# Patient Record
Sex: Male | Born: 1945
Health system: Southern US, Community
[De-identification: ages and names within clinical notes are randomized; demographics above are authoritative.]

## PROBLEM LIST (undated history)

## (undated) DIAGNOSIS — R251 Tremor, unspecified: Secondary | ICD-10-CM

## (undated) DIAGNOSIS — M1990 Unspecified osteoarthritis, unspecified site: Secondary | ICD-10-CM

## (undated) DIAGNOSIS — I10 Essential (primary) hypertension: Secondary | ICD-10-CM

## (undated) DIAGNOSIS — N400 Enlarged prostate without lower urinary tract symptoms: Secondary | ICD-10-CM

## (undated) DIAGNOSIS — G4733 Obstructive sleep apnea (adult) (pediatric): Secondary | ICD-10-CM

## (undated) DIAGNOSIS — K649 Unspecified hemorrhoids: Secondary | ICD-10-CM

## (undated) DIAGNOSIS — E782 Mixed hyperlipidemia: Secondary | ICD-10-CM

## (undated) DIAGNOSIS — I480 Paroxysmal atrial fibrillation: Secondary | ICD-10-CM

## (undated) DIAGNOSIS — N529 Male erectile dysfunction, unspecified: Secondary | ICD-10-CM

## (undated) DIAGNOSIS — C439 Malignant melanoma of skin, unspecified: Secondary | ICD-10-CM

## (undated) DIAGNOSIS — M1710 Unilateral primary osteoarthritis, unspecified knee: Secondary | ICD-10-CM

## (undated) DIAGNOSIS — I2510 Atherosclerotic heart disease of native coronary artery without angina pectoris: Secondary | ICD-10-CM

## (undated) DIAGNOSIS — E7800 Pure hypercholesterolemia, unspecified: Secondary | ICD-10-CM

## (undated) HISTORY — DX: Tremor, unspecified: R25.1

## (undated) HISTORY — PX: CARDIAC CATHETERIZATION: SHX172

## (undated) HISTORY — DX: Paroxysmal atrial fibrillation: I48.0

## (undated) HISTORY — DX: Male erectile dysfunction, unspecified: N52.9

## (undated) HISTORY — DX: Benign prostatic hyperplasia without lower urinary tract symptoms: N40.0

## (undated) HISTORY — DX: Essential (primary) hypertension: I10

## (undated) HISTORY — DX: Unspecified osteoarthritis, unspecified site: M19.90

## (undated) HISTORY — DX: Pure hypercholesterolemia, unspecified: E78.00

## (undated) HISTORY — PX: HYDROCELE EXCISION / REPAIR: SUR1145

## (undated) HISTORY — PX: OTHER SURGICAL HISTORY: SHX169

## (undated) HISTORY — DX: Malignant melanoma of skin, unspecified: C43.9

## (undated) HISTORY — DX: Unilateral primary osteoarthritis, unspecified knee: M17.10

## (undated) HISTORY — DX: Obstructive sleep apnea (adult) (pediatric): G47.33

## (undated) HISTORY — PX: HEMORROIDECTOMY: SUR656

## (undated) HISTORY — PX: CATARACT EXTRACTION, BILATERAL: SHX1313

## (undated) HISTORY — PX: LASIK: SHX215

## (undated) HISTORY — DX: Atherosclerotic heart disease of native coronary artery without angina pectoris: I25.10

## (undated) HISTORY — DX: Mixed hyperlipidemia: E78.2

---

## 1999-09-17 ENCOUNTER — Encounter: Payer: Self-pay | Admitting: Endocrinology

## 1999-09-17 ENCOUNTER — Ambulatory Visit (HOSPITAL_COMMUNITY): Admission: RE | Admit: 1999-09-17 | Discharge: 1999-09-17 | Payer: Self-pay | Admitting: Endocrinology

## 2003-06-21 ENCOUNTER — Ambulatory Visit (HOSPITAL_COMMUNITY): Admission: RE | Admit: 2003-06-21 | Discharge: 2003-06-21 | Payer: Self-pay | Admitting: Family Medicine

## 2005-05-31 ENCOUNTER — Ambulatory Visit (HOSPITAL_COMMUNITY): Admission: RE | Admit: 2005-05-31 | Discharge: 2005-05-31 | Payer: Self-pay | Admitting: Orthopedic Surgery

## 2005-08-02 ENCOUNTER — Ambulatory Visit: Payer: Self-pay | Admitting: Internal Medicine

## 2005-09-04 ENCOUNTER — Ambulatory Visit: Payer: Self-pay | Admitting: Internal Medicine

## 2005-10-05 ENCOUNTER — Emergency Department (HOSPITAL_COMMUNITY): Admission: EM | Admit: 2005-10-05 | Discharge: 2005-10-05 | Payer: Self-pay | Admitting: Emergency Medicine

## 2006-09-01 ENCOUNTER — Ambulatory Visit: Payer: Self-pay | Admitting: Pulmonary Disease

## 2007-05-22 ENCOUNTER — Ambulatory Visit (HOSPITAL_BASED_OUTPATIENT_CLINIC_OR_DEPARTMENT_OTHER): Admission: RE | Admit: 2007-05-22 | Discharge: 2007-05-22 | Payer: Self-pay | Admitting: Urology

## 2008-10-13 ENCOUNTER — Telehealth: Payer: Self-pay | Admitting: Gastroenterology

## 2009-03-20 ENCOUNTER — Encounter: Admission: RE | Admit: 2009-03-20 | Discharge: 2009-03-20 | Payer: Self-pay | Admitting: Family Medicine

## 2010-12-25 NOTE — Op Note (Signed)
NAME:  Cody Hodge, Cody Hodge NO.:  [id number]   MEDICAL RECORD NO.:  [id number]          PATIENT TYPE:  AMB   LOCATION:  [facility]                         FACILITY:  [facility]   PHYSICIAN:  Excell Seltzer. Annabell Howells, M.D.    DATE OF BIRTH:  04/27/1946   DATE OF PROCEDURE:  05/22/2007  DATE OF DISCHARGE:                               OPERATIVE REPORT   PROCEDURE:  Right hydrocelectomy.   PREOPERATIVE DIAGNOSIS:  Right hydrocele.   POSTOPERATIVE DIAGNOSIS:  Right hydrocele.   SURGEON:  Excell Seltzer. Annabell Howells, M.D.   ANESTHESIA:  MAC and local.   COMPLICATIONS:  None.   INDICATIONS:  Mr. Franta is a 65 year old white male with history of  symptomatic right hydrocele who has elected to undergo hydrocelectomy.   FINDINGS AND PROCEDURE:  The patient was taken to the operating room  where he was placed on the table in the supine position.  His scrotum  was shaved.  He was given IV sedation.  He was prepped with Betadine  solution and draped in the usual sterile fashion.  A cord block was  performed with 10 mL of 0.25% percent Marcaine and 1% lidocaine in a 3:2  mix.  He then underwent infiltration of the right anterior scrotal skin  in the area of incision with an additional 7 mL of lidocaine.   An oblique right scrotal incision was made with a knife.  This was  carried down through the dartos with the Bovie.  The testicle within the  hydrocele sac was then delivered from the wound.  An additional 3-5 mL  of local anesthetic was used to complete the cord block.  He then  underwent drainage of the hydrocele fluid and reduction of the sac with  imbrication behind the testicle in a water bottle fashion.  Once the  hydrocele had been managed, there was a small cystic lesion on the head  of the epididymis which I aspirated.  This is likely just a very small  epididymal cyst or spermatocele.  The testicle was then returned to the  scrotum after hemostasis was assured.  A quarter-inch Penrose drain  was  placed through a separate stab wound in the dependent portion of the  right hemiscrotum after injecting the drain site with local anesthetic.  A 3-0 chromic had been used for the hydrocele imbrication.  A 3-0  chromic was then used to close the dartos muscle in a running fashion.  The skin was closed with interrupted vertical mattress 3-0 chromic  sutures.  The drain had been secured with a towel clip after initial  placement.  Great care was taken to avoid entrapment of the drain with  suture closure of the wound.  The towel clip was removed.  The drain  was trimmed to an appropriate length.  A dressing of 4 x 4s followed by  fluff, Kerlix, and a scrotal support was applied.   The patient was taken to the recovery room in stable condition.  There  were no complications.      Excell Seltzer. Annabell Howells, M.D.  Electronically Signed     JJW/MEDQ  D:  05/22/2007  T:  05/22/2007  Job:  [id number]

## 2010-12-28 NOTE — Assessment & Plan Note (Signed)
HEALTHCARE                             PULMONARY OFFICE NOTE   NAME:Cody Hodge, Cody Hodge                     MRN:          [id number]  DATE:09/01/2006                            DOB:          March 27, 1946    HISTORY OF PRESENT ILLNESS:  The patient is a 65 year old gentleman who  I have been asked to see regarding possible sleep apnea. The patient has  recently undergone nocturnal polysomnogram where he was found to have 21  total obstructive events with an apnea/hypopnea index of 5 per hour. He  did have increased severity during REM, but again, it was a very small  number of total events. The patient states that he typically goes to bed  between 11:30 and 12 and gets up at 7 a.m. to start his day. He feels  very rested. His wife states that he snores only on his back with rare  pauses. He denies any choking arousals. The patient works as an Pensions consultant  and does not feel that his alertness level during the day is a problem.  He has no difficulties except on rare occasions with dozing while  watching TV or movies. He has no significant sleepiness issues with  driving short distances but occasionally will have fuzziness with long  distances. He does complain of post-nasal drip with a globus sensation  in his throat and constant throat clearing. Of note, his weight is up  about 6 pounds over the last few years.   PAST MEDICAL HISTORY:  1. Paroxysmal atrial fibrillation.  2. History of allergic rhinitis.  3. History of arthritis.   CURRENT MEDICATIONS:  1. Aspirin 81 mg daily.  2. Warfarin in varying doses.  3. Flecainide 150 mg b.i.d.  4. Lipitor 10 mg daily.  5. Proscar 5 mg daily.  6. Cardizem CD 120 daily.  7. Also, various over-the-counter vitamins and nutritional      supplements.   The patient has no known drug allergies.   SOCIAL HISTORY:  He is married and has children. He does not smoke.   FAMILY HISTORY:  Is noncontributory.   REVIEW  OF SYSTEMS:  As per history of present illness. Also see patient  intake form documented in the chart.   PHYSICAL EXAMINATION:  In general, he is a well-developed male in no  acute distress. Blood pressure is 124/70, pulse is 60, temperature is  98. Weight is 208 pounds, 6 foot tall. O2 saturation is 98% on room air.  HEENT:  Pupils are equal, round, and reactive to light and  accommodation. Extraocular muscles are intact. Nares showed mild  narrowing but patent. Oropharynx does show mild elongation of the soft  palate and uvula.  NECK:  Is supple without JVD or lymphadenopathy. There is no palpable  thyromegaly.  CHEST:  Totally clear to auscultation.  CARDIAC EXAM:  Reveals regular rate and rhythm with a controlled  ventricular response.  ABDOMEN:  Is soft and nontender with good bowel sounds.  GENITAL EXAM, RECTAL EXAM, BREAST EXAM:  Were not done and not  indicated.  LOWER EXTREMITIES:  Are without edema. Pulses are  intact distally.  NEUROLOGICAL:  Alert and oriented with no obvious motor deficits.   IMPRESSION:  Very mild obstructive sleep apnea documented by nocturnal  polysomnography. It is a little worse during REM. At this point in time,  the patient has minimal symptoms, and I believe that given the extremely  mild nature of his sleep apnea this is of little health consequence. I  do think the patient would benefit from modest weight loss and possibly  positional therapy. Certainly, if he felt that this was interfering with  his quality of life or if his wife being disturbed to a point by his  snoring, we could always consider CPAP, and I would be happy to arrange  that. At this point in time, the patient would like to try conservative  measures.   PLAN:  1. Modest weight loss.  2. Stay off back as much as possible during sleep.  3. The patient will call if he wishes to try CPAP for his symptoms and      will monitor the severity of his symptoms. Also, if his atrial       fibrillation becomes more difficult to control, we could consider a      trial of CPAP since recent studies do indicate improved rate      control in those with sleep-disordered breathing after treatment.     Barbaraann Share, MD,FCCP  Electronically Signed    KMC/MedQ  DD: 11/06/2006  DT: 11/06/2006  Job #: [id number]   cc:   Armanda Magic, M.D.  Elsworth Soho, M.D.

## 2011-05-23 LAB — BASIC METABOLIC PANEL
BUN: 26 — ABNORMAL HIGH
CO2: 27
Calcium: 9
Glucose, Bld: 95
Potassium: 4.2
Sodium: 139

## 2011-05-23 LAB — POCT HEMOGLOBIN-HEMACUE: Operator id: 268271

## 2011-05-23 LAB — APTT: aPTT: 26

## 2011-09-24 DIAGNOSIS — L570 Actinic keratosis: Secondary | ICD-10-CM | POA: Diagnosis not present

## 2011-09-24 DIAGNOSIS — D1801 Hemangioma of skin and subcutaneous tissue: Secondary | ICD-10-CM | POA: Diagnosis not present

## 2011-09-24 DIAGNOSIS — E7800 Pure hypercholesterolemia, unspecified: Secondary | ICD-10-CM | POA: Diagnosis not present

## 2011-09-24 DIAGNOSIS — Z85820 Personal history of malignant melanoma of skin: Secondary | ICD-10-CM | POA: Diagnosis not present

## 2012-02-06 DIAGNOSIS — N401 Enlarged prostate with lower urinary tract symptoms: Secondary | ICD-10-CM | POA: Diagnosis not present

## 2012-02-19 DIAGNOSIS — N529 Male erectile dysfunction, unspecified: Secondary | ICD-10-CM | POA: Diagnosis not present

## 2012-02-19 DIAGNOSIS — N401 Enlarged prostate with lower urinary tract symptoms: Secondary | ICD-10-CM | POA: Diagnosis not present

## 2012-02-19 DIAGNOSIS — R972 Elevated prostate specific antigen [PSA]: Secondary | ICD-10-CM | POA: Diagnosis not present

## 2012-04-08 DIAGNOSIS — Z85820 Personal history of malignant melanoma of skin: Secondary | ICD-10-CM | POA: Diagnosis not present

## 2012-04-08 DIAGNOSIS — D485 Neoplasm of uncertain behavior of skin: Secondary | ICD-10-CM | POA: Diagnosis not present

## 2012-04-08 DIAGNOSIS — L570 Actinic keratosis: Secondary | ICD-10-CM | POA: Diagnosis not present

## 2012-04-20 DIAGNOSIS — Z23 Encounter for immunization: Secondary | ICD-10-CM | POA: Diagnosis not present

## 2012-04-20 DIAGNOSIS — J309 Allergic rhinitis, unspecified: Secondary | ICD-10-CM | POA: Diagnosis not present

## 2012-04-20 DIAGNOSIS — I4891 Unspecified atrial fibrillation: Secondary | ICD-10-CM | POA: Diagnosis not present

## 2012-04-20 DIAGNOSIS — E782 Mixed hyperlipidemia: Secondary | ICD-10-CM | POA: Diagnosis not present

## 2012-04-20 DIAGNOSIS — Z0000 Encounter for general adult medical examination without abnormal findings: Secondary | ICD-10-CM | POA: Diagnosis not present

## 2012-07-06 DIAGNOSIS — I4891 Unspecified atrial fibrillation: Secondary | ICD-10-CM | POA: Diagnosis not present

## 2012-07-06 DIAGNOSIS — I10 Essential (primary) hypertension: Secondary | ICD-10-CM | POA: Diagnosis not present

## 2012-07-20 DIAGNOSIS — I4891 Unspecified atrial fibrillation: Secondary | ICD-10-CM | POA: Diagnosis not present

## 2012-07-20 DIAGNOSIS — I10 Essential (primary) hypertension: Secondary | ICD-10-CM | POA: Diagnosis not present

## 2012-09-10 DIAGNOSIS — H5200 Hypermetropia, unspecified eye: Secondary | ICD-10-CM | POA: Diagnosis not present

## 2012-09-10 DIAGNOSIS — H52229 Regular astigmatism, unspecified eye: Secondary | ICD-10-CM | POA: Diagnosis not present

## 2012-09-10 DIAGNOSIS — Z4009 Encounter for prophylactic removal of other organ: Secondary | ICD-10-CM | POA: Diagnosis not present

## 2012-09-10 DIAGNOSIS — H2589 Other age-related cataract: Secondary | ICD-10-CM | POA: Diagnosis not present

## 2012-09-15 DIAGNOSIS — R05 Cough: Secondary | ICD-10-CM | POA: Diagnosis not present

## 2012-10-07 DIAGNOSIS — D235 Other benign neoplasm of skin of trunk: Secondary | ICD-10-CM | POA: Diagnosis not present

## 2012-10-07 DIAGNOSIS — L723 Sebaceous cyst: Secondary | ICD-10-CM | POA: Diagnosis not present

## 2012-10-28 DIAGNOSIS — R05 Cough: Secondary | ICD-10-CM | POA: Diagnosis not present

## 2012-10-28 DIAGNOSIS — R634 Abnormal weight loss: Secondary | ICD-10-CM | POA: Diagnosis not present

## 2012-12-02 DIAGNOSIS — K529 Noninfective gastroenteritis and colitis, unspecified: Secondary | ICD-10-CM | POA: Diagnosis not present

## 2012-12-02 DIAGNOSIS — R634 Abnormal weight loss: Secondary | ICD-10-CM | POA: Diagnosis not present

## 2012-12-09 DIAGNOSIS — D126 Benign neoplasm of colon, unspecified: Secondary | ICD-10-CM | POA: Diagnosis not present

## 2012-12-09 DIAGNOSIS — K5730 Diverticulosis of large intestine without perforation or abscess without bleeding: Secondary | ICD-10-CM | POA: Diagnosis not present

## 2012-12-09 DIAGNOSIS — R197 Diarrhea, unspecified: Secondary | ICD-10-CM | POA: Diagnosis not present

## 2012-12-09 DIAGNOSIS — R634 Abnormal weight loss: Secondary | ICD-10-CM | POA: Diagnosis not present

## 2012-12-09 DIAGNOSIS — D1330 Benign neoplasm of unspecified part of small intestine: Secondary | ICD-10-CM | POA: Diagnosis not present

## 2012-12-09 DIAGNOSIS — K921 Melena: Secondary | ICD-10-CM | POA: Diagnosis not present

## 2013-02-17 DIAGNOSIS — N401 Enlarged prostate with lower urinary tract symptoms: Secondary | ICD-10-CM | POA: Diagnosis not present

## 2013-02-24 DIAGNOSIS — R972 Elevated prostate specific antigen [PSA]: Secondary | ICD-10-CM | POA: Diagnosis not present

## 2013-02-24 DIAGNOSIS — N401 Enlarged prostate with lower urinary tract symptoms: Secondary | ICD-10-CM | POA: Diagnosis not present

## 2013-02-24 DIAGNOSIS — N529 Male erectile dysfunction, unspecified: Secondary | ICD-10-CM | POA: Diagnosis not present

## 2013-04-07 DIAGNOSIS — L819 Disorder of pigmentation, unspecified: Secondary | ICD-10-CM | POA: Diagnosis not present

## 2013-04-07 DIAGNOSIS — D485 Neoplasm of uncertain behavior of skin: Secondary | ICD-10-CM | POA: Diagnosis not present

## 2013-04-07 DIAGNOSIS — Z85820 Personal history of malignant melanoma of skin: Secondary | ICD-10-CM | POA: Diagnosis not present

## 2013-04-07 DIAGNOSIS — D235 Other benign neoplasm of skin of trunk: Secondary | ICD-10-CM | POA: Diagnosis not present

## 2013-05-03 DIAGNOSIS — I10 Essential (primary) hypertension: Secondary | ICD-10-CM | POA: Diagnosis not present

## 2013-05-03 DIAGNOSIS — Z0000 Encounter for general adult medical examination without abnormal findings: Secondary | ICD-10-CM | POA: Diagnosis not present

## 2013-05-03 DIAGNOSIS — L851 Acquired keratosis [keratoderma] palmaris et plantaris: Secondary | ICD-10-CM | POA: Diagnosis not present

## 2013-05-03 DIAGNOSIS — E7800 Pure hypercholesterolemia, unspecified: Secondary | ICD-10-CM | POA: Diagnosis not present

## 2013-05-03 DIAGNOSIS — Z23 Encounter for immunization: Secondary | ICD-10-CM | POA: Diagnosis not present

## 2013-05-03 DIAGNOSIS — I4891 Unspecified atrial fibrillation: Secondary | ICD-10-CM | POA: Diagnosis not present

## 2013-06-09 DIAGNOSIS — Z23 Encounter for immunization: Secondary | ICD-10-CM | POA: Diagnosis not present

## 2013-06-29 ENCOUNTER — Encounter: Payer: Self-pay | Admitting: *Deleted

## 2013-06-29 ENCOUNTER — Encounter: Payer: Self-pay | Admitting: Cardiology

## 2013-06-30 ENCOUNTER — Encounter: Payer: Self-pay | Admitting: Cardiology

## 2013-06-30 ENCOUNTER — Ambulatory Visit: Payer: Self-pay | Admitting: Cardiology

## 2013-06-30 DIAGNOSIS — E782 Mixed hyperlipidemia: Secondary | ICD-10-CM | POA: Insufficient documentation

## 2013-06-30 DIAGNOSIS — I480 Paroxysmal atrial fibrillation: Secondary | ICD-10-CM | POA: Insufficient documentation

## 2013-06-30 DIAGNOSIS — G4733 Obstructive sleep apnea (adult) (pediatric): Secondary | ICD-10-CM | POA: Insufficient documentation

## 2013-06-30 DIAGNOSIS — I10 Essential (primary) hypertension: Secondary | ICD-10-CM | POA: Insufficient documentation

## 2013-07-14 ENCOUNTER — Encounter: Payer: Self-pay | Admitting: Interventional Cardiology

## 2013-07-14 ENCOUNTER — Ambulatory Visit (INDEPENDENT_AMBULATORY_CARE_PROVIDER_SITE_OTHER): Payer: Medicare Other | Admitting: Cardiology

## 2013-07-14 ENCOUNTER — Encounter (INDEPENDENT_AMBULATORY_CARE_PROVIDER_SITE_OTHER): Payer: Self-pay

## 2013-07-14 ENCOUNTER — Encounter: Payer: Self-pay | Admitting: Cardiology

## 2013-07-14 VITALS — BP 126/78 | HR 61 | Ht 70.0 in | Wt 198.0 lb

## 2013-07-14 DIAGNOSIS — I10 Essential (primary) hypertension: Secondary | ICD-10-CM

## 2013-07-14 DIAGNOSIS — I480 Paroxysmal atrial fibrillation: Secondary | ICD-10-CM

## 2013-07-14 DIAGNOSIS — I4891 Unspecified atrial fibrillation: Secondary | ICD-10-CM

## 2013-07-14 MED ORDER — DILTIAZEM HCL ER COATED BEADS 120 MG PO CP24: 120.0000 mg | ORAL_CAPSULE | Freq: Every day | ORAL | Status: DC

## 2013-07-14 MED ORDER — FLECAINIDE ACETATE 150 MG PO TABS: 150.0000 mg | ORAL_TABLET | Freq: Two times a day (BID) | ORAL | Status: DC

## 2013-07-14 NOTE — Progress Notes (Signed)
  [address] [place], [place]  [address] Phone: [phone] Fax:  [phone]  Date:  07/14/2013   ID:  Emrick Hensch, DOB [birth year], MRN [id number]  PCP:  Lupe Carney, MD  Cardiologist:  Armanda Magic, MD     History of Present Illness: Cody Hodge is a 67 y.o. male with a history of PAF who presents today for followup.  He is doing well.  He denies any chest pain, SOB, DOE, LE edem, dizziness, palpitations or syncope.     Wt Readings from Last 3 Encounters:  07/14/13 198 lb (89.812 kg)     Past Medical History  Diagnosis Date  . Tremor   . Knee arthropathy   . OSA (obstructive sleep apnea)     mild to moderate refused CPAP  . Mixed hyperlipidemia   . Hypercholesteremia   . Melanoma   . PAF (paroxysmal atrial fibrillation)     CHADS VASC score 1  . Arthritis   . ED (erectile dysfunction)   . HTN (hypertension)   . BPH (benign prostatic hyperplasia)   . Melanoma     Current Outpatient Prescriptions  Medication Sig Dispense Refill  . aspirin EC 325 MG tablet Take 325 mg by mouth daily.      Marland Kitchen diltiazem (CARDIZEM CD) 120 MG 24 hr capsule Take 120 mg by mouth daily.       . finasteride (PROSCAR) 5 MG tablet Take 5 mg by mouth daily.       . flecainide (TAMBOCOR) 150 MG tablet Take 150 mg by mouth 2 (two) times daily.       . pravastatin (PRAVACHOL) 40 MG tablet Take 40 mg by mouth daily.        No current facility-administered medications for this visit.    Allergies:    Allergies  Allergen Reactions  . Lipitor [Atorvastatin]     Social History:  The patient  reports that he has never smoked. He does not have any smokeless tobacco history on file. He reports that he drinks alcohol.   Family History:  The patient's family history includes Dementia in his mother; Heart attack in his father; Heart disease in his father.   ROS:  Please see the history of present illness.      All other systems reviewed and negative.   PHYSICAL EXAM: VS:  BP  126/78  Pulse 61  Ht 5\' 10"  (1.778 m)  Wt 198 lb (89.812 kg)  BMI 28.41 kg/m2 Well nourished, well developed, in no acute distress HEENT: normal Neck: no JVD Cardiac:  normal S1, S2; RRR; no murmur Lungs:  clear to auscultation bilaterally, no wheezing, rhonchi or rales Abd: soft, nontender, no hepatomegaly Ext: no edema Skin: warm and dry Neuro:  CNs 2-12 intact, no focal abnormalities noted  EKG:  NSR at 61bpm with nonspecific IVCD - no changed compared to EKG 2013  ASSESSMENT AND PLAN:  1. PAF maintaining NSR on Flecainide/Cardizem/ASA which he will continue 2. Borderline HTN well controlled on Cardizem  Followup with me in 1 year  Signed, Armanda Magic, MD 07/14/2013 11:24 AM

## 2013-07-14 NOTE — Patient Instructions (Signed)
Your physician recommends that you continue on your current medications as directed. Please refer to the Current Medication list given to you today.  We sent your refills into your pharmacy. They should be ready for pick up today.  Your physician wants you to follow-up in: 1 year with Dr Sherlyn Lick will receive a reminder letter in the mail two months in advance. If you don't receive a letter, please call our office to schedule the follow-up appointment.

## 2013-08-16 DIAGNOSIS — D3130 Benign neoplasm of unspecified choroid: Secondary | ICD-10-CM | POA: Diagnosis not present

## 2013-08-16 DIAGNOSIS — H2510 Age-related nuclear cataract, unspecified eye: Secondary | ICD-10-CM | POA: Diagnosis not present

## 2013-08-16 DIAGNOSIS — H43819 Vitreous degeneration, unspecified eye: Secondary | ICD-10-CM | POA: Diagnosis not present

## 2013-08-16 DIAGNOSIS — Q159 Congenital malformation of eye, unspecified: Secondary | ICD-10-CM | POA: Diagnosis not present

## 2013-08-16 DIAGNOSIS — M25519 Pain in unspecified shoulder: Secondary | ICD-10-CM | POA: Diagnosis not present

## 2013-09-03 DIAGNOSIS — M542 Cervicalgia: Secondary | ICD-10-CM | POA: Diagnosis not present

## 2013-10-06 DIAGNOSIS — L570 Actinic keratosis: Secondary | ICD-10-CM | POA: Diagnosis not present

## 2013-10-06 DIAGNOSIS — D235 Other benign neoplasm of skin of trunk: Secondary | ICD-10-CM | POA: Diagnosis not present

## 2013-10-06 DIAGNOSIS — Z85820 Personal history of malignant melanoma of skin: Secondary | ICD-10-CM | POA: Diagnosis not present

## 2013-12-14 DIAGNOSIS — M25569 Pain in unspecified knee: Secondary | ICD-10-CM | POA: Diagnosis not present

## 2013-12-21 ENCOUNTER — Telehealth: Payer: Self-pay | Admitting: Cardiology

## 2013-12-21 NOTE — Telephone Encounter (Signed)
Received request from Nurse fax box, documents faxed for surgical clearance. To: Raliegh Ip Fax number: [phone] Attention: 5.12.15/kdm

## 2014-02-02 DIAGNOSIS — Z9889 Other specified postprocedural states: Secondary | ICD-10-CM | POA: Diagnosis not present

## 2014-02-02 DIAGNOSIS — H2510 Age-related nuclear cataract, unspecified eye: Secondary | ICD-10-CM | POA: Diagnosis not present

## 2014-02-02 DIAGNOSIS — H04129 Dry eye syndrome of unspecified lacrimal gland: Secondary | ICD-10-CM | POA: Diagnosis not present

## 2014-04-19 DIAGNOSIS — M25569 Pain in unspecified knee: Secondary | ICD-10-CM | POA: Diagnosis not present

## 2014-05-05 DIAGNOSIS — K649 Unspecified hemorrhoids: Secondary | ICD-10-CM | POA: Diagnosis not present

## 2014-05-05 DIAGNOSIS — I10 Essential (primary) hypertension: Secondary | ICD-10-CM | POA: Diagnosis not present

## 2014-05-05 DIAGNOSIS — E7800 Pure hypercholesterolemia, unspecified: Secondary | ICD-10-CM | POA: Diagnosis not present

## 2014-05-05 DIAGNOSIS — Z0000 Encounter for general adult medical examination without abnormal findings: Secondary | ICD-10-CM | POA: Diagnosis not present

## 2014-05-05 DIAGNOSIS — Z23 Encounter for immunization: Secondary | ICD-10-CM | POA: Diagnosis not present

## 2014-05-05 DIAGNOSIS — M1710 Unilateral primary osteoarthritis, unspecified knee: Secondary | ICD-10-CM | POA: Diagnosis not present

## 2014-05-05 DIAGNOSIS — I4891 Unspecified atrial fibrillation: Secondary | ICD-10-CM | POA: Diagnosis not present

## 2014-05-18 DIAGNOSIS — Z08 Encounter for follow-up examination after completed treatment for malignant neoplasm: Secondary | ICD-10-CM | POA: Diagnosis not present

## 2014-05-18 DIAGNOSIS — D225 Melanocytic nevi of trunk: Secondary | ICD-10-CM | POA: Diagnosis not present

## 2014-05-18 DIAGNOSIS — Z85820 Personal history of malignant melanoma of skin: Secondary | ICD-10-CM | POA: Diagnosis not present

## 2014-05-18 DIAGNOSIS — L814 Other melanin hyperpigmentation: Secondary | ICD-10-CM | POA: Diagnosis not present

## 2014-05-31 DIAGNOSIS — R972 Elevated prostate specific antigen [PSA]: Secondary | ICD-10-CM | POA: Diagnosis not present

## 2014-06-08 DIAGNOSIS — N5201 Erectile dysfunction due to arterial insufficiency: Secondary | ICD-10-CM | POA: Diagnosis not present

## 2014-06-08 DIAGNOSIS — N401 Enlarged prostate with lower urinary tract symptoms: Secondary | ICD-10-CM | POA: Diagnosis not present

## 2014-06-08 DIAGNOSIS — R351 Nocturia: Secondary | ICD-10-CM | POA: Diagnosis not present

## 2014-06-11 ENCOUNTER — Other Ambulatory Visit: Payer: Self-pay

## 2014-06-11 MED ORDER — FLECAINIDE ACETATE 150 MG PO TABS: 150.0000 mg | ORAL_TABLET | Freq: Two times a day (BID) | ORAL | Status: DC

## 2014-06-15 ENCOUNTER — Encounter: Payer: Self-pay | Admitting: Cardiology

## 2014-09-12 DIAGNOSIS — I2510 Atherosclerotic heart disease of native coronary artery without angina pectoris: Secondary | ICD-10-CM

## 2014-09-12 HISTORY — DX: Atherosclerotic heart disease of native coronary artery without angina pectoris: I25.10

## 2014-09-13 NOTE — Progress Notes (Addendum)
Cardiology Office Note   Date:  09/16/2014   ID:  Cody Hodge, DOB 1946/01/09, MRN [id number]  PCP:  Donnie Coffin, MD  Cardiologist:   Sueanne Margarita, MD   Chief Complaint  Patient presents with  . Atrial Fibrillation  . Hypertension      History of Present Illness: Cody Hodge is a 69 y.o. male with a history of PAF who presents today for followup. He is doing well. He denies any chest pain, LE edema, dizziness, palpitations or syncope. He has noticed some DOE when walking but is able to still play tennis.      Past Medical History  Diagnosis Date  . Tremor   . Knee arthropathy   . OSA (obstructive sleep apnea)     mild to moderate refused CPAP  . Mixed hyperlipidemia   . Hypercholesteremia   . Melanoma   . PAF (paroxysmal atrial fibrillation)     CHADS VASC score 2 (age>65 and HTN)  . Arthritis   . ED (erectile dysfunction)   . HTN (hypertension)   . BPH (benign prostatic hyperplasia)   . Melanoma     Past Surgical History  Procedure Laterality Date  . Left and right arthroscopy    . Lasik    . Hydrocele excision / repair    . Left forearm melanoma       Current Outpatient Prescriptions  Medication Sig Dispense Refill  . aspirin EC 325 MG tablet Take 325 mg by mouth daily.    Marland Kitchen diltiazem (CARDIZEM CD) 120 MG 24 hr capsule Take 1 capsule (120 mg total) by mouth daily. 90 capsule 3  . finasteride (PROSCAR) 5 MG tablet Take 5 mg by mouth daily.     . flecainide (TAMBOCOR) 150 MG tablet Take 1 tablet (150 mg total) by mouth 2 (two) times daily. 180 tablet 0  . pravastatin (PRAVACHOL) 40 MG tablet Take 40 mg by mouth daily.     . tamsulosin (FLOMAX) 0.4 MG CAPS capsule Take 0.4 mg by mouth daily.     No current facility-administered medications for this visit.    Allergies:   Lipitor    Social History:  The patient  reports that he has never smoked. He does not have any smokeless tobacco history on file. He reports that he drinks alcohol.    Family History:  The patient's family history includes Dementia in his mother; Heart attack in his father; Heart disease in his father.    ROS:  Please see the history of present illness.   Otherwise, review of systems are positive for none.   All other systems are reviewed and negative.    PHYSICAL EXAM: VS:  BP 130/88 mmHg  Pulse 68  Ht 5' 10.5" (1.791 m)  Wt 200 lb 6.4 oz (90.901 kg)  BMI 28.34 kg/m2 , BMI Body mass index is 28.34 kg/(m^2). GEN: Well nourished, well developed, in no acute distress HEENT: normal Neck: no JVD, carotid bruits, or masses Cardiac: RRR; no murmurs, rubs, or gallops,no edema  Respiratory:  clear to auscultation bilaterally, normal work of breathing GI: soft, nontender, nondistended, + BS MS: no deformity or atrophy Skin: warm and dry, no rash Neuro:  Strength and sensation are intact Psych: euthymic mood, full affect   EKG:  EKG is ordered today. The ekg ordered today demonstrates NSR with first degress AV block with nonspecific IVCD with QRS duration 123msec (increased from 140msec in 2014)   Recent Labs: No results found for requested labs within last  365 days.    Lipid Panel No results found for: CHOL, TRIG, HDL, CHOLHDL, VLDL, LDLCALC, LDLDIRECT    Wt Readings from Last 3 Encounters:  09/14/14 200 lb 6.4 oz (90.901 kg)  07/14/13 198 lb (89.812 kg)        ASSESSMENT AND PLAN:  1. PAF maintaining NSR on Flecainide/Cardizem/ASA which he will continue.  I will check a peak Flecainide level this am since he just took his dose at 8am. He has had PAF for years and has not had breakthrough in years.  His CHADS2VASC score is now 2 (HTN and Age>65) but has not been anticoagulated in the past since since his score was only 1 and has not had any breakthrough.  I have counseled on the [facility] scoring and indication now for anticoagulation given his age.  I will start him on Xarelto 20mg  daily.  Check NOAC pane.  2. HTN well controlled on  Cardizem 3. SOB which is new for him and occurs with exertional activities. I will get a stress myoview to rule out ischemia.    Current medicines are reviewed at length with the patient today.  The patient does not have concerns regarding medicines.  The following changes have been made:  Added Xarelto 20mg  daily  Labs/ tests ordered today include: None     Disposition:   FU with me in 6 months   Signed, Sueanne Margarita, MD  09/16/2014 2:38 PM    [facility] [address], [place], [place]  [address] Phone: [phone]; Fax: [phone]

## 2014-09-14 ENCOUNTER — Encounter: Payer: Self-pay | Admitting: Cardiology

## 2014-09-14 ENCOUNTER — Ambulatory Visit (INDEPENDENT_AMBULATORY_CARE_PROVIDER_SITE_OTHER): Payer: Medicare Other | Admitting: Cardiology

## 2014-09-14 VITALS — BP 130/88 | HR 68 | Ht 70.5 in | Wt 200.4 lb

## 2014-09-14 DIAGNOSIS — I10 Essential (primary) hypertension: Secondary | ICD-10-CM | POA: Diagnosis not present

## 2014-09-14 DIAGNOSIS — I480 Paroxysmal atrial fibrillation: Secondary | ICD-10-CM

## 2014-09-14 DIAGNOSIS — R0602 Shortness of breath: Secondary | ICD-10-CM

## 2014-09-14 NOTE — Patient Instructions (Signed)
Your physician recommends that you continue on your current medications as directed. Please refer to the Current Medication list given to you today.  Your physician recommends that you have lab work TODAY (peak Flecainide level).  Dr. Radford Pax recommends you have a NUCLEAR STRESS TEST.  Your physician wants you to follow-up in: 6 months with Dr. Radford Pax. You will receive a reminder letter in the mail two months in advance. If you don't receive a letter, please call our office to schedule the follow-up appointment.

## 2014-09-16 ENCOUNTER — Telehealth: Payer: Self-pay | Admitting: Cardiology

## 2014-09-16 ENCOUNTER — Encounter: Payer: Self-pay | Admitting: Cardiology

## 2014-09-16 ENCOUNTER — Encounter: Payer: Self-pay | Admitting: Internal Medicine

## 2014-09-16 DIAGNOSIS — I10 Essential (primary) hypertension: Secondary | ICD-10-CM

## 2014-09-16 DIAGNOSIS — I480 Paroxysmal atrial fibrillation: Secondary | ICD-10-CM

## 2014-09-16 MED ORDER — RIVAROXABAN 20 MG PO TABS: 20.0000 mg | ORAL_TABLET | Freq: Every day | ORAL | Status: DC

## 2014-09-16 NOTE — Addendum Note (Signed)
Addended by: Harland German A on: 09/16/2014 05:49 PM   Modules accepted: Orders

## 2014-09-16 NOTE — Addendum Note (Signed)
Addended by: Fransico Him R on: 09/16/2014 02:40 PM   Modules accepted: Miquel Dunn

## 2014-09-16 NOTE — Telephone Encounter (Signed)
Dr. Radford Pax instructed patient to START Xarelto 20 mg daily.  Samples and prescription card placed at front desk for patient pick-up on Tuesday. Called [facility] to obtain most recent BMET and CBC. CMP from 05/05/2014 received. No CBC.

## 2014-09-16 NOTE — Telephone Encounter (Signed)
Discussed with patient today the fact that given his age he now has a CHADS2VASC score of 2 (age>65 and HTN ) and therefore should be placed on longterm anticoagulation for PAF.  Please start Xarelto 20mg  daily and leave sample and free prescription card and given to patient on Tuesday when he comes in for his stress test.  Risks and benefits of NOAC agent discussed with patient at length and he agrees to start. Please get a copy of his most recent BMET and CBC from PCP

## 2014-09-16 NOTE — Addendum Note (Signed)
Addended by: Fransico Him R on: 09/16/2014 02:51 PM   Modules accepted: SmartSet

## 2014-09-19 NOTE — Addendum Note (Signed)
Addended by: Harland German A on: 09/19/2014 05:48 PM   Modules accepted: Orders

## 2014-09-19 NOTE — Telephone Encounter (Signed)
Informed patient that not all necessary lab work was done at his PCP. CBC is scheduled for tomorrow. Patient agrees with treatment plan.

## 2014-09-20 ENCOUNTER — Other Ambulatory Visit (INDEPENDENT_AMBULATORY_CARE_PROVIDER_SITE_OTHER): Payer: Medicare Other | Admitting: *Deleted

## 2014-09-20 ENCOUNTER — Ambulatory Visit (HOSPITAL_COMMUNITY): Payer: Medicare Other | Attending: Cardiology | Admitting: Radiology

## 2014-09-20 DIAGNOSIS — I480 Paroxysmal atrial fibrillation: Secondary | ICD-10-CM | POA: Diagnosis not present

## 2014-09-20 DIAGNOSIS — I10 Essential (primary) hypertension: Secondary | ICD-10-CM

## 2014-09-20 DIAGNOSIS — R0602 Shortness of breath: Secondary | ICD-10-CM

## 2014-09-20 LAB — CBC WITH DIFFERENTIAL/PLATELET
BASOS ABS: 0 10*3/uL (ref 0.0–0.1)
BASOS PCT: 0.6 % (ref 0.0–3.0)
Eosinophils Absolute: 0.3 10*3/uL (ref 0.0–0.7)
Eosinophils Relative: 3.9 % (ref 0.0–5.0)
HCT: 44.3 % (ref 39.0–52.0)
Hemoglobin: 14.7 g/dL (ref 13.0–17.0)
LYMPHS PCT: 24.9 % (ref 12.0–46.0)
Lymphs Abs: 1.7 10*3/uL (ref 0.7–4.0)
MCHC: 33.2 g/dL (ref 30.0–36.0)
MCV: 83.2 fl (ref 78.0–100.0)
MONOS PCT: 8.3 % (ref 3.0–12.0)
Monocytes Absolute: 0.6 10*3/uL (ref 0.1–1.0)
NEUTROS ABS: 4.4 10*3/uL (ref 1.4–7.7)
NEUTROS PCT: 62.3 % (ref 43.0–77.0)
Platelets: 259 10*3/uL (ref 150.0–400.0)
RBC: 5.32 Mil/uL (ref 4.22–5.81)
RDW: 13.8 % (ref 11.5–15.5)
WBC: 7 10*3/uL (ref 4.0–10.5)

## 2014-09-20 MED ORDER — TECHNETIUM TC 99M SESTAMIBI GENERIC - CARDIOLITE
30.0000 | Freq: Once | INTRAVENOUS | Status: AC | PRN
  Administered 2014-09-20: 30 via INTRAVENOUS

## 2014-09-20 MED ORDER — TECHNETIUM TC 99M SESTAMIBI GENERIC - CARDIOLITE
10.0000 | Freq: Once | INTRAVENOUS | Status: AC | PRN
  Administered 2014-09-20: 10 via INTRAVENOUS

## 2014-09-20 NOTE — Progress Notes (Signed)
[facility] [address] [place], [place] [address] [phone]    Cardiology Nuclear Med Study  Cody Hodge is a 69 y.o. male     MRN : [id number]     DOB: May 10, 1946  Procedure Date: 09/20/2014  Nuclear Med Background Indication for Stress Test:  Evaluation for Ischemia History:  MPI ~10 yrs ago (normal per pt.) Cardiac Risk Factors: Hypertension, Lipids and Atrial Fib  Symptoms:  DOE   Nuclear Pre-Procedure Caffeine/Decaff Intake:  None NPO After: 8:00pm   Lungs:  clear O2 Sat: 94% on room air. IV 0.9% NS with Angio Cath:  22g  IV Site: R Hand  IV Started by:  Matilde Haymaker, RN  Chest Size (in):  42 Cup Size: n/a  Height: 5\' 10"  (1.778 m)  Weight:  198 lb (89.812 kg)  BMI:  Body mass index is 28.41 kg/(m^2). Tech Comments:  n/a    Nuclear Med Study 1 or 2 day study: 1 day  Stress Test Type:  Stress  Reading MD: n/a  Order Authorizing Provider:  Tressia Miners Turner,MD  Resting Radionuclide: Technetium 99m Sestamibi  Resting Radionuclide Dose: 11.0 mCi   Stress Radionuclide:  Technetium 9m Sestamibi  Stress Radionuclide Dose: 33.0 mCi           Stress Protocol Rest HR: 70 Stress HR: 139  Rest BP: 132/81 Stress BP: 158/71  Exercise Time (min): 8:00 METS: 10.1   Predicted Max HR: 151 bpm % Max HR: 92.05 bpm Rate Pressure Product: 21962   Dose of Adenosine (mg):  n/a Dose of Lexiscan: n/a mg  Dose of Atropine (mg): n/a Dose of Dobutamine: n/a mcg/kg/min (at max HR)  Stress Test Technologist: Glade Lloyd, BS-ES  Nuclear Technologist:  Earl Many, CNMT     Rest Procedure:  Myocardial perfusion imaging was performed at rest 45 minutes following the intravenous administration of Technetium 48m Sestamibi. Rest ECG: Normal sinus rhythm. Interventricular conduction delay. Mild nonspecific ST flattening  Stress Procedure:  The patient exercised on the treadmill utilizing the Bruce Protocol for 8:00 minutes. The patient stopped due  to fatigue and denied any chest pain.  Technetium 73m Sestamibi was injected at peak exercise and myocardial perfusion imaging was performed after a brief delay. Stress ECG: There is greater than 1 mm ST depression with stress. This resolved rapidly in recovery  QPS Raw Data Images:  Normal; no motion artifact; normal heart/lung ratio. Stress Images:  Small area of mild decreased activity affecting the apical anterior segment and apical lateral segment. Rest Images:  Normal homogeneous uptake in all areas of the myocardium. Subtraction (SDS):  There may be slight reversibility in the apical anterior segment and apical lateral segment. Transient Ischemic Dilatation (Normal <1.22):  0.91 Lung/Heart Ratio (Normal <0.45):  0.36  Quantitative Gated Spect Images QGS EDV:  96 ml QGS ESV:  35 ml  Impression Exercise Capacity:  Good exercise capacity. BP Response:  Normal blood pressure response. Clinical Symptoms:  No significant symptoms noted. ECG Impression:  There is 1 mm of ST depression during stress. This disappears immediately in recovery Comparison with Prior Nuclear Study: No images to compare  Overall Impression:  This is a low risk scan. There is no definite scar. There is question of the possibility of slight ischemia near the apex. I am not convinced that this is a significant finding. There is some ST depression with exercise, but I believe this is a nonspecific finding.  LV Ejection Fraction: 64%.  LV  Wall Motion:  Normal Wall Motion   Daryel November, MD

## 2014-09-21 ENCOUNTER — Other Ambulatory Visit: Payer: Self-pay | Admitting: Cardiology

## 2014-09-21 LAB — FLECAINIDE LEVEL: FLECAINIDE: 0.81 ug/mL (ref 0.20–1.00)

## 2014-09-22 ENCOUNTER — Other Ambulatory Visit: Payer: Self-pay

## 2014-09-22 ENCOUNTER — Encounter: Payer: Self-pay | Admitting: Cardiology

## 2014-09-22 MED ORDER — FLECAINIDE ACETATE 150 MG PO TABS: 150.0000 mg | ORAL_TABLET | Freq: Two times a day (BID) | ORAL | Status: DC

## 2014-09-23 ENCOUNTER — Other Ambulatory Visit: Payer: Self-pay

## 2014-09-23 MED ORDER — DILTIAZEM HCL ER COATED BEADS 120 MG PO CP24: 120.0000 mg | ORAL_CAPSULE | Freq: Every day | ORAL | Status: DC

## 2014-09-26 ENCOUNTER — Ambulatory Visit (INDEPENDENT_AMBULATORY_CARE_PROVIDER_SITE_OTHER): Payer: Medicare Other | Admitting: Physician Assistant

## 2014-09-26 ENCOUNTER — Ambulatory Visit: Payer: No Typology Code available for payment source | Admitting: Physician Assistant

## 2014-09-26 ENCOUNTER — Other Ambulatory Visit: Payer: Self-pay | Admitting: Physician Assistant

## 2014-09-26 ENCOUNTER — Encounter: Payer: Self-pay | Admitting: Physician Assistant

## 2014-09-26 VITALS — BP 136/84 | HR 81 | Ht 71.0 in | Wt 204.0 lb

## 2014-09-26 DIAGNOSIS — I480 Paroxysmal atrial fibrillation: Secondary | ICD-10-CM

## 2014-09-26 DIAGNOSIS — E782 Mixed hyperlipidemia: Secondary | ICD-10-CM

## 2014-09-26 DIAGNOSIS — R0609 Other forms of dyspnea: Secondary | ICD-10-CM | POA: Insufficient documentation

## 2014-09-26 DIAGNOSIS — Z01818 Encounter for other preprocedural examination: Secondary | ICD-10-CM | POA: Diagnosis not present

## 2014-09-26 DIAGNOSIS — Z8249 Family history of ischemic heart disease and other diseases of the circulatory system: Secondary | ICD-10-CM | POA: Diagnosis not present

## 2014-09-26 LAB — COMPREHENSIVE METABOLIC PANEL WITH GFR
ALT: 47 U/L (ref 0–53)
AST: 29 U/L (ref 0–37)
Albumin: 4.3 g/dL (ref 3.5–5.2)
Alkaline Phosphatase: 110 U/L (ref 39–117)
BUN: 23 mg/dL (ref 6–23)
CO2: 31 meq/L (ref 19–32)
Calcium: 9.5 mg/dL (ref 8.4–10.5)
Chloride: 101 meq/L (ref 96–112)
Creatinine, Ser: 1.12 mg/dL (ref 0.40–1.50)
GFR: 69 mL/min
Glucose, Bld: 93 mg/dL (ref 70–99)
Potassium: 4 meq/L (ref 3.5–5.1)
Sodium: 138 meq/L (ref 135–145)
Total Bilirubin: 0.5 mg/dL (ref 0.2–1.2)
Total Protein: 7.3 g/dL (ref 6.0–8.3)

## 2014-09-26 LAB — PROTIME-INR
INR: 1.3 ratio — ABNORMAL HIGH (ref 0.8–1.0)
Prothrombin Time: 14.1 s — ABNORMAL HIGH (ref 9.6–13.1)

## 2014-09-26 LAB — APTT: aPTT: 30.6 s (ref 23.4–32.7)

## 2014-09-26 NOTE — Assessment & Plan Note (Signed)
Patient is on Pravachol

## 2014-09-26 NOTE — Assessment & Plan Note (Signed)
Patient has maintained normal sinus rhythm on flecainide. Recent level was just checked and stable.

## 2014-09-26 NOTE — Assessment & Plan Note (Signed)
Patient has new dyspnea on exertion while playing tennis. He had ST depression during stress testing and question of inferolateral ischemia on Myoview. Dr. Radford Pax recommends cardiac catheterization to rule out underlying heart disease. The patient is on flecainide for suppression of PAF. We need to rule out CAD. Risk and benefits of cardiac catheterization discussed in detail including bleeding at cath site, heart attack and CVA. Patient is agreeable to proceed.

## 2014-09-26 NOTE — Progress Notes (Signed)
Cardiology Office Note   Date:  09/26/2014   ID:  Cody Hodge, DOB November 21, 1945, MRN [id number]  PCP:  Donnie Coffin, MD  Cardiologist:  Fransico Him, MD  Chief Complaint:    History of Present Illness: Cody Hodge is a 69 y.o. male who presents for workup for cardiac catheterization after recent abnormal stress Myoview. He has a patient Dr. Radford Pax who has history of paroxysmal atrial fibrillation maintaining normal sinus rhythm on flecainide/Cardizem. His CHADS2VASC score is now 2 and he was started on Xarelto recently by Dr. Radford Pax. He was complaining of dyspnea on exertion and she ordered a stress Myoview. He had 1 mm ST depression with stress that resolved rapidly in recovery. There was no definite scar. There was a questionable possibility of slight ischemia near the apex. This was read as a low risk scan EF 64% normal wall motion. Dr. Radford Pax reviewed the stress nuclear study personally and was concerned because of the ST depression during stress and possibility of inferolateral ischemia. She recommends a left heart catheterization because of his dyspnea on exertion with playing tennis. He is also on flecainide for suppression of PAF and we need to rule out CAD.  Patient comes in today to discuss cardiac catheterization. He has multiple questions that were answered. I discussed the risk and benefits of cardiac catheterization and he is agreeable to proceed. We will draw blood work today. He had a CBC and flecainide level last week so we will not repeat those. He has no change in his symptoms. He complains only of dyspnea on exertion while playing tennis.    Past Medical History  Diagnosis Date  . Tremor   . Knee arthropathy   . OSA (obstructive sleep apnea)     mild to moderate refused CPAP  . Mixed hyperlipidemia   . Hypercholesteremia   . Melanoma   . PAF (paroxysmal atrial fibrillation)     CHADS VASC score 2 (age>65 and HTN)  . Arthritis   . ED (erectile dysfunction)    . HTN (hypertension)   . BPH (benign prostatic hyperplasia)   . Melanoma     Past Surgical History  Procedure Laterality Date  . Left and right arthroscopy    . Lasik    . Hydrocele excision / repair    . Left forearm melanoma       Current Outpatient Prescriptions  Medication Sig Dispense Refill  . aspirin EC 325 MG tablet Take 325 mg by mouth daily.    Marland Kitchen diltiazem (CARDIZEM CD) 120 MG 24 hr capsule Take 1 capsule (120 mg total) by mouth daily. 90 capsule 3  . finasteride (PROSCAR) 5 MG tablet Take 5 mg by mouth daily.     . flecainide (TAMBOCOR) 150 MG tablet Take 1 tablet (150 mg total) by mouth 2 (two) times daily. 180 tablet 2  . pravastatin (PRAVACHOL) 40 MG tablet Take 40 mg by mouth daily.     . rivaroxaban (XARELTO) 20 MG TABS tablet Take 1 tablet (20 mg total) by mouth daily with supper. 30 tablet 6  . tamsulosin (FLOMAX) 0.4 MG CAPS capsule Take 0.4 mg by mouth daily.     No current facility-administered medications for this visit.    Allergies:   Lipitor    Social History:  The patient  reports that he has never smoked. He does not have any smokeless tobacco history on file. He reports that he drinks alcohol.   Family History:  The patient's family history includes Dementia in his  mother; Heart attack in his father; Heart disease in his father.   ROS:  Please see the history of present illness.   Otherwise, review of systems are positive for none.   All other systems are reviewed and negative.    PHYSICAL EXAM: BP 136/84 mmHg  Pulse 81  Ht 5\' 11"  (1.803 m)  Wt 204 lb (92.534 kg)  BMI 28.46 kg/m2  SpO2 95% GEN: Well nourished, well developed, in no acute distress HEENT: normal Neck: no JVD, carotid bruits, or masses Cardiac: RRR; positive S4, 1/6 systolic murmur at the left sternal border, no rubs, no edema,   Respiratory:  clear to auscultation bilaterally, normal work of breathing GI: soft, nontender, nondistended, + BS MS: no deformity or  atrophy Extremities: without cyanosis, clubbing, edema, good distal pulses bilaterally.  Skin: warm and dry, no rash Neuro:  Strength and sensation are intact Psych: euthymic mood, full affect   EKG:  EKG is not ordered today.    Recent Labs: 09/20/2014: Hemoglobin 14.7; Platelets 259.0    Lipid Panel No results found for: CHOL, TRIG, HDL, CHOLHDL, VLDL, LDLCALC, LDLDIRECT    Wt Readings from Last 3 Encounters:  09/20/14 198 lb (89.812 kg)  09/14/14 200 lb 6.4 oz (90.901 kg)  07/14/13 198 lb (89.812 kg)      Other studies Reviewed: Additional studies/ records that were reviewed today include:  Exercise myoview:09/21/14:  Review of the above records demonstrates:   Stress ECG: There is greater than 1 mm ST depression with stress. This resolved rapidly in recovery  This is a low risk scan. There is no definite scar. There is question of the possibility of slight ischemia near the apex. I am not convinced that this is a significant finding. There is some ST depression with exercise, but I believe this is a nonspecific finding.  LV Ejection Fraction: 64%.  LV Wall Motion:  Normal Wall Motion  Notes Recorded by Sueanne Margarita, MD on 09/23/2014 at 3:23 PM I have personally reviewed the nuclear stress test. Please let patient know that stress test showed ST depression during stress testing and inferolateral ischemia. I think he needs to be set up for left heart cath given his abnormal stress test and new DOE with playing tennis.  He is on flecainide so need to rule out CAD he is to continue on this for suppression of PAF   Dyspnea on exertion Patient has new dyspnea on exertion while playing tennis. He had ST depression during stress testing and question of inferolateral ischemia on Myoview. Dr. Radford Pax recommends cardiac catheterization to rule out underlying heart disease. The patient is on flecainide for suppression of PAF. We need to rule out CAD. Risk and benefits of cardiac  catheterization discussed in detail including bleeding at cath site, heart attack and CVA. Patient is agreeable to proceed.   PAF (paroxysmal atrial fibrillation) Patient has maintained normal sinus rhythm on flecainide. Recent level was just checked and stable.   Mixed hyperlipidemia Patient is on Pravachol      Tressa Busman  09/26/2014 11:58 AM    [address], [place], [place]  [address] Phone: [phone]; Fax: [phone]

## 2014-09-26 NOTE — Patient Instructions (Addendum)
Will obtain labs today and call you with the results (cmet/pt/inr/ptt)  Your physician recommends that you continue on your current medications as directed. Please refer to the Current Medication list given to you today.  Your physician recommends that you schedule a follow-up appointment in: 1 month with Dr Mallie Snooks are scheduled for a cardiac catheterization on 09/28/14 with Dr. Burt Knack Go to [facility] [place] on 09/28/14 at 8:00 am.  Enter thru the [facility] entrance A No food or drink after midnight on 09/27/14 You may take your medications with a sip of water on the day of your procedure.   You will need someone to stay with you for 24 hours after the procedure

## 2014-09-27 ENCOUNTER — Encounter: Payer: Self-pay | Admitting: *Deleted

## 2014-09-28 ENCOUNTER — Ambulatory Visit (HOSPITAL_COMMUNITY)
Admission: RE | Admit: 2014-09-28 | Discharge: 2014-09-28 | Disposition: A | Discharge status: Home / Self Care | Payer: Medicare Other | Source: Ambulatory Visit | Attending: Cardiovascular Disease | Admitting: Cardiovascular Disease

## 2014-10-03 ENCOUNTER — Telehealth: Payer: Self-pay | Admitting: *Deleted

## 2014-10-03 NOTE — Telephone Encounter (Signed)
-----   Message from Imogene Burn, PA-C sent at 09/27/2014  7:54 AM EST ----- Labs stable for cath

## 2014-10-04 NOTE — Telephone Encounter (Signed)
New message    Patient calling would like to discuss cath procedure that schedule on Thursday.

## 2014-10-05 NOTE — Telephone Encounter (Signed)
Patient wants to know what it means to "prepare to stay for one night at the hospital." He was informed that he can bring items to stay for one night, such as a toothbrush or other personal items.  Patient grateful for callback.

## 2014-10-06 ENCOUNTER — Encounter (HOSPITAL_COMMUNITY): Payer: Self-pay | Admitting: Interventional Cardiology

## 2014-10-06 ENCOUNTER — Encounter (HOSPITAL_COMMUNITY)
Admission: RE | Disposition: A | Discharge status: Home / Self Care | Payer: Self-pay | Source: Ambulatory Visit | Attending: Interventional Cardiology

## 2014-10-06 ENCOUNTER — Ambulatory Visit (HOSPITAL_COMMUNITY)
Admission: RE | Admit: 2014-10-06 | Discharge: 2014-10-06 | Disposition: A | Discharge status: Home / Self Care | Payer: Medicare Other | Source: Ambulatory Visit | Attending: Interventional Cardiology | Admitting: Interventional Cardiology

## 2014-10-06 DIAGNOSIS — I2510 Atherosclerotic heart disease of native coronary artery without angina pectoris: Secondary | ICD-10-CM

## 2014-10-06 DIAGNOSIS — Z888 Allergy status to other drugs, medicaments and biological substances status: Secondary | ICD-10-CM | POA: Diagnosis not present

## 2014-10-06 DIAGNOSIS — N400 Enlarged prostate without lower urinary tract symptoms: Secondary | ICD-10-CM | POA: Insufficient documentation

## 2014-10-06 DIAGNOSIS — E782 Mixed hyperlipidemia: Secondary | ICD-10-CM | POA: Insufficient documentation

## 2014-10-06 DIAGNOSIS — G4733 Obstructive sleep apnea (adult) (pediatric): Secondary | ICD-10-CM | POA: Diagnosis not present

## 2014-10-06 DIAGNOSIS — Z85820 Personal history of malignant melanoma of skin: Secondary | ICD-10-CM | POA: Insufficient documentation

## 2014-10-06 DIAGNOSIS — R0609 Other forms of dyspnea: Secondary | ICD-10-CM

## 2014-10-06 DIAGNOSIS — Z7982 Long term (current) use of aspirin: Secondary | ICD-10-CM | POA: Diagnosis not present

## 2014-10-06 DIAGNOSIS — Z8249 Family history of ischemic heart disease and other diseases of the circulatory system: Secondary | ICD-10-CM | POA: Insufficient documentation

## 2014-10-06 DIAGNOSIS — R9439 Abnormal result of other cardiovascular function study: Secondary | ICD-10-CM | POA: Diagnosis present

## 2014-10-06 DIAGNOSIS — I480 Paroxysmal atrial fibrillation: Secondary | ICD-10-CM | POA: Diagnosis not present

## 2014-10-06 DIAGNOSIS — I10 Essential (primary) hypertension: Secondary | ICD-10-CM | POA: Diagnosis not present

## 2014-10-06 HISTORY — PX: LEFT HEART CATHETERIZATION WITH CORONARY ANGIOGRAM: SHX5451

## 2014-10-06 SURGERY — LEFT HEART CATHETERIZATION WITH CORONARY ANGIOGRAM

## 2014-10-06 MED ORDER — VERAPAMIL HCL 2.5 MG/ML IV SOLN
INTRAVENOUS | Status: AC
  Filled 2014-10-06: qty 2

## 2014-10-06 MED ORDER — NITROGLYCERIN 1 MG/10 ML FOR IR/CATH LAB
INTRA_ARTERIAL | Status: AC
  Filled 2014-10-06: qty 10

## 2014-10-06 MED ORDER — ONDANSETRON HCL 4 MG/2ML IJ SOLN: 4.0000 mg | Freq: Four times a day (QID) | INTRAMUSCULAR | Status: DC | PRN

## 2014-10-06 MED ORDER — HEPARIN (PORCINE) IN NACL 2-0.9 UNIT/ML-% IJ SOLN
INTRAMUSCULAR | Status: AC
  Filled 2014-10-06: qty 1500

## 2014-10-06 MED ORDER — SODIUM CHLORIDE 0.9 % IJ SOLN: 3.0000 mL | Freq: Two times a day (BID) | INTRAMUSCULAR | Status: DC

## 2014-10-06 MED ORDER — SODIUM CHLORIDE 0.9 % IV SOLN
INTRAVENOUS | Status: DC
  Administered 2014-10-06: 10:00:00 via INTRAVENOUS

## 2014-10-06 MED ORDER — LIDOCAINE HCL (PF) 1 % IJ SOLN
INTRAMUSCULAR | Status: AC
  Filled 2014-10-06: qty 30

## 2014-10-06 MED ORDER — SODIUM CHLORIDE 0.9 % IV SOLN
INTRAVENOUS | Status: DC
  Administered 2014-10-06: 11:00:00 via INTRAVENOUS

## 2014-10-06 MED ORDER — HEPARIN SODIUM (PORCINE) 1000 UNIT/ML IJ SOLN
INTRAMUSCULAR | Status: AC
  Filled 2014-10-06: qty 1

## 2014-10-06 MED ORDER — SODIUM CHLORIDE 0.9 % IJ SOLN: 3.0000 mL | INTRAMUSCULAR | Status: DC | PRN

## 2014-10-06 MED ORDER — ACETAMINOPHEN 325 MG PO TABS: 650.0000 mg | ORAL_TABLET | ORAL | Status: DC | PRN

## 2014-10-06 MED ORDER — SODIUM CHLORIDE 0.9 % IV SOLN: 250.0000 mL | INTRAVENOUS | Status: DC | PRN

## 2014-10-06 NOTE — Interval H&P Note (Signed)
Cath Lab Visit (complete for each Cath Lab visit)  Clinical Evaluation Leading to the Procedure:   ACS: No.  Non-ACS:    Anginal Classification: No Symptoms  Anti-ischemic medical therapy: No Therapy  Non-Invasive Test Results: Low-risk stress test findings: cardiac mortality <1%/year  Prior CABG: No previous CABG      History and Physical Interval Note:  10/06/2014 9:06 AM  Marjorie Smolder  has presented today for surgery, with the diagnosis of abnormal nuc  The various methods of treatment have been discussed with the patient and family. After consideration of risks, benefits and other options for treatment, the patient has consented to  Procedure(s): LEFT HEART CATHETERIZATION WITH CORONARY ANGIOGRAM (N/A) as a surgical intervention .  The patient's history has been reviewed, patient examined, no change in status, stable for surgery.  I have reviewed the patient's chart and labs.  Questions were answered to the patient's satisfaction.     Sinclair Grooms

## 2014-10-06 NOTE — CV Procedure (Signed)
     Left Heart Catheterization with Coronary Angiography  Report  Oberon Hehir  69 y.o.  male [birth year]  Procedure Date: 10/06/2014 Referring Physician: Fransico Him, M.D. Primary Cardiologist: Fransico Him, M.D.  INDICATIONS: Paroxysmal atrial fibrillation in a patient with a low risk myocardial perfusion study for concern about the possibility of coronary artery disease and the use of flutter cannot. This study is being done to exclude CAD.  PROCEDURE: 1. Left heart catheterization; 2. Coronary angiography; 3. Left ventriculography  CONSENT:  The risks, benefits, and details of the procedure were explained in detail to the patient. Risks including death, stroke, heart attack, kidney injury, allergy, limb ischemia, bleeding and radiation injury were discussed.  The patient verbalized understanding and wanted to proceed.  Informed written consent was obtained.  PROCEDURE TECHNIQUE:  After Xylocaine anesthesia a 5 French Slender sheath was placed in the right radial artery with an angiocath and the modified Seldinger technique.  Coronary angiography was done using a 5 F JR4 and JL 3.5 cm diagnostic catheter.  Left ventriculography was done using the JR 4 catheter and hand injection.   Review of the digital images demonstrated irregularities in the circumflex and mid LAD. The first diagonal contained 50-70% ostial narrowing. No significant obstructive coronary disease was noted.   CONTRAST:  Total of 75 cc.  COMPLICATIONS:  None   HEMODYNAMICS:  Aortic pressure 152/75 mmHg; LV pressure 152/1 mmHg; LVEDP 12 mmHg  ANGIOGRAPHIC DATA:   The left main coronary artery is normal.  The left anterior descending artery is widely patent in the proximal segment. After the second diagonal there is calcification and luminal irregularities are noted. In the very distal portion of the mid LAD there is an eccentric 40-50% narrowing. The first diagonal contains eccentric ostial 50-70%  narrowing.  The left circumflex artery is is relatively small. He gives origin to a single obtuse marginal that branches of the inferolateral wall. The proximal portion of the obtuse marginal contains eccentric segmental 30-40% narrowing. The ostium of the circumflex contains a 30-40% narrowing..  The right coronary artery is dominant, and widely patent. Minimal luminal irregularities are noted in the midsegment.Marland Kitchen   LEFT VENTRICULOGRAM:  Left ventricular angiogram was done in the 30 RAO projection and revealed normal LV cavity size and contractility. EF 55%.   IMPRESSIONS:  1. Minimal calcification throughout the left coronary distribution. 2. 50% mid to distal LAD, 50-70% ostial first diagonal, and 40% ostial and mid circumflex. The patient has no hemodynamically significant coronary obstructive disease. The myocardial perfusion study was low risk. The findings on both studies are compatible. 3. Normal left ventricular function   RECOMMENDATION:  It appears that is safe for the patient he used lacunae for control of atrial fibrillation.Marland Kitchen

## 2014-10-06 NOTE — Discharge Instructions (Signed)
Radial Site Care °Refer to this sheet in the next few weeks. These instructions provide you with information on caring for yourself after your procedure. Your caregiver may also give you more specific instructions. Your treatment has been planned according to current medical practices, but problems sometimes occur. Call your caregiver if you have any problems or questions after your procedure. °HOME CARE INSTRUCTIONS °· You may shower the day after the procedure. Remove the bandage (dressing) and gently wash the site with plain soap and water. Gently pat the site dry. °· Do not apply powder or lotion to the site. °· Do not submerge the affected site in water for 3 to 5 days. °· Inspect the site at least twice daily. °· Do not flex or bend the affected arm for 24 hours. °· No lifting over 5 pounds (2.3 kg) for 5 days after your procedure. °· Do not drive home if you are discharged the same day of the procedure. Have someone else drive you. °· You may drive 24 hours after the procedure unless otherwise instructed by your caregiver. °· Do not operate machinery or power tools for 24 hours. °· A responsible adult should be with you for the first 24 hours after you arrive home. °What to expect: °· Any bruising will usually fade within 1 to 2 weeks. °· Blood that collects in the tissue (hematoma) may be painful to the touch. It should usually decrease in size and tenderness within 1 to 2 weeks. °SEEK IMMEDIATE MEDICAL CARE IF: °· You have unusual pain at the radial site. °· You have redness, warmth, swelling, or pain at the radial site. °· You have drainage (other than a small amount of blood on the dressing). °· You have chills. °· You have a fever or persistent symptoms for more than 72 hours. °· You have a fever and your symptoms suddenly get worse. °· Your arm becomes pale, cool, tingly, or numb. °· You have heavy bleeding from the site. Hold pressure on the site. °Document Released: 08/31/2010 Document Revised:  10/21/2011 Document Reviewed: 08/31/2010 °ExitCare® Patient Information ©2015 [employer]. This information is not intended to replace advice given to you by your health care provider. Make sure you discuss any questions you have with your health care provider. ° °

## 2014-10-06 NOTE — H&P (View-Only) (Signed)
Cardiology Office Note   Date:  09/26/2014   ID:  Cody Hodge, DOB 14-Sep-1945, MRN [id number]  PCP:  Donnie Coffin, MD  Cardiologist:  Fransico Him, MD  Chief Complaint:    History of Present Illness: Cody Hodge is a 69 y.o. male who presents for workup for cardiac catheterization after recent abnormal stress Myoview. He has a patient Dr. Radford Pax who has history of paroxysmal atrial fibrillation maintaining normal sinus rhythm on flecainide/Cardizem. His CHADS2VASC score is now 2 and he was started on Xarelto recently by Dr. Radford Pax. He was complaining of dyspnea on exertion and she ordered a stress Myoview. He had 1 mm ST depression with stress that resolved rapidly in recovery. There was no definite scar. There was a questionable possibility of slight ischemia near the apex. This was read as a low risk scan EF 64% normal wall motion. Dr. Radford Pax reviewed the stress nuclear study personally and was concerned because of the ST depression during stress and possibility of inferolateral ischemia. She recommends a left heart catheterization because of his dyspnea on exertion with playing tennis. He is also on flecainide for suppression of PAF and we need to rule out CAD.  Patient comes in today to discuss cardiac catheterization. He has multiple questions that were answered. I discussed the risk and benefits of cardiac catheterization and he is agreeable to proceed. We will draw blood work today. He had a CBC and flecainide level last week so we will not repeat those. He has no change in his symptoms. He complains only of dyspnea on exertion while playing tennis.    Past Medical History  Diagnosis Date  . Tremor   . Knee arthropathy   . OSA (obstructive sleep apnea)     mild to moderate refused CPAP  . Mixed hyperlipidemia   . Hypercholesteremia   . Melanoma   . PAF (paroxysmal atrial fibrillation)     CHADS VASC score 2 (age>65 and HTN)  . Arthritis   . ED (erectile dysfunction)    . HTN (hypertension)   . BPH (benign prostatic hyperplasia)   . Melanoma     Past Surgical History  Procedure Laterality Date  . Left and right arthroscopy    . Lasik    . Hydrocele excision / repair    . Left forearm melanoma       Current Outpatient Prescriptions  Medication Sig Dispense Refill  . aspirin EC 325 MG tablet Take 325 mg by mouth daily.    Marland Kitchen diltiazem (CARDIZEM CD) 120 MG 24 hr capsule Take 1 capsule (120 mg total) by mouth daily. 90 capsule 3  . finasteride (PROSCAR) 5 MG tablet Take 5 mg by mouth daily.     . flecainide (TAMBOCOR) 150 MG tablet Take 1 tablet (150 mg total) by mouth 2 (two) times daily. 180 tablet 2  . pravastatin (PRAVACHOL) 40 MG tablet Take 40 mg by mouth daily.     . rivaroxaban (XARELTO) 20 MG TABS tablet Take 1 tablet (20 mg total) by mouth daily with supper. 30 tablet 6  . tamsulosin (FLOMAX) 0.4 MG CAPS capsule Take 0.4 mg by mouth daily.     No current facility-administered medications for this visit.    Allergies:   Lipitor    Social History:  The patient  reports that he has never smoked. He does not have any smokeless tobacco history on file. He reports that he drinks alcohol.   Family History:  The patient's family history includes Dementia in his  mother; Heart attack in his father; Heart disease in his father.   ROS:  Please see the history of present illness.   Otherwise, review of systems are positive for none.   All other systems are reviewed and negative.    PHYSICAL EXAM: BP 136/84 mmHg  Pulse 81  Ht 5\' 11"  (1.803 m)  Wt 204 lb (92.534 kg)  BMI 28.46 kg/m2  SpO2 95% GEN: Well nourished, well developed, in no acute distress HEENT: normal Neck: no JVD, carotid bruits, or masses Cardiac: RRR; positive S4, 1/6 systolic murmur at the left sternal border, no rubs, no edema,   Respiratory:  clear to auscultation bilaterally, normal work of breathing GI: soft, nontender, nondistended, + BS MS: no deformity or  atrophy Extremities: without cyanosis, clubbing, edema, good distal pulses bilaterally.  Skin: warm and dry, no rash Neuro:  Strength and sensation are intact Psych: euthymic mood, full affect   EKG:  EKG is not ordered today.    Recent Labs: 09/20/2014: Hemoglobin 14.7; Platelets 259.0    Lipid Panel No results found for: CHOL, TRIG, HDL, CHOLHDL, VLDL, LDLCALC, LDLDIRECT    Wt Readings from Last 3 Encounters:  09/20/14 198 lb (89.812 kg)  09/14/14 200 lb 6.4 oz (90.901 kg)  07/14/13 198 lb (89.812 kg)      Other studies Reviewed: Additional studies/ records that were reviewed today include:  Exercise myoview:09/21/14:  Review of the above records demonstrates:   Stress ECG: There is greater than 1 mm ST depression with stress. This resolved rapidly in recovery  This is a low risk scan. There is no definite scar. There is question of the possibility of slight ischemia near the apex. I am not convinced that this is a significant finding. There is some ST depression with exercise, but I believe this is a nonspecific finding.  LV Ejection Fraction: 64%.  LV Wall Motion:  Normal Wall Motion  Notes Recorded by Sueanne Margarita, MD on 09/23/2014 at 3:23 PM I have personally reviewed the nuclear stress test. Please let patient know that stress test showed ST depression during stress testing and inferolateral ischemia. I think he needs to be set up for left heart cath given his abnormal stress test and new DOE with playing tennis.  He is on flecainide so need to rule out CAD he is to continue on this for suppression of PAF   Dyspnea on exertion Patient has new dyspnea on exertion while playing tennis. He had ST depression during stress testing and question of inferolateral ischemia on Myoview. Dr. Radford Pax recommends cardiac catheterization to rule out underlying heart disease. The patient is on flecainide for suppression of PAF. We need to rule out CAD. Risk and benefits of cardiac  catheterization discussed in detail including bleeding at cath site, heart attack and CVA. Patient is agreeable to proceed.   PAF (paroxysmal atrial fibrillation) Patient has maintained normal sinus rhythm on flecainide. Recent level was just checked and stable.   Mixed hyperlipidemia Patient is on Pravachol      Tressa Busman  09/26/2014 11:58 AM    [address], [place], [place]  [address] Phone: [phone]; Fax: [phone]

## 2014-10-21 ENCOUNTER — Other Ambulatory Visit: Payer: Self-pay | Admitting: Cardiology

## 2014-11-15 ENCOUNTER — Encounter: Payer: Self-pay | Admitting: Cardiology

## 2014-11-15 NOTE — Progress Notes (Signed)
Cardiology Office Note   Date:  11/16/2014   ID:  Cody Hodge, DOB July 12, 1946, MRN [id number]  PCP:  Donnie Coffin, MD    Chief Complaint  Patient presents with  . Coronary Artery Disease  . Atrial Fibrillation  . Hyperlipidemia      History of Present Illness: Cody Hodge is a 68 y.o. male with a history of PAF, dyslipidemia and moderate ASCAD who presents today for followup. Since I saw him last he had a nuclear stress test done which was abnormal and underwent cath showing moderate nonobstructive ASCAD and is on medical management.  He is doing well. He denies any chest pain, LE edema, dizziness, palpitations or syncope.   Past Medical History  Diagnosis Date  . Tremor   . Knee arthropathy   . OSA (obstructive sleep apnea)     mild to moderate refused CPAP  . Mixed hyperlipidemia   . Hypercholesteremia   . Melanoma   . PAF (paroxysmal atrial fibrillation)     CHADS VASC score 2 (age>65 and HTN)  . Arthritis   . ED (erectile dysfunction)   . HTN (hypertension)   . BPH (benign prostatic hyperplasia)   . Melanoma   . Coronary artery disease 09/2014    50% mid to distal LAD, 50-70% ostial first diag, 40% ostial and mid left circ.    Past Surgical History  Procedure Laterality Date  . Left and right arthroscopy    . Lasik    . Hydrocele excision / repair    . Left forearm melanoma    . Left heart catheterization with coronary angiogram N/A 10/06/2014    Procedure: LEFT HEART CATHETERIZATION WITH CORONARY ANGIOGRAM;  Surgeon: Sinclair Grooms, MD;  Location: [facility] CATH [facility];  Service: Cardiovascular;  Laterality: N/A;     Current Outpatient Prescriptions  Medication Sig Dispense Refill  . Coenzyme Q10 (COQ10) 100 MG CAPS Take 100 mg by mouth at bedtime.    Marland Kitchen diltiazem (CARDIZEM CD) 120 MG 24 hr capsule Take 1 capsule (120 mg total) by mouth daily. 90 capsule 3  . finasteride (PROSCAR) 5 MG tablet Take 5 mg by mouth daily.     . flecainide (TAMBOCOR) 150  MG tablet Take 1 tablet (150 mg total) by mouth 2 (two) times daily. 180 tablet 2  . ibuprofen (ADVIL,MOTRIN) 200 MG tablet Take 400 mg by mouth 2 (two) times daily as needed (pain).    Marland Kitchen OVER THE COUNTER MEDICATION Take 1 tablet by mouth 2 (two) times daily. grapeseed oil 75 mg/resveratrol 30 mg/ vitamin c 100 mg    . pravastatin (PRAVACHOL) 40 MG tablet Take 40 mg by mouth at bedtime.     . tamsulosin (FLOMAX) 0.4 MG CAPS capsule Take 0.4 mg by mouth at bedtime.     . triamcinolone (NASACORT) 55 MCG/ACT AERO nasal inhaler Place 1 spray into both nostrils at bedtime.    . TURMERIC PO Take 400 mg by mouth 2 (two) times daily.    . rivaroxaban (XARELTO) 20 MG TABS tablet Take 1 tablet (20 mg total) by mouth daily with supper. (Patient not taking: Reported on 11/16/2014) 30 tablet 6   No current facility-administered medications for this visit.    Allergies:   Lipitor    Social History:  The patient  reports that he has never smoked. He does not have any smokeless tobacco history on file. He reports that he drinks alcohol.   Family History:  The patient's family history includes  Dementia in his mother; Heart attack in his father; Heart disease in his father.    ROS:  Please see the history of present illness.   Otherwise, review of systems are positive for none.   All other systems are reviewed and negative.    PHYSICAL EXAM: VS:  BP 122/82 mmHg  Pulse 81  Ht 5' 10.5" (1.791 m)  Wt 201 lb 12.8 oz (91.536 kg)  BMI 28.54 kg/m2  SpO2 95% , BMI Body mass index is 28.54 kg/(m^2). GEN: Well nourished, well developed, in no acute distress HEENT: normal Neck: no JVD, carotid bruits, or masses Cardiac: RRR; no murmurs, rubs, or gallops,no edema  Respiratory:  clear to auscultation bilaterally, normal work of breathing GI: soft, nontender, nondistended, + BS MS: no deformity or atrophy Skin: warm and dry, no rash Neuro:  Strength and sensation are intact Psych: euthymic mood, full  affect   EKG:  EKG is not ordered today.    Recent Labs: 09/20/2014: Hemoglobin 14.7; Platelets 259.0 09/26/2014: ALT 47; BUN 23; Creatinine 1.12; Potassium 4.0; Sodium 138    Lipid Panel No results found for: CHOL, TRIG, HDL, CHOLHDL, VLDL, LDLCALC, LDLDIRECT    Wt Readings from Last 3 Encounters:  11/16/14 201 lb 12.8 oz (91.536 kg)  09/28/14 195 lb (88.451 kg)  09/26/14 204 lb (92.534 kg)       ASSESSMENT AND PLAN:  1. PAF maintaining NSR on Cardizem/ASA which he will continue. His CHADS2VASC score is now 2 (HTN and Age>65) and is on Xarelto 20mg  daily but he stopped it due to nose bleeds and bleeding hemorrhoids. I have recommended that he see ENT to evaluate for source of bleeding so we can get him back on Xarelto.  I have asked him to see his GI MD again regarding the hemorrhoids and then restart the Xarelto when ok with ENT and GI.   2. HTN well controlled on Cardizem 3. SOB - nuclear stress test showed ischemia and he underwent cath showing minimal calcification throughout the left coronary distribution with 50% mid to distal LAD, 50-70% ostial first diag, 40% ostial and mid left circ.  He says today that he never said he had SOB playing tennis.  He says it occurs only with walking up moderate hills and thinks it is because he has gained weight. 4. Dyslipidemia - continue pravachol and check FLP and ALT from PCP   Current medicines are reviewed at length with the patient today.  The patient does not have concerns regarding medicines.  The following changes have been made:  no change  Labs/ tests ordered today include: see above assessment and plan  Orders Placed This Encounter  Procedures  . Ambulatory referral to ENT     Disposition:   FU with me in 6 months   Signed, Sueanne Margarita, MD  11/16/2014 9:49 AM    [facility] [address], [place], [place]  [address] Phone: [phone]; Fax: [phone]

## 2014-11-16 ENCOUNTER — Encounter: Payer: Self-pay | Admitting: Interventional Cardiology

## 2014-11-16 ENCOUNTER — Telehealth: Payer: Self-pay | Admitting: Interventional Cardiology

## 2014-11-16 ENCOUNTER — Encounter: Payer: Self-pay | Admitting: Cardiology

## 2014-11-16 ENCOUNTER — Ambulatory Visit (INDEPENDENT_AMBULATORY_CARE_PROVIDER_SITE_OTHER): Payer: Medicare Other | Admitting: Cardiology

## 2014-11-16 VITALS — BP 122/82 | HR 81 | Ht 70.5 in | Wt 201.8 lb

## 2014-11-16 DIAGNOSIS — I480 Paroxysmal atrial fibrillation: Secondary | ICD-10-CM

## 2014-11-16 DIAGNOSIS — I10 Essential (primary) hypertension: Secondary | ICD-10-CM | POA: Diagnosis not present

## 2014-11-16 DIAGNOSIS — R040 Epistaxis: Secondary | ICD-10-CM

## 2014-11-16 DIAGNOSIS — E782 Mixed hyperlipidemia: Secondary | ICD-10-CM

## 2014-11-16 DIAGNOSIS — G4733 Obstructive sleep apnea (adult) (pediatric): Secondary | ICD-10-CM | POA: Diagnosis not present

## 2014-11-16 NOTE — Telephone Encounter (Signed)
Discussed Dr. Thompson Caul results with patient and he understands.

## 2014-11-16 NOTE — Patient Instructions (Signed)
Your physician recommends that you continue on your current medications as directed. Please refer to the Current Medication list given to you today.  Please follow up with your ENT and GI doctors in regards to bleeding  Your physician wants you to follow-up in: 6 months with Dr. Radford Pax.  You will receive a reminder letter in the mail two months in advance. If you don't receive a letter, please call our office to schedule the follow-up appointment.

## 2014-11-16 NOTE — Telephone Encounter (Signed)
Traci, I reviewed Cody Hodge's angiogram and the report is accurate. He has noncritical coronary disease. I cannot remember what I told him, but I doubt that I said it was normal. If I did, it was a preliminary and should've been given final impression later. He has plaque buildup that requires no specific interventional or medical management.   Risk factor modification may be helpful.  Hank

## 2014-12-05 DIAGNOSIS — K648 Other hemorrhoids: Secondary | ICD-10-CM | POA: Diagnosis not present

## 2014-12-05 DIAGNOSIS — K921 Melena: Secondary | ICD-10-CM | POA: Diagnosis not present

## 2014-12-07 DIAGNOSIS — Z08 Encounter for follow-up examination after completed treatment for malignant neoplasm: Secondary | ICD-10-CM | POA: Diagnosis not present

## 2014-12-07 DIAGNOSIS — L909 Atrophic disorder of skin, unspecified: Secondary | ICD-10-CM | POA: Diagnosis not present

## 2014-12-07 DIAGNOSIS — Z85850 Personal history of malignant neoplasm of thyroid: Secondary | ICD-10-CM | POA: Diagnosis not present

## 2014-12-07 DIAGNOSIS — L821 Other seborrheic keratosis: Secondary | ICD-10-CM | POA: Diagnosis not present

## 2014-12-07 DIAGNOSIS — D225 Melanocytic nevi of trunk: Secondary | ICD-10-CM | POA: Diagnosis not present

## 2014-12-09 DIAGNOSIS — M1711 Unilateral primary osteoarthritis, right knee: Secondary | ICD-10-CM | POA: Diagnosis not present

## 2014-12-09 DIAGNOSIS — M1712 Unilateral primary osteoarthritis, left knee: Secondary | ICD-10-CM | POA: Diagnosis not present

## 2014-12-13 DIAGNOSIS — J301 Allergic rhinitis due to pollen: Secondary | ICD-10-CM | POA: Diagnosis not present

## 2014-12-13 DIAGNOSIS — R0982 Postnasal drip: Secondary | ICD-10-CM | POA: Diagnosis not present

## 2014-12-22 ENCOUNTER — Other Ambulatory Visit: Payer: Self-pay | Admitting: General Surgery

## 2014-12-22 DIAGNOSIS — K648 Other hemorrhoids: Secondary | ICD-10-CM | POA: Diagnosis not present

## 2014-12-22 NOTE — H&P (Signed)
Cody Hodge 12/22/2014 11:17 AM Location: [facility] Patient #: [id number] DOB: [birth year] Married / Language: Cody Hodge / Race: White Male History of Present Illness Cody Hodge; 05/04/3006 11:50 AM) Patient words: hems.  The patient is a 69 year old male who presents with hemorrhoids. Patient presents to the office with rectal bleeding and prolapsing internal hemorrhoids. He states that for years he has had trouble with internal hemorrhoids prolapsing with bowel movements which she has to reduce back inside. He reports bleeding on a semiregular basis approximately every 2 weeks. He denies any history of constipation. He has occasional episodes where he has to strain to have a bowel movement. He uses prunes on a daily basis to help manage this. His last colonoscopy was April 2014. This showed bleeding external and internal hemorrhoids with some diverticulosis in the sigmoid colon. Other Problems Cody Lorenzo, LPN; 02/01/6332 54:56 AM) Arthritis Atrial Fibrillation Enlarged Prostate Melanoma  Past Surgical History Cody Lorenzo, LPN; 2/56/3893 73:42 AM) Knee Surgery Bilateral. Oral Surgery  Diagnostic Studies History Cody Lorenzo, LPN; 8/76/8115 72:62 AM) Colonoscopy within last year  Allergies Cody Lorenzo, LPN; 0/35/5974 16:38 AM) Lipitor *ANTIHYPERLIPIDEMICS*  Medication History Cody Lorenzo, LPN; 4/53/6468 03:21 AM) Diltiazem HCl ER Coated Beads (120MG  Capsule ER 24HR, Oral) Active. Finasteride (5MG  Tablet, Oral) Active. Flecainide Acetate (150MG  Tablet, Oral) Active. Pravastatin Sodium (40MG  Tablet, Oral) Active. Tamsulosin HCl (0.4MG  Capsule, Oral) Active. Xarelto (20MG  Tablet, Oral) Active. Nasacort AQ (55MCG/ACT Aerosol Soln, Nasal) Active. Turmeric Curcumin (Oral) Active. Medications Reconciled  Social History Cody Lorenzo, LPN; 10/05/8248 03:70 AM) Alcohol use Occasional alcohol use. Caffeine use Coffee. No drug  use Tobacco use Never smoker.  Family History Cody Lorenzo, LPN; 4/88/8916 94:50 AM) Arthritis Mother. Heart Disease Father. Melanoma Sister.     Review of Systems Cody Billings Dockery LPN; 3/88/8280 03:49 AM) General Not Present- Appetite Loss, Chills, Fatigue, Fever, Night Sweats, Weight Gain and Weight Loss. Skin Not Present- Change in Wart/Mole, Dryness, Hives, Jaundice, New Lesions, Non-Healing Wounds, Rash and Ulcer. HEENT Present- Seasonal Allergies. Not Present- Earache, Hearing Loss, Hoarseness, Nose Bleed, Oral Ulcers, Ringing in the Ears, Sinus Pain, Sore Throat, Visual Disturbances, Wears glasses/contact lenses and Yellow Eyes. Respiratory Not Present- Bloody sputum, Chronic Cough, Difficulty Breathing, Snoring and Wheezing. Breast Not Present- Breast Mass, Breast Pain, Nipple Discharge and Skin Changes. Cardiovascular Not Present- Chest Pain, Difficulty Breathing Lying Down, Leg Cramps, Palpitations, Rapid Heart Rate, Shortness of Breath and Swelling of Extremities. Gastrointestinal Present- Hemorrhoids. Not Present- Abdominal Pain, Bloating, Bloody Stool, Change in Bowel Habits, Chronic diarrhea, Constipation, Difficulty Swallowing, Excessive gas, Gets full quickly at meals, Indigestion, Nausea, Rectal Pain and Vomiting. Male Genitourinary Present- Nocturia. Not Present- Blood in Urine, Change in Urinary Stream, Frequency, Impotence, Painful Urination, Urgency and Urine Leakage. Musculoskeletal Present- Joint Pain. Not Present- Back Pain, Joint Stiffness, Muscle Pain, Muscle Weakness and Swelling of Extremities. Neurological Not Present- Decreased Memory, Fainting, Headaches, Numbness, Seizures, Tingling, Tremor, Trouble walking and Weakness. Psychiatric Not Present- Anxiety, Bipolar, Change in Sleep Pattern, Depression, Fearful and Frequent crying. Endocrine Not Present- Cold Intolerance, Excessive Hunger, Hair Changes, Heat Intolerance, Hot flashes and New  Diabetes. Hematology Not Present- Easy Bruising, Excessive bleeding, Gland problems, HIV and Persistent Infections.  Vitals Cody Billings Dockery LPN; 1/79/1505 69:79 AM) 12/22/2014 11:17 AM Weight: 199.2 lb Height: 70in Body Surface Area: 2.11 m Body Mass Index: 28.58 kg/m Temp.: 97.69F(Oral)  Pulse: 61 (Regular)  BP: 130/80 (Sitting, Left Arm, Standard)     Physical Exam Cody Hodge; 4/80/1655  11:57 AM)  General Mental Status-Alert. General Appearance-Consistent with stated age. Hydration-Well hydrated. Voice-Normal.  Head and Neck Head-normocephalic, atraumatic with no lesions or palpable masses. Trachea-midline. Thyroid Gland Characteristics - normal size and consistency.  Eye Eyeball - Bilateral-Extraocular movements intact. Sclera/Conjunctiva - Bilateral-No scleral icterus.  Chest and Lung Exam Chest and lung exam reveals -quiet, even and easy respiratory effort with no use of accessory muscles and on auscultation, normal breath sounds, no adventitious sounds and normal vocal resonance. Inspection Chest Wall - Normal. Back - normal.  Cardiovascular Cardiovascular examination reveals -normal heart sounds, regular rate and rhythm with no murmurs and normal pedal pulses bilaterally.  Abdomen Inspection Inspection of the abdomen reveals - No Hernias. Palpation/Percussion Palpation and Percussion of the abdomen reveal - Soft, Non Tender, No Rebound tenderness, No Rigidity (guarding) and No hepatosplenomegaly. Auscultation Auscultation of the abdomen reveals - Bowel sounds normal.  Rectal Anorectal Exam External - skin tag. Internal - normal internal exam and normal sphincter tone. Proctoscopic exam -Note:see procedure note.   Neurologic Neurologic evaluation reveals -alert and oriented x 3 with no impairment of recent or remote memory. Mental Status-Normal.  Musculoskeletal Global Assessment -Note:no gross  deformities.  Normal Exam - Left-Upper Extremity Strength Normal and Lower Extremity Strength Normal. Normal Exam - Right-Upper Extremity Strength Normal and Lower Extremity Strength Normal.    Assessment & Plan Cody Hodge; 7/37/3668 11:57 AM)  PROLAPSED INTERNAL HEMORRHOIDS, GRADE 3 (455.2  K64.8) Impression: 69 year old male who presents to the office with prolapsed internal hemorrhoids that bleed on occasion. Patient was also around toe but has stopped this in favor of a baby aspirin because of his bleeding. He is taking this due to atrial fibrillation. We discussed ways to manage this including rubber band ligation in the office and hemorrhoidal pexy in the OR. He is elected to proceed with hemorrhoidal pexy. We discussed the risk and benefits of this which are bleeding, pain and recurrence.

## 2015-01-18 DIAGNOSIS — E780 Pure hypercholesterolemia: Secondary | ICD-10-CM | POA: Diagnosis not present

## 2015-01-18 DIAGNOSIS — I2510 Atherosclerotic heart disease of native coronary artery without angina pectoris: Secondary | ICD-10-CM | POA: Diagnosis not present

## 2015-01-18 DIAGNOSIS — I480 Paroxysmal atrial fibrillation: Secondary | ICD-10-CM | POA: Diagnosis not present

## 2015-02-07 DIAGNOSIS — Z9889 Other specified postprocedural states: Secondary | ICD-10-CM | POA: Diagnosis not present

## 2015-02-07 DIAGNOSIS — H04123 Dry eye syndrome of bilateral lacrimal glands: Secondary | ICD-10-CM | POA: Diagnosis not present

## 2015-02-07 DIAGNOSIS — H2513 Age-related nuclear cataract, bilateral: Secondary | ICD-10-CM | POA: Diagnosis not present

## 2015-02-16 DIAGNOSIS — I480 Paroxysmal atrial fibrillation: Secondary | ICD-10-CM | POA: Diagnosis not present

## 2015-03-02 ENCOUNTER — Ambulatory Visit: Payer: Self-pay | Admitting: Orthopedic Surgery

## 2015-03-02 NOTE — Progress Notes (Signed)
Preoperative surgical orders have been place into the [facility] hospital system for Kamarius Buckbee on 03/02/2015, 6:26 PM  by Mickel Crow for surgery on 03/29/2015.  Preop Bilateral Total Knee orders including Epidural per Anesthesia, IV Tylenol, and IV Decadron as long as there are no contraindications to the above medications. Arlee Muslim, PA-C

## 2015-03-14 DIAGNOSIS — I2510 Atherosclerotic heart disease of native coronary artery without angina pectoris: Secondary | ICD-10-CM | POA: Diagnosis not present

## 2015-03-14 DIAGNOSIS — I480 Paroxysmal atrial fibrillation: Secondary | ICD-10-CM | POA: Diagnosis not present

## 2015-03-14 DIAGNOSIS — E780 Pure hypercholesterolemia: Secondary | ICD-10-CM | POA: Diagnosis not present

## 2015-03-14 DIAGNOSIS — Z01810 Encounter for preprocedural cardiovascular examination: Secondary | ICD-10-CM | POA: Diagnosis not present

## 2015-03-15 ENCOUNTER — Encounter: Payer: Self-pay | Admitting: Cardiology

## 2015-03-22 NOTE — Patient Instructions (Addendum)
[place]  03/22/2015     Your procedure is scheduled on:   03-29-2015 Wednesday  Enter through [place] and follow signs to [facility]. Arrive at     0800   AM.  (Limit 1 person with you).  Call this number if you have problems the morning of surgery: [phone]  Or Presurgical Testing [phone].   For Living Will and/or Health Care Power Attorney Forms: please provide copy for your medical record,may bring AM of surgery(Forms should be already notarized -we do not provide this service).(03-23-15 Yes/ will provide copy of information AM of surgery).      Do not eat food/ or drink: After Midnight.      Take these medicines the morning of surgery with A SIP OF WATER:  Diltiazem. Flecainide. Finasteride.Tamsulosin. Use/bring Nasacort Spray.  Do not wear jewelry, make-up or nail polish.  Do not wear deodorant, lotions, powders, or perfumes.   Do not shave legs and under arms- 48 hours(2 days) prior to first CHG shower.(Shaving face and neck okay.)  Do not bring valuables to the hospital.(Hospital is not responsible for lost valuables).  Contacts, dentures or removable bridgework, body piercing, hair pins may not be worn into surgery.  Leave suitcase in the car. After surgery it may be brought to your room.  For patients admitted to the hospital, checkout time is 11:00 AM the day of discharge.(Restricted visitors-Any Persons displaying flu-like symptoms or illness).    Patients discharged the day of surgery will not be allowed to drive home. Must have responsible person with you x 24 hours once discharged.  Name and phone number of your driver: Gerald Stabs ,spouse [phone] cell  Blood Bank(Blue wrist Bands) can not be removed once applied, unless being discharged home.       [facility] - Preparing for Surgery Before surgery, you can play an important role.  Because skin is not sterile, your skin needs to be as free of germs as possible.  You can  reduce the number of germs on your skin by washing with CHG (chlorahexidine gluconate) soap before surgery.  CHG is an antiseptic cleaner which kills germs and bonds with the skin to continue killing germs even after washing. Please DO NOT use if you have an allergy to CHG or antibacterial soaps.  If your skin becomes reddened/irritated stop using the CHG and inform your nurse when you arrive at Short Stay. Do not shave (including legs and underarms) for at least 48 hours prior to the first CHG shower.  You may shave your face/neck. Please follow these instructions carefully:  1.  Shower with CHG Soap the night before surgery and the  morning of Surgery.  2.  If you choose to wash your hair, wash your hair first as usual with your  normal  shampoo.  3.  After you shampoo, rinse your hair and body thoroughly to remove the  shampoo.                           4.  Use CHG as you would any other liquid soap.  You can apply chg directly  to the skin and wash                       Gently with a scrungie or clean washcloth.  5.  Apply the CHG Soap to your body ONLY FROM THE NECK DOWN.   Do  not use on face/ open                           Wound or open sores. Avoid contact with eyes, ears mouth and genitals (private parts).                       Wash face,  Genitals (private parts) with your normal soap.             6.  Wash thoroughly, paying special attention to the area where your surgery  will be performed.  7.  Thoroughly rinse your body with warm water from the neck down.  8.  DO NOT shower/wash with your normal soap after using and rinsing off  the CHG Soap.                9.  Pat yourself dry with a clean towel.            10.  Wear clean pajamas.            11.  Place clean sheets on your bed the night of your first shower and do not  sleep with pets. Day of Surgery : Do not apply any lotions/deodorants the morning of surgery.  Please wear clean clothes to the hospital/surgery center.  FAILURE TO  FOLLOW THESE INSTRUCTIONS MAY RESULT IN THE CANCELLATION OF YOUR SURGERY PATIENT SIGNATURE_________________________________  NURSE SIGNATURE__________________________________  ________________________________________________________________________   Cody Hodge  An incentive spirometer is a tool that can help keep your lungs clear and active. This tool measures how well you are filling your lungs with each breath. Taking long deep breaths may help reverse or decrease the chance of developing breathing (pulmonary) problems (especially infection) following:  A long period of time when you are unable to move or be active. BEFORE THE PROCEDURE   If the spirometer includes an indicator to show your best effort, your nurse or respiratory therapist will set it to a desired goal.  If possible, sit up straight or lean slightly forward. Try not to slouch.  Hold the incentive spirometer in an upright position. INSTRUCTIONS FOR USE  1. Sit on the edge of your bed if possible, or sit up as far as you can in bed or on a chair. 2. Hold the incentive spirometer in an upright position. 3. Breathe out normally. 4. Place the mouthpiece in your mouth and seal your lips tightly around it. 5. Breathe in slowly and as deeply as possible, raising the piston or the ball toward the top of the column. 6. Hold your breath for 3-5 seconds or for as long as possible. Allow the piston or ball to fall to the bottom of the column. 7. Remove the mouthpiece from your mouth and breathe out normally. 8. Rest for a few seconds and repeat Steps 1 through 7 at least 10 times every 1-2 hours when you are awake. Take your time and take a few normal breaths between deep breaths. 9. The spirometer may include an indicator to show your best effort. Use the indicator as a goal to work toward during each repetition. 10. After each set of 10 deep breaths, practice coughing to be sure your lungs are clear. If you have an  incision (the cut made at the time of surgery), support your incision when coughing by placing a pillow or rolled up towels firmly against it. Once you are able to get out of  bed, walk around indoors and cough well. You may stop using the incentive spirometer when instructed by your caregiver.  RISKS AND COMPLICATIONS  Take your time so you do not get dizzy or light-headed.  If you are in pain, you may need to take or ask for pain medication before doing incentive spirometry. It is harder to take a deep breath if you are having pain. AFTER USE  Rest and breathe slowly and easily.  It can be helpful to keep track of a log of your progress. Your caregiver can provide you with a simple table to help with this. If you are using the spirometer at home, follow these instructions: [place] IF:   You are having difficultly using the spirometer.  You have trouble using the spirometer as often as instructed.  Your pain medication is not giving enough relief while using the spirometer.  You develop fever of 100.5 F (38.1 C) or higher. SEEK IMMEDIATE MEDICAL CARE IF:   You cough up bloody sputum that had not been present before.  You develop fever of 102 F (38.9 C) or greater.  You develop worsening pain at or near the incision site. MAKE SURE YOU:   Understand these instructions.  Will watch your condition.  Will get help right away if you are not doing well or get worse. Document Released: 12/09/2006 Document Revised: 10/21/2011 Document Reviewed: 02/09/2007 ExitCare Patient Information 2014 ExitCare, [place].   ________________________________________________________________________  WHAT IS A BLOOD TRANSFUSION? Blood Transfusion Information  A transfusion is the replacement of blood or some of its parts. Blood is made up of multiple cells which provide different functions.  Red blood cells carry oxygen and are used for blood loss replacement.  White blood cells  fight against infection.  Platelets control bleeding.  Plasma helps clot blood.  Other blood products are available for specialized needs, such as hemophilia or other clotting disorders. BEFORE THE TRANSFUSION  Who gives blood for transfusions?   Healthy volunteers who are fully evaluated to make sure their blood is safe. This is blood bank blood. Transfusion therapy is the safest it has ever been in the practice of medicine. Before blood is taken from a donor, a complete history is taken to make sure that person has no history of diseases nor engages in risky social behavior (examples are intravenous drug use or sexual activity with multiple partners). The donor's travel history is screened to minimize risk of transmitting infections, such as malaria. The donated blood is tested for signs of infectious diseases, such as HIV and hepatitis. The blood is then tested to be sure it is compatible with you in order to minimize the chance of a transfusion reaction. If you or a relative donates blood, this is often done in anticipation of surgery and is not appropriate for emergency situations. It takes many days to process the donated blood. RISKS AND COMPLICATIONS Although transfusion therapy is very safe and saves many lives, the main dangers of transfusion include:  11. Getting an infectious disease. 12. Developing a transfusion reaction. This is an allergic reaction to something in the blood you were given. Every precaution is taken to prevent this. The decision to have a blood transfusion has been considered carefully by your caregiver before blood is given. Blood is not given unless the benefits outweigh the risks. AFTER THE TRANSFUSION  Right after receiving a blood transfusion, you will usually feel much better and more energetic. This is especially true if your red blood cells  have gotten low (anemic). The transfusion raises the level of the red blood cells which carry oxygen, and this usually  causes an energy increase.  The nurse administering the transfusion will monitor you carefully for complications. HOME CARE INSTRUCTIONS  No special instructions are needed after a transfusion. You may find your energy is better. Speak with your caregiver about any limitations on activity for underlying diseases you may have. SEEK MEDICAL CARE IF:   Your condition is not improving after your transfusion.  You develop redness or irritation at the intravenous (IV) site. SEEK IMMEDIATE MEDICAL CARE IF:  Any of the following symptoms occur over the next 12 hours:  Shaking chills.  You have a temperature by mouth above 102 F (38.9 C), not controlled by medicine.  Chest, back, or muscle pain.  People around you feel you are not acting correctly or are confused.  Shortness of breath or difficulty breathing.  Dizziness and fainting.  You get a rash or develop hives.  You have a decrease in urine output.  Your urine turns a dark color or changes to pink, red, or brown. Any of the following symptoms occur over the next 10 days:  You have a temperature by mouth above 102 F (38.9 C), not controlled by medicine.  Shortness of breath.  Weakness after normal activity.  The white part of the eye turns yellow (jaundice).  You have a decrease in the amount of urine or are urinating less often.  Your urine turns a dark color or changes to pink, red, or brown. Document Released: 07/26/2000 Document Revised: 10/21/2011 Document Reviewed: 03/14/2008 [facility] Patient Information 2014 [place], [place].  _______________________________________________________________________

## 2015-03-23 ENCOUNTER — Encounter (HOSPITAL_COMMUNITY)
Admission: RE | Admit: 2015-03-23 | Discharge: 2015-03-23 | Disposition: A | Discharge status: Home / Self Care | Payer: Medicare Other | Source: Ambulatory Visit | Attending: Orthopedic Surgery | Admitting: Orthopedic Surgery

## 2015-03-23 ENCOUNTER — Encounter (HOSPITAL_COMMUNITY): Payer: Self-pay

## 2015-03-23 DIAGNOSIS — M170 Bilateral primary osteoarthritis of knee: Secondary | ICD-10-CM | POA: Insufficient documentation

## 2015-03-23 DIAGNOSIS — Z7901 Long term (current) use of anticoagulants: Secondary | ICD-10-CM | POA: Insufficient documentation

## 2015-03-23 DIAGNOSIS — Z01818 Encounter for other preprocedural examination: Secondary | ICD-10-CM | POA: Insufficient documentation

## 2015-03-23 HISTORY — DX: Unspecified hemorrhoids: K64.9

## 2015-03-23 LAB — CBC
HEMATOCRIT: 41.1 % (ref 39.0–52.0)
Hemoglobin: 13.5 g/dL (ref 13.0–17.0)
MCH: 27.6 pg (ref 26.0–34.0)
MCHC: 32.8 g/dL (ref 30.0–36.0)
MCV: 84 fL (ref 78.0–100.0)
Platelets: 259 10*3/uL (ref 150–400)
RBC: 4.89 MIL/uL (ref 4.22–5.81)
RDW: 13.7 % (ref 11.5–15.5)
WBC: 8.6 10*3/uL (ref 4.0–10.5)

## 2015-03-23 LAB — URINALYSIS, ROUTINE W REFLEX MICROSCOPIC
Bilirubin Urine: NEGATIVE
GLUCOSE, UA: NEGATIVE mg/dL
HGB URINE DIPSTICK: NEGATIVE
Ketones, ur: NEGATIVE mg/dL
Leukocytes, UA: NEGATIVE
Nitrite: NEGATIVE
Protein, ur: NEGATIVE mg/dL
Specific Gravity, Urine: 1.025 (ref 1.005–1.030)
Urobilinogen, UA: 0.2 mg/dL (ref 0.0–1.0)
pH: 7.5 (ref 5.0–8.0)

## 2015-03-23 LAB — SURGICAL PCR SCREEN
MRSA, PCR: NEGATIVE
Staphylococcus aureus: POSITIVE — AB

## 2015-03-23 LAB — PROTIME-INR
INR: 1.14 (ref 0.00–1.49)
PROTHROMBIN TIME: 14.8 s (ref 11.6–15.2)

## 2015-03-23 LAB — COMPREHENSIVE METABOLIC PANEL
ALT: 41 U/L (ref 17–63)
ANION GAP: 5 (ref 5–15)
AST: 31 U/L (ref 15–41)
Albumin: 4.1 g/dL (ref 3.5–5.0)
Alkaline Phosphatase: 101 U/L (ref 38–126)
BUN: 18 mg/dL (ref 6–20)
CO2: 32 mmol/L (ref 22–32)
Calcium: 9.4 mg/dL (ref 8.9–10.3)
Chloride: 102 mmol/L (ref 101–111)
Creatinine, Ser: 1.13 mg/dL (ref 0.61–1.24)
GFR calc Af Amer: >60 mL/min (ref >60)
Glucose, Bld: 87 mg/dL (ref 65–99)
Potassium: 4.6 mmol/L (ref 3.5–5.1)
SODIUM: 139 mmol/L (ref 135–145)
Total Bilirubin: 0.5 mg/dL (ref 0.3–1.2)
Total Protein: 6.8 g/dL (ref 6.5–8.1)

## 2015-03-23 LAB — ABO/RH: ABO/RH(D): A NEG

## 2015-03-23 LAB — APTT: APTT: 28 s (ref 24–37)

## 2015-03-23 NOTE — Pre-Procedure Instructions (Signed)
EKG / Stress 2'16 Epic. Echo 02-18-15 with chart. EKG? LOV notes( 8'16 Dr. Einar Gip) with chart.  Cardiac Cath 2'16 Epic. Clearance note Dr. Alroy Dust with chart(slearance note with( Dr. Einar Gip LOV 03-14-15) with chart.

## 2015-03-24 NOTE — Progress Notes (Signed)
03-24-15 0800 Positive Staph aureus by PCR, pt to use Mupirocin Ointment as directed.

## 2015-03-26 ENCOUNTER — Ambulatory Visit: Payer: Self-pay | Admitting: Orthopedic Surgery

## 2015-03-26 NOTE — H&P (Signed)
Cody Hodge DOB: [birth year] Married / Language: English / Race: White Male Date of Admission:  03/29/2015 CC: Bilateral Knee Pain History of Present Illness The patient is a 69 year old male who comes in for a preoperative History and Physical. The patient is scheduled for a bilateral total knee arthroplasty to be performed by Dr. Dione Plover. Aluisio, MD at [facility] on 03/29/2015. The patient is a 69 year old male who presents with knee complaints. The patient was seen for a second opinion. The patient reports left knee and right knee symptoms including: pain, swelling, instability, stiffness and soreness which began year(s) ago in association with an established activity (very active playing tennis).The patient feels that the symptoms are worsening. The patient has the current diagnosis of knee osteoarthritis. Prior to being seen today the patient was previously evaluated by a colleague. Previous work-up for this problem has included knee x-rays and arthroscopy. Past treatment for this problem has included intra-articular injection of corticosteroids and nonsteroidal anti-inflammatory drugs (before activity). He states that both knees hurt equally. He is an avid Firefighter and the knees are affecting his ability to play tennis. He still tries to do it once or twice a week, but are really painful afterwards. He has increased pain and swelling after playing. Other activities that he desires have been more difficult also. He is at stage now where he is interested in contemplating surgical treatment. He has previously been treated by Dr. Amada Jupiter with arthroscopies and injections in the past. He is interested in doing both knees at the same time. He is not having any problems with his hip or back. They have been treated conservatively in the past for the above stated problem and despite conservative measures, they continue to have progressive pain and severe functional limitations and  dysfunction. They have failed non-operative management including home exercise, medications, and injections. It is felt that they would benefit from undergoing total joint replacement. Risks and benefits of the procedure have been discussed with the patient and they elect to proceed with surgery. There are no active contraindications to surgery such as ongoing infection or rapidly progressive neurological disease.   Problem List/Past Medical Primary osteoarthritis of right knee (M17.11) Coronary Artery Disease/Heart Disease Mild Hemorrhoids Enlarged prostate Mild Atrial Fibrillation Paroxysmal Hypercholesterolemia  Allergies Lipitor *ANTIHYPERLIPIDEMICS* joint pain  Family History Father MI, Sepsis Mother Dementia  Social History Number of flights of stairs before winded 4-5 Pain Contract no Living situation live with spouse Marital status married Tobacco use never smoker Children 3 Current work status working full time Alcohol use current drinker; drinks beer; only occasionally per week Exercise Exercises weekly; does individual sport Illicit drug use no Drug/Alcohol Rehab (Currently) no Drug/Alcohol Rehab (Previously) no Advance Directives Living Will, Healthcare POA  Medication History Flecainide Acetate (150MG  Tablet, Oral) Active. Finasteride (5MG  Tablet, Oral) Active. Pravastatin Sodium (40MG  Tablet, Oral) Active. Diltiazem HCl ER Coated Beads (120MG  Capsule ER 24HR, Oral) Active. Tamsulosin HCl (0.4MG  Capsule, Oral) Active. Vitamin D (Oral) Specific dose unknown - Active. Turmeric (Oral two times daily) Specific dose unknown - Active. Resveratrol (Oral) Specific dose unknown - Active. Aspirin EC (325MG  Tablet DR, Oral) Active.  Past Surgical History Arthroscopy of Knee bilateral   Review of Systems  General Not Present- Chills, Fatigue, Fever, Memory Loss, Night Sweats, Weight Gain and Weight Loss. Skin Not Present- Eczema,  Hives, Itching, Lesions and Rash. HEENT Not Present- Dentures, Double Vision, Headache, Hearing Loss, Tinnitus and Visual Loss. Respiratory  Not Present- Allergies, Chronic Cough, Coughing up blood, Shortness of breath at rest and Shortness of breath with exertion. Cardiovascular Not Present- Chest Pain, Difficulty Breathing Lying Down, Murmur, Palpitations, Racing/skipping heartbeats and Swelling. Gastrointestinal Not Present- Abdominal Pain, Bloody Stool, Constipation, Diarrhea, Difficulty Swallowing, Heartburn, Jaundice, Loss of appetitie, Nausea and Vomiting. Male Genitourinary Present- Urinary frequency and Urinating at Night. Not Present- Blood in Urine, Discharge, Flank Pain, Incontinence, Painful Urination, Urgency, Urinary Retention and Weak urinary stream. Musculoskeletal Present- Joint Pain. Not Present- Back Pain, Joint Swelling, Morning Stiffness, Muscle Pain, Muscle Weakness and Spasms. Neurological Not Present- Blackout spells, Difficulty with balance, Dizziness, Paralysis, Tremor and Weakness. Psychiatric Not Present- Insomnia.  Vitals  Weight: 193 lb Height: 70.5in Weight was reported by patient. Height was reported by patient. Body Surface Area: 2.07 m Body Mass Index: 27.3 kg/m  BP: 122/68 (Sitting, Right Arm, Standard)  Physical Exam  General Mental Status -Alert, cooperative and good historian. General Appearance-pleasant, Not in acute distress. Orientation-Oriented X3. Build & Nutrition-Well nourished and Well developed.  Head and Neck Head-normocephalic, atraumatic . Neck Global Assessment - supple, no bruit auscultated on the right, no bruit auscultated on the left.  Eye Vision-Wears contact lenses. Pupil - Bilateral-Regular and Round. Motion - Bilateral-EOMI.  Chest and Lung Exam Auscultation Breath sounds - clear at anterior chest wall and clear at posterior chest wall. Adventitious sounds - No Adventitious  sounds.  Cardiovascular Auscultation Rhythm - Regular rate and rhythm. Heart Sounds - S1 WNL and S2 WNL. Murmurs & Other Heart Sounds - Auscultation of the heart reveals - No Murmurs.  Abdomen Palpation/Percussion Tenderness - Abdomen is non-tender to palpation. Rigidity (guarding) - Abdomen is soft. Auscultation Auscultation of the abdomen reveals - Bowel sounds normal.  Male Genitourinary Note: Not done, not pertinent to present illness  Musculoskeletal Note: A well-developed male, alert and oriented, in no apparent distress. Both hips show normal range of motion, but no discomfort. Both knees show no effusion. Range is about 5 to 125 on each knee. There is moderate crepitus on range of motion with tenderness medial greater than lateral with no instability. Pulse, sensation and motor are intact.  RADIOGRAPHS AP and lateral both knees show bone on bone arthritis in medial and patellofemoral compartments of both knees.  Assessment & Plan  Primary osteoarthritis of left knee (M17.12) Primary osteoarthritis of right knee (M17.11) Note:Surgical Plans: Bilateral Total Knee Replacements  Disposition: Rehab  PCP: Dr. Alroy Dust Cards: Dr. Einar Gip - Patient has been seen preoperatively and felt to be stable for surgery.  Topical TXA  Anesthesia Issues: None  Comments: Patietn was instructed to stop the Xarelto 48 hours prior to his surgery.  Signed electronically by Joelene Millin, III PA-C

## 2015-03-29 ENCOUNTER — Encounter (HOSPITAL_COMMUNITY)
Admission: RE | Disposition: A | Discharge status: Rehabilitation Facility | Payer: Self-pay | Source: Ambulatory Visit | Attending: Orthopedic Surgery

## 2015-03-29 ENCOUNTER — Inpatient Hospital Stay (HOSPITAL_COMMUNITY)
Admission: RE | Admit: 2015-03-29 | Discharge: 2015-04-01 | DRG: 462 | Disposition: A | Discharge status: Rehabilitation Facility | Payer: Medicare Other | Source: Ambulatory Visit | Attending: Orthopedic Surgery | Admitting: Orthopedic Surgery

## 2015-03-29 ENCOUNTER — Inpatient Hospital Stay (HOSPITAL_COMMUNITY): Payer: Medicare Other | Admitting: Anesthesiology

## 2015-03-29 ENCOUNTER — Encounter (HOSPITAL_COMMUNITY): Payer: Self-pay | Admitting: *Deleted

## 2015-03-29 DIAGNOSIS — D62 Acute posthemorrhagic anemia: Secondary | ICD-10-CM | POA: Diagnosis not present

## 2015-03-29 DIAGNOSIS — I10 Essential (primary) hypertension: Secondary | ICD-10-CM | POA: Diagnosis present

## 2015-03-29 DIAGNOSIS — N529 Male erectile dysfunction, unspecified: Secondary | ICD-10-CM | POA: Diagnosis present

## 2015-03-29 DIAGNOSIS — G4733 Obstructive sleep apnea (adult) (pediatric): Secondary | ICD-10-CM | POA: Diagnosis present

## 2015-03-29 DIAGNOSIS — M1712 Unilateral primary osteoarthritis, left knee: Secondary | ICD-10-CM | POA: Diagnosis not present

## 2015-03-29 DIAGNOSIS — Z7982 Long term (current) use of aspirin: Secondary | ICD-10-CM

## 2015-03-29 DIAGNOSIS — Z96653 Presence of artificial knee joint, bilateral: Secondary | ICD-10-CM | POA: Diagnosis not present

## 2015-03-29 DIAGNOSIS — M170 Bilateral primary osteoarthritis of knee: Secondary | ICD-10-CM | POA: Diagnosis not present

## 2015-03-29 DIAGNOSIS — K5901 Slow transit constipation: Secondary | ICD-10-CM | POA: Diagnosis not present

## 2015-03-29 DIAGNOSIS — D72829 Elevated white blood cell count, unspecified: Secondary | ICD-10-CM | POA: Diagnosis present

## 2015-03-29 DIAGNOSIS — I2510 Atherosclerotic heart disease of native coronary artery without angina pectoris: Secondary | ICD-10-CM | POA: Diagnosis present

## 2015-03-29 DIAGNOSIS — Z888 Allergy status to other drugs, medicaments and biological substances status: Secondary | ICD-10-CM

## 2015-03-29 DIAGNOSIS — M129 Arthropathy, unspecified: Secondary | ICD-10-CM | POA: Diagnosis present

## 2015-03-29 DIAGNOSIS — M1710 Unilateral primary osteoarthritis, unspecified knee: Secondary | ICD-10-CM | POA: Diagnosis present

## 2015-03-29 DIAGNOSIS — N400 Enlarged prostate without lower urinary tract symptoms: Secondary | ICD-10-CM | POA: Diagnosis present

## 2015-03-29 DIAGNOSIS — Z79899 Other long term (current) drug therapy: Secondary | ICD-10-CM | POA: Diagnosis not present

## 2015-03-29 DIAGNOSIS — E780 Pure hypercholesterolemia: Secondary | ICD-10-CM | POA: Diagnosis present

## 2015-03-29 DIAGNOSIS — I480 Paroxysmal atrial fibrillation: Secondary | ICD-10-CM | POA: Diagnosis present

## 2015-03-29 DIAGNOSIS — Z85820 Personal history of malignant melanoma of skin: Secondary | ICD-10-CM

## 2015-03-29 DIAGNOSIS — G8918 Other acute postprocedural pain: Secondary | ICD-10-CM | POA: Diagnosis not present

## 2015-03-29 DIAGNOSIS — M179 Osteoarthritis of knee, unspecified: Secondary | ICD-10-CM | POA: Diagnosis present

## 2015-03-29 DIAGNOSIS — M1711 Unilateral primary osteoarthritis, right knee: Secondary | ICD-10-CM | POA: Diagnosis not present

## 2015-03-29 DIAGNOSIS — E782 Mixed hyperlipidemia: Secondary | ICD-10-CM | POA: Diagnosis present

## 2015-03-29 HISTORY — PX: TOTAL KNEE ARTHROPLASTY: SHX125

## 2015-03-29 LAB — TYPE AND SCREEN
ABO/RH(D): A NEG
ANTIBODY SCREEN: NEGATIVE

## 2015-03-29 SURGERY — ARTHROPLASTY, KNEE, BILATERAL, TOTAL
Anesthesia: Spinal | Site: Knee | Laterality: Bilateral

## 2015-03-29 MED ORDER — SODIUM CHLORIDE 0.9 % IJ SOLN: 3.0000 mL | INTRAMUSCULAR | Status: DC | PRN

## 2015-03-29 MED ORDER — MIDAZOLAM HCL 5 MG/5ML IJ SOLN
INTRAMUSCULAR | Status: DC | PRN
  Administered 2015-03-29 (×2): 1 mg via INTRAVENOUS

## 2015-03-29 MED ORDER — PHENOL 1.4 % MT LIQD: 1.0000 | OROMUCOSAL | Status: DC | PRN

## 2015-03-29 MED ORDER — NALBUPHINE HCL 20 MG/ML IJ SOLN
5.0000 mg | INTRAMUSCULAR | Status: DC | PRN
  Filled 2015-03-29: qty 0.5

## 2015-03-29 MED ORDER — PHENYLEPHRINE HCL 10 MG/ML IJ SOLN
INTRAMUSCULAR | Status: AC
  Filled 2015-03-29: qty 1

## 2015-03-29 MED ORDER — 0.9 % SODIUM CHLORIDE (POUR BTL) OPTIME
TOPICAL | Status: DC | PRN
  Administered 2015-03-29: 1000 mL

## 2015-03-29 MED ORDER — EPHEDRINE SULFATE 50 MG/ML IJ SOLN
INTRAMUSCULAR | Status: DC | PRN
  Administered 2015-03-29 (×3): 5 mg via INTRAVENOUS

## 2015-03-29 MED ORDER — NALBUPHINE HCL 10 MG/ML IJ SOLN
5.0000 mg | Freq: Once | INTRAMUSCULAR | Status: DC | PRN
  Filled 2015-03-29: qty 0.5

## 2015-03-29 MED ORDER — PROPOFOL 10 MG/ML IV BOLUS
INTRAVENOUS | Status: AC
  Filled 2015-03-29: qty 20

## 2015-03-29 MED ORDER — PROPOFOL INFUSION 10 MG/ML OPTIME
INTRAVENOUS | Status: DC | PRN
  Administered 2015-03-29: 75 ug/kg/min via INTRAVENOUS

## 2015-03-29 MED ORDER — DIPHENHYDRAMINE HCL 12.5 MG/5ML PO ELIX: 12.5000 mg | ORAL_SOLUTION | ORAL | Status: DC | PRN

## 2015-03-29 MED ORDER — METOCLOPRAMIDE HCL 5 MG/ML IJ SOLN: 5.0000 mg | Freq: Three times a day (TID) | INTRAMUSCULAR | Status: DC | PRN

## 2015-03-29 MED ORDER — FENTANYL CITRATE (PF) 100 MCG/2ML IJ SOLN
INTRAMUSCULAR | Status: AC
  Filled 2015-03-29: qty 4

## 2015-03-29 MED ORDER — BISACODYL 10 MG RE SUPP: 10.0000 mg | Freq: Every day | RECTAL | Status: DC | PRN

## 2015-03-29 MED ORDER — ONDANSETRON HCL 4 MG/2ML IJ SOLN
INTRAMUSCULAR | Status: AC
  Filled 2015-03-29: qty 2

## 2015-03-29 MED ORDER — KETOROLAC TROMETHAMINE 30 MG/ML IJ SOLN: 30.0000 mg | Freq: Four times a day (QID) | INTRAMUSCULAR | Status: DC | PRN

## 2015-03-29 MED ORDER — BUPIVACAINE-EPINEPHRINE (PF) 0.25% -1:200000 IJ SOLN
INTRAMUSCULAR | Status: DC | PRN
  Administered 2015-03-29: 5 mL

## 2015-03-29 MED ORDER — BUPIVACAINE-EPINEPHRINE (PF) 0.25% -1:200000 IJ SOLN
INTRAMUSCULAR | Status: AC
  Filled 2015-03-29: qty 30

## 2015-03-29 MED ORDER — FLEET ENEMA 7-19 GM/118ML RE ENEM: 1.0000 | ENEMA | Freq: Once | RECTAL | Status: DC | PRN

## 2015-03-29 MED ORDER — ACETAMINOPHEN 650 MG RE SUPP: 650.0000 mg | Freq: Four times a day (QID) | RECTAL | Status: DC | PRN

## 2015-03-29 MED ORDER — LACTATED RINGERS IV SOLN
INTRAVENOUS | Status: DC | PRN
  Administered 2015-03-29 (×2): via INTRAVENOUS

## 2015-03-29 MED ORDER — ACETAMINOPHEN 10 MG/ML IV SOLN: 1000.0000 mg | Freq: Once | INTRAVENOUS | Status: DC

## 2015-03-29 MED ORDER — NALOXONE HCL 0.4 MG/ML IJ SOLN: 0.4000 mg | INTRAMUSCULAR | Status: DC | PRN

## 2015-03-29 MED ORDER — DOCUSATE SODIUM 100 MG PO CAPS
100.0000 mg | ORAL_CAPSULE | Freq: Two times a day (BID) | ORAL | Status: DC
  Administered 2015-03-29 – 2015-04-01 (×6): 100 mg via ORAL

## 2015-03-29 MED ORDER — MEPERIDINE HCL 50 MG/ML IJ SOLN: 6.2500 mg | INTRAMUSCULAR | Status: DC | PRN

## 2015-03-29 MED ORDER — TRIAMCINOLONE ACETONIDE 55 MCG/ACT NA AERO
1.0000 | INHALATION_SPRAY | Freq: Every day | NASAL | Status: DC
  Administered 2015-03-29 – 2015-03-30 (×2): 1 via NASAL
  Filled 2015-03-29: qty 10.8

## 2015-03-29 MED ORDER — WARFARIN SODIUM 2.5 MG PO TABS
2.5000 mg | ORAL_TABLET | Freq: Once | ORAL | Status: AC
  Administered 2015-03-30: 2.5 mg via ORAL
  Filled 2015-03-29: qty 1

## 2015-03-29 MED ORDER — ACETAMINOPHEN 500 MG PO TABS
1000.0000 mg | ORAL_TABLET | Freq: Four times a day (QID) | ORAL | Status: AC
  Administered 2015-03-29 – 2015-03-30 (×4): 1000 mg via ORAL
  Filled 2015-03-29 (×4): qty 2

## 2015-03-29 MED ORDER — TRANEXAMIC ACID 1000 MG/10ML IV SOLN
1000.0000 mg | INTRAVENOUS | Status: DC | PRN
  Administered 2015-03-29 (×2): 1000 mg via TOPICAL

## 2015-03-29 MED ORDER — SODIUM CHLORIDE 0.9 % IV SOLN
2000.0000 mg | Freq: Once | INTRAVENOUS | Status: DC
  Filled 2015-03-29: qty 20

## 2015-03-29 MED ORDER — SCOPOLAMINE 1 MG/3DAYS TD PT72
1.0000 | MEDICATED_PATCH | Freq: Once | TRANSDERMAL | Status: DC
  Filled 2015-03-29: qty 1

## 2015-03-29 MED ORDER — CEFAZOLIN SODIUM-DEXTROSE 2-3 GM-% IV SOLR
2.0000 g | Freq: Four times a day (QID) | INTRAVENOUS | Status: AC
  Administered 2015-03-29 (×2): 2 g via INTRAVENOUS
  Filled 2015-03-29 (×2): qty 50

## 2015-03-29 MED ORDER — NALOXONE HCL 1 MG/ML IJ SOLN
1.0000 ug/kg/h | INTRAMUSCULAR | Status: DC | PRN
  Filled 2015-03-29: qty 2

## 2015-03-29 MED ORDER — DEXAMETHASONE SODIUM PHOSPHATE 10 MG/ML IJ SOLN
10.0000 mg | Freq: Once | INTRAMUSCULAR | Status: AC
  Administered 2015-03-30: 10 mg via INTRAVENOUS
  Filled 2015-03-29: qty 1

## 2015-03-29 MED ORDER — SODIUM CHLORIDE 0.9 % IR SOLN
Status: DC | PRN
  Administered 2015-03-29: 3000 mL

## 2015-03-29 MED ORDER — CHLORHEXIDINE GLUCONATE 4 % EX LIQD: 60.0000 mL | Freq: Once | CUTANEOUS | Status: DC

## 2015-03-29 MED ORDER — POLYETHYLENE GLYCOL 3350 17 G PO PACK
17.0000 g | PACK | Freq: Every day | ORAL | Status: DC | PRN
  Administered 2015-03-31: 17 g via ORAL
  Filled 2015-03-29: qty 1

## 2015-03-29 MED ORDER — METOCLOPRAMIDE HCL 10 MG PO TABS: 5.0000 mg | ORAL_TABLET | Freq: Three times a day (TID) | ORAL | Status: DC | PRN

## 2015-03-29 MED ORDER — MIDAZOLAM HCL 2 MG/2ML IJ SOLN
INTRAMUSCULAR | Status: AC
  Filled 2015-03-29: qty 4

## 2015-03-29 MED ORDER — FINASTERIDE 5 MG PO TABS
5.0000 mg | ORAL_TABLET | Freq: Every day | ORAL | Status: DC
  Administered 2015-03-30 – 2015-04-01 (×3): 5 mg via ORAL
  Filled 2015-03-29 (×3): qty 1

## 2015-03-29 MED ORDER — DEXTROSE 5 % IV SOLN
500.0000 mg | Freq: Four times a day (QID) | INTRAVENOUS | Status: DC | PRN
  Administered 2015-03-30: 500 mg via INTRAVENOUS
  Filled 2015-03-29 (×3): qty 5

## 2015-03-29 MED ORDER — SODIUM CHLORIDE 0.9 % IV SOLN: INTRAVENOUS | Status: DC

## 2015-03-29 MED ORDER — DILTIAZEM HCL ER COATED BEADS 120 MG PO CP24
120.0000 mg | ORAL_CAPSULE | Freq: Every day | ORAL | Status: DC
  Administered 2015-03-31 – 2015-04-01 (×2): 120 mg via ORAL
  Filled 2015-03-29 (×3): qty 1

## 2015-03-29 MED ORDER — ONDANSETRON HCL 4 MG/2ML IJ SOLN: 4.0000 mg | Freq: Four times a day (QID) | INTRAMUSCULAR | Status: DC | PRN

## 2015-03-29 MED ORDER — CEFAZOLIN SODIUM-DEXTROSE 2-3 GM-% IV SOLR
INTRAVENOUS | Status: AC
  Filled 2015-03-29: qty 50

## 2015-03-29 MED ORDER — OXYCODONE HCL 5 MG PO TABS
5.0000 mg | ORAL_TABLET | ORAL | Status: DC | PRN
  Administered 2015-03-29 – 2015-03-30 (×3): 5 mg via ORAL
  Administered 2015-03-30: 15 mg via ORAL
  Administered 2015-03-30: 5 mg via ORAL
  Administered 2015-03-31 (×4): 15 mg via ORAL
  Administered 2015-03-31 (×2): 10 mg via ORAL
  Administered 2015-04-01: 5 mg via ORAL
  Administered 2015-04-01: 15 mg via ORAL
  Filled 2015-03-29: qty 3
  Filled 2015-03-29: qty 2
  Filled 2015-03-29: qty 3
  Filled 2015-03-29 (×2): qty 2
  Filled 2015-03-29 (×2): qty 3
  Filled 2015-03-29 (×3): qty 1
  Filled 2015-03-29 (×3): qty 3
  Filled 2015-03-29: qty 1

## 2015-03-29 MED ORDER — SODIUM CHLORIDE 0.9 % IJ SOLN
INTRAMUSCULAR | Status: AC
  Filled 2015-03-29: qty 10

## 2015-03-29 MED ORDER — LACTATED RINGERS IV SOLN
INTRAVENOUS | Status: DC
  Administered 2015-03-29: 1000 mL via INTRAVENOUS

## 2015-03-29 MED ORDER — ATORVASTATIN CALCIUM 20 MG PO TABS
20.0000 mg | ORAL_TABLET | Freq: Every day | ORAL | Status: DC
  Administered 2015-03-30 – 2015-04-01 (×3): 20 mg via ORAL
  Filled 2015-03-29 (×3): qty 1

## 2015-03-29 MED ORDER — MORPHINE SULFATE (PF) 2 MG/ML IV SOLN: 1.0000 mg | INTRAVENOUS | Status: DC | PRN

## 2015-03-29 MED ORDER — BUPIVACAINE HCL (PF) 0.75 % IJ SOLN
INTRAMUSCULAR | Status: DC | PRN
  Administered 2015-03-29: 2 mL via INTRATHECAL

## 2015-03-29 MED ORDER — METHOCARBAMOL 500 MG PO TABS
500.0000 mg | ORAL_TABLET | Freq: Four times a day (QID) | ORAL | Status: DC | PRN
  Administered 2015-03-29 – 2015-03-31 (×5): 500 mg via ORAL
  Filled 2015-03-29 (×5): qty 1

## 2015-03-29 MED ORDER — WARFARIN - PHARMACIST DOSING INPATIENT: Freq: Every day | Status: DC

## 2015-03-29 MED ORDER — TAMSULOSIN HCL 0.4 MG PO CAPS
0.4000 mg | ORAL_CAPSULE | Freq: Every day | ORAL | Status: DC
Start: 2015-03-30 — End: 2015-04-01
  Administered 2015-03-30 – 2015-03-31 (×2): 0.4 mg via ORAL
  Filled 2015-03-29 (×3): qty 1

## 2015-03-29 MED ORDER — SODIUM CHLORIDE 0.9 % IV SOLN
10.0000 mg | INTRAVENOUS | Status: DC | PRN
  Administered 2015-03-29: 40 ug/min via INTRAVENOUS

## 2015-03-29 MED ORDER — DEXAMETHASONE SODIUM PHOSPHATE 4 MG/ML IJ SOLN
INTRAMUSCULAR | Status: DC | PRN
  Administered 2015-03-29: 10 mg via INTRAVENOUS

## 2015-03-29 MED ORDER — BUPIVACAINE HCL (PF) 0.75 % IJ SOLN
INTRAMUSCULAR | Status: AC
  Administered 2015-03-29 – 2015-03-30 (×4): via EPIDURAL
  Filled 2015-03-29 (×7): qty 20

## 2015-03-29 MED ORDER — ONDANSETRON HCL 4 MG PO TABS: 4.0000 mg | ORAL_TABLET | Freq: Four times a day (QID) | ORAL | Status: DC | PRN

## 2015-03-29 MED ORDER — PROMETHAZINE HCL 25 MG/ML IJ SOLN: 6.2500 mg | INTRAMUSCULAR | Status: DC | PRN

## 2015-03-29 MED ORDER — ONDANSETRON HCL 4 MG/2ML IJ SOLN
INTRAMUSCULAR | Status: DC | PRN
  Administered 2015-03-29: 4 mg via INTRAVENOUS

## 2015-03-29 MED ORDER — SODIUM CHLORIDE 0.9 % IV SOLN
INTRAVENOUS | Status: DC
  Administered 2015-03-29 (×2): via INTRAVENOUS
  Administered 2015-03-30: 20 mL/h via INTRAVENOUS

## 2015-03-29 MED ORDER — EPHEDRINE SULFATE 50 MG/ML IJ SOLN
INTRAMUSCULAR | Status: AC
  Filled 2015-03-29: qty 1

## 2015-03-29 MED ORDER — PHENYLEPHRINE HCL 10 MG/ML IJ SOLN
INTRAMUSCULAR | Status: DC | PRN
  Administered 2015-03-29 (×4): 40 ug via INTRAVENOUS

## 2015-03-29 MED ORDER — HYDROMORPHONE HCL 1 MG/ML IJ SOLN: 0.2500 mg | INTRAMUSCULAR | Status: DC | PRN

## 2015-03-29 MED ORDER — FLECAINIDE ACETATE 50 MG PO TABS
150.0000 mg | ORAL_TABLET | Freq: Two times a day (BID) | ORAL | Status: DC
  Administered 2015-03-29: 150 mg via ORAL
  Filled 2015-03-29 (×3): qty 1

## 2015-03-29 MED ORDER — FENTANYL CITRATE (PF) 100 MCG/2ML IJ SOLN
INTRAMUSCULAR | Status: DC | PRN
  Administered 2015-03-29 (×2): 50 ug via INTRAVENOUS

## 2015-03-29 MED ORDER — TRAMADOL HCL 50 MG PO TABS
50.0000 mg | ORAL_TABLET | Freq: Four times a day (QID) | ORAL | Status: DC | PRN
  Administered 2015-03-30: 100 mg via ORAL
  Filled 2015-03-29: qty 2

## 2015-03-29 MED ORDER — DIPHENHYDRAMINE HCL 50 MG/ML IJ SOLN: 12.5000 mg | INTRAMUSCULAR | Status: DC | PRN

## 2015-03-29 MED ORDER — ACETAMINOPHEN 10 MG/ML IV SOLN
INTRAVENOUS | Status: AC
  Filled 2015-03-29: qty 100

## 2015-03-29 MED ORDER — ONDANSETRON HCL 4 MG/2ML IJ SOLN: 4.0000 mg | Freq: Three times a day (TID) | INTRAMUSCULAR | Status: DC | PRN

## 2015-03-29 MED ORDER — CEFAZOLIN SODIUM-DEXTROSE 2-3 GM-% IV SOLR
2.0000 g | INTRAVENOUS | Status: AC
  Administered 2015-03-29: 2 g via INTRAVENOUS

## 2015-03-29 MED ORDER — MENTHOL 3 MG MT LOZG
1.0000 | LOZENGE | OROMUCOSAL | Status: DC | PRN
  Administered 2015-03-30: 3 mg via ORAL

## 2015-03-29 MED ORDER — DEXAMETHASONE SODIUM PHOSPHATE 10 MG/ML IJ SOLN: 10.0000 mg | Freq: Once | INTRAMUSCULAR | Status: DC

## 2015-03-29 MED ORDER — DIPHENHYDRAMINE HCL 25 MG PO CAPS: 25.0000 mg | ORAL_CAPSULE | ORAL | Status: DC | PRN

## 2015-03-29 MED ORDER — ACETAMINOPHEN 325 MG PO TABS: 650.0000 mg | ORAL_TABLET | Freq: Four times a day (QID) | ORAL | Status: DC | PRN

## 2015-03-29 MED ORDER — ACETAMINOPHEN 10 MG/ML IV SOLN
INTRAVENOUS | Status: DC | PRN
  Administered 2015-03-29: 1000 mg via INTRAVENOUS

## 2015-03-29 SURGICAL SUPPLY — 58 items
BAG ZIPLOCK 12X15 (MISCELLANEOUS) ×6 IMPLANT
BANDAGE ELASTIC 6 VELCRO ST LF (GAUZE/BANDAGES/DRESSINGS) ×6 IMPLANT
BANDAGE ESMARK 6X9 LF (GAUZE/BANDAGES/DRESSINGS) ×2 IMPLANT
BLADE SAG 18X100X1.27 (BLADE) ×3 IMPLANT
BLADE SAW SGTL 11.0X1.19X90.0M (BLADE) ×3 IMPLANT
BLADE SURG SZ10 CARB STEEL (BLADE) ×6 IMPLANT
BNDG COHESIVE 6X5 TAN STRL LF (GAUZE/BANDAGES/DRESSINGS) ×3 IMPLANT
BNDG ESMARK 6X9 LF (GAUZE/BANDAGES/DRESSINGS) ×6
BOWL SMART MIX CTS (DISPOSABLE) ×6 IMPLANT
CAPT KNEE TOTAL 3 ATTUNE ×6 IMPLANT
CEMENT HV SMART SET (Cement) ×12 IMPLANT
CLOSURE WOUND 1/2 X4 (GAUZE/BANDAGES/DRESSINGS) ×2
CUFF TOURN SGL QUICK 34 (TOURNIQUET CUFF) ×4
CUFF TRNQT CYL 34X4X40X1 (TOURNIQUET CUFF) ×2 IMPLANT
DRAPE EXTREMITY BILATERAL (DRAPE) ×3 IMPLANT
DRAPE INCISE IOBAN 66X45 STRL (DRAPES) ×3 IMPLANT
DRAPE POUCH INSTRU U-SHP 10X18 (DRAPES) ×3 IMPLANT
DRAPE U-SHAPE 47X51 STRL (DRAPES) ×9 IMPLANT
DRSG ADAPTIC 3X8 NADH LF (GAUZE/BANDAGES/DRESSINGS) ×6 IMPLANT
DRSG PAD ABDOMINAL 8X10 ST (GAUZE/BANDAGES/DRESSINGS) ×6 IMPLANT
DURAPREP 26ML APPLICATOR (WOUND CARE) ×6 IMPLANT
ELECT REM PT RETURN 9FT ADLT (ELECTROSURGICAL) ×3
ELECTRODE REM PT RTRN 9FT ADLT (ELECTROSURGICAL) ×1 IMPLANT
EVACUATOR 1/8 PVC DRAIN (DRAIN) ×6 IMPLANT
FACESHIELD WRAPAROUND (MASK) ×24 IMPLANT
GAUZE SPONGE 4X4 12PLY STRL (GAUZE/BANDAGES/DRESSINGS) ×6 IMPLANT
GLOVE BIO SURGEON STRL SZ7.5 (GLOVE) ×6 IMPLANT
GLOVE BIO SURGEON STRL SZ8 (GLOVE) ×6 IMPLANT
GLOVE BIOGEL PI IND STRL 8 (GLOVE) ×2 IMPLANT
GLOVE BIOGEL PI INDICATOR 8 (GLOVE) ×4
GOWN STRL REUS W/TWL LRG LVL3 (GOWN DISPOSABLE) ×3 IMPLANT
GOWN STRL REUS W/TWL XL LVL3 (GOWN DISPOSABLE) ×3 IMPLANT
HANDPIECE INTERPULSE COAX TIP (DISPOSABLE) ×2
IMMOBILIZER KNEE 20 (SOFTGOODS) ×9 IMPLANT
IMMOBILIZER KNEE 20 THIGH 36 (SOFTGOODS) ×2 IMPLANT
KIT BASIN OR (CUSTOM PROCEDURE TRAY) ×3 IMPLANT
MANIFOLD NEPTUNE II (INSTRUMENTS) ×3 IMPLANT
NDL SAFETY ECLIPSE 18X1.5 (NEEDLE) ×2 IMPLANT
NEEDLE HYPO 18GX1.5 SHARP (NEEDLE) ×4
NS IRRIG 1000ML POUR BTL (IV SOLUTION) ×3 IMPLANT
PACK TOTAL JOINT (CUSTOM PROCEDURE TRAY) ×3 IMPLANT
PAD ABD 8X10 STRL (GAUZE/BANDAGES/DRESSINGS) ×6 IMPLANT
PADDING CAST COTTON 6X4 STRL (CAST SUPPLIES) ×6 IMPLANT
SET HNDPC FAN SPRY TIP SCT (DISPOSABLE) ×1 IMPLANT
SPONGE LAP 18X18 X RAY DECT (DISPOSABLE) ×3 IMPLANT
STOCKINETTE 8 INCH (MISCELLANEOUS) ×3 IMPLANT
STRIP CLOSURE SKIN 1/2X4 (GAUZE/BANDAGES/DRESSINGS) ×4 IMPLANT
SUCTION FRAZIER 12FR DISP (SUCTIONS) ×3 IMPLANT
SUT MNCRL AB 4-0 PS2 18 (SUTURE) ×6 IMPLANT
SUT VIC AB 2-0 CT1 27 (SUTURE) ×12
SUT VIC AB 2-0 CT1 TAPERPNT 27 (SUTURE) ×6 IMPLANT
SUT VLOC 180 0 24IN GS25 (SUTURE) ×6 IMPLANT
SYR 20CC LL (SYRINGE) ×6 IMPLANT
SYR 50ML LL SCALE MARK (SYRINGE) ×6 IMPLANT
TOWEL OR 17X26 10 PK STRL BLUE (TOWEL DISPOSABLE) ×6 IMPLANT
TRAY FOLEY W/METER SILVER 16FR (SET/KITS/TRAYS/PACK) ×3 IMPLANT
WATER STERILE IRR 1500ML POUR (IV SOLUTION) ×6 IMPLANT
WRAP KNEE MAXI GEL POST OP (GAUZE/BANDAGES/DRESSINGS) ×6 IMPLANT

## 2015-03-29 NOTE — Progress Notes (Signed)
ANTICOAGULATION CONSULT NOTE - Initial Consult  Pharmacy Consult for Warfarin Indication: VTE prophylaxis  No Active Allergies  Patient Measurements: Height: 5\' 10"  (177.8 cm) Weight: 194 lb (87.998 kg) IBW/kg (Calculated) : 73 Heparin Dosing Weight:   Vital Signs: Temp: 97.7 F (36.5 C) (08/17 1516) Temp Source: Oral (08/17 1516) BP: 122/66 mmHg (08/17 1516) Pulse Rate: 69 (08/17 1516)  Labs: No results for input(s): HGB, HCT, PLT, APTT, LABPROT, INR, HEPARINUNFRC, CREATININE, CKTOTAL, CKMB, TROPONINI in the last 72 hours.  Estimated Creatinine Clearance: 68.9 mL/min (by C-G formula based on Cr of 1.13).   Medical History: Past Medical History  Diagnosis Date  . Tremor   . Knee arthropathy   . OSA (obstructive sleep apnea)     mild to moderate refused CPAP  . Mixed hyperlipidemia   . Hypercholesteremia   . Melanoma   . PAF (paroxysmal atrial fibrillation)     CHADS VASC score 2 (age>65 and HTN)  . Arthritis   . ED (erectile dysfunction)   . HTN (hypertension)   . BPH (benign prostatic hyperplasia)   . Melanoma   . Coronary artery disease 09/2014    50% mid to distal LAD, 50-70% ostial first diag, 40% ostial and mid left circ.  Marland Kitchen Hemorrhoids     associated with bowel movements   Assessment: 83 yoM on xarelto for hx PAF and bilateral knee osteoarthritis now s/p bilateral TKA on 8/17 with epidural catheter in place.   --Last dose of xarelto 8/14.   --Plan is to start warfarin on POD1 8/18 for VTE prophylaxis  --Epidural likely out on POD2 8/19.   --Need to f/u plan to transition back to xarelto once epidural bleeding risk gone.     ---Baseline PT/INR and aPTT all WNL.  No issues with CBC.    Goal of Therapy:  INR 2-3 Monitor platelets by anticoagulation protocol: Yes   Plan:  Start warfarin 2.5mg  po x 1 at 9AM on 8/18 Daily PT/INR F/u anticoag plans after epidural removed  Ralene Bathe, PharmD, BCPS 03/29/2015, 4:25 PM  Pager: [phone]

## 2015-03-29 NOTE — Transfer of Care (Signed)
Immediate Anesthesia Transfer of Care Note  Patient: Cody Hodge  Procedure(s) Performed: Procedure(s) with comments: TOTAL KNEE BILATERAL (Bilateral) - spinal anes. placed in OR  Patient Location: PACU  Anesthesia Type:Spinal  Level of Consciousness: awake, alert , oriented and patient cooperative  Airway & Oxygen Therapy: Patient Spontanous Breathing and Patient connected to face mask oxygen  Post-op Assessment: Report given to RN and Post -op Vital signs reviewed and stable  Post vital signs: Reviewed and stable  Last Vitals:  Filed Vitals:   03/29/15 1324  BP: 102/64  Pulse: 75  Temp:   Resp: 14    Complications: No apparent anesthesia complications

## 2015-03-29 NOTE — Progress Notes (Signed)
Utilization review completed.  

## 2015-03-29 NOTE — Anesthesia Postprocedure Evaluation (Signed)
  Anesthesia Post-op Note  Patient: Cody Hodge  Procedure(s) Performed: Procedure(s) (LRB): TOTAL KNEE BILATERAL (Bilateral)  Patient Location: PACU  Anesthesia Type: Spinal/Epidural  Level of Consciousness: awake and alert   Airway and Oxygen Therapy: Patient Spontanous Breathing  Post-op Pain: mild  Post-op Assessment: Post-op Vital signs reviewed, Patient's Cardiovascular Status Stable, Respiratory Function Stable, Patent Airway and No signs of Nausea or vomiting  Last Vitals:  Filed Vitals:   03/29/15 1815  BP: 89/60  Pulse: 80  Temp: 36.6 C  Resp: 17    Post-op Vital Signs: stable   Complications: No apparent anesthesia complications

## 2015-03-29 NOTE — Consult Note (Signed)
Physical Medicine and Rehabilitation Consult  Reason for Consult: B-TKR due to OA bilateral knees Referring Physician: Dr. Wynelle Link.    HPI: Cody Hodge is a 69 y.o. male with history of HTN, CAD, OSA, bilaterl knee pain due to endstage OA. Patient elected to undergo B-TKR on 03/29/15 by Dr. Wynelle Link.  Post op WBAT with CPM used for PROM. PT/OT evaluations to be done today. CIR consulted in anticipation of extensive rehab needs. Patient can see history of 25 or so pre-rehabilitation visits with a personal trainer prior to surgery. He is highly motivated to return to recreational tennis next spring He states he can to straight leg raising with both right and left legs yesterday however today he is unable to do a right straight leg raise.  Patient states his wife is unable to assist physically. We discussed other potential complications of surgery including ileus, urinary retention, and uncontrolled pain. He states his pain is well controlled at the current time with just Tylenol plus his epidural infusion. He feels like the epidural has affected his right lower extremity more today.   Review of Systems  Constitutional: Negative.   HENT: Negative.   Eyes: Negative.   Respiratory: Negative.   Cardiovascular: Negative.   Gastrointestinal: Negative.   Genitourinary: Negative.   Musculoskeletal: Positive for joint pain.  Skin: Negative.   Neurological: Negative.   Endo/Heme/Allergies: Negative.   Psychiatric/Behavioral: Negative.       Past Medical History  Diagnosis Date  . Tremor   . Knee arthropathy   . OSA (obstructive sleep apnea)     mild to moderate refused CPAP  . Mixed hyperlipidemia   . Hypercholesteremia   . Melanoma   . PAF (paroxysmal atrial fibrillation)     CHADS VASC score 2 (age>65 and HTN)  . Arthritis   . ED (erectile dysfunction)   . HTN (hypertension)   . BPH (benign prostatic hyperplasia)   . Melanoma   . Coronary artery disease 09/2014   50% mid to distal LAD, 50-70% ostial first diag, 40% ostial and mid left circ.  Marland Kitchen Hemorrhoids     associated with bowel movements    Past Surgical History  Procedure Laterality Date  . Left and right arthroscopy    . Lasik    . Hydrocele excision / repair    . Left forearm melanoma    . Left heart catheterization with coronary angiogram N/A 10/06/2014    Procedure: LEFT HEART CATHETERIZATION WITH CORONARY ANGIOGRAM;  Surgeon: Sinclair Grooms, MD;  Location: [facility] CATH LAB;  Service: Cardiovascular;  Laterality: N/A;  . Cardiac catheterization      2'16- only ever heart cath   Family History  Problem Relation Age of Onset  . Dementia Mother   . Heart attack Father   . Heart disease Father     Social History:  reports that he has never smoked. He does not have any smokeless tobacco history on file. He reports that he drinks alcohol. His drug history is not on file.    Allergies: No Active Allergies    Medications Prior to Admission  Medication Sig Dispense Refill  . atorvastatin (LIPITOR) 20 MG tablet Take 20 mg by mouth daily.    . Coenzyme Q10 (COQ10) 100 MG CAPS Take 100 mg by mouth at bedtime.    Marland Kitchen diltiazem (CARDIZEM CD) 120 MG 24 hr capsule Take 1 capsule (120 mg total) by mouth daily. 90 capsule 3  . finasteride (PROSCAR) 5  MG tablet Take 5 mg by mouth daily.     . flecainide (TAMBOCOR) 150 MG tablet Take 1 tablet (150 mg total) by mouth 2 (two) times daily. 180 tablet 2  . ibuprofen (ADVIL,MOTRIN) 200 MG tablet Take 400 mg by mouth 2 (two) times daily as needed (pain).    Marland Kitchen OVER THE COUNTER MEDICATION Take 1 tablet by mouth 2 (two) times daily. grapeseed oil 75 mg/resveratrol 30 mg/ vitamin c 100 mg    . rivaroxaban (XARELTO) 20 MG TABS tablet Take 1 tablet (20 mg total) by mouth daily with supper. 30 tablet 6  . tamsulosin (FLOMAX) 0.4 MG CAPS capsule Take 0.4 mg by mouth at bedtime.     . triamcinolone (NASACORT) 55 MCG/ACT AERO nasal inhaler Place 1 spray into both  nostrils at bedtime.    . TURMERIC PO Take 400 mg by mouth 2 (two) times daily.      Home: Home Living Family/patient expects to be discharged to:: Inpatient rehab Living Arrangements: Spouse/significant other  Functional History:   Functional Status:  Mobility:          ADL:    Cognition: Cognition Orientation Level: Oriented X4    Blood pressure 110/68, pulse 79, temperature 97.6 F (36.4 C), temperature source Oral, resp. rate 16, height 5\' 10"  (1.778 m), weight 87.998 kg (194 lb), SpO2 100 %. Physical Exam  Nursing note and vitals reviewed. Constitutional: He is oriented to person, place, and time. He appears well-developed and well-nourished.  HENT:  Head: Normocephalic and atraumatic.  Right Ear: External ear normal.  Left Ear: External ear normal.  Eyes: Conjunctivae and EOM are normal. Pupils are equal, round, and reactive to light.  Musculoskeletal:       Right knee: He exhibits decreased range of motion, swelling and effusion. He exhibits no deformity and normal alignment. Tenderness found.       Left knee: He exhibits decreased range of motion, swelling and effusion. Tenderness found.       Right ankle: Normal. No tenderness.       Left ankle: Normal. No tenderness.       Right lower leg: He exhibits swelling.       Left lower leg: He exhibits swelling.       Right foot: Normal.       Left foot: Normal.  Neurological: He is alert and oriented to person, place, and time.  Motor strength is normal bilateral upper extremities  Patient has 2 minus in the right hip flexor 3 minus knee extensor 4 minus ankle dorsiflexor 3/5 in the left hip flexor 4 minus left knee extensor and 4+ in the left ankle dorsiflexor    Psychiatric: He has a normal mood and affect. His behavior is normal. Judgment and thought content normal.    Results for orders placed or performed during the hospital encounter of 03/29/15 (from the past 24 hour(s))  CBC     Status: Abnormal    Collection Time: 03/30/15  4:35 AM  Result Value Ref Range   WBC 14.6 (H) 4.0 - 10.5 K/uL   RBC 3.91 (L) 4.22 - 5.81 MIL/uL   Hemoglobin 10.8 (L) 13.0 - 17.0 g/dL   HCT 33.3 (L) 39.0 - 52.0 %   MCV 85.2 78.0 - 100.0 fL   MCH 27.6 26.0 - 34.0 pg   MCHC 32.4 30.0 - 36.0 g/dL   RDW 13.5 11.5 - 15.5 %   Platelets 259 150 - 400 K/uL  Basic metabolic panel  Status: Abnormal   Collection Time: 03/30/15  4:35 AM  Result Value Ref Range   Sodium 137 135 - 145 mmol/L   Potassium 3.8 3.5 - 5.1 mmol/L   Chloride 104 101 - 111 mmol/L   CO2 24 22 - 32 mmol/L   Glucose, Bld 150 (H) 65 - 99 mg/dL   BUN 16 6 - 20 mg/dL   Creatinine, Ser 0.96 0.61 - 1.24 mg/dL   Calcium 8.4 (L) 8.9 - 10.3 mg/dL   GFR calc non Af Amer >60 >60 mL/min   GFR calc Af Amer >60 >60 mL/min   Anion gap 9 5 - 15  Protime-INR     Status: None   Collection Time: 03/30/15  4:35 AM  Result Value Ref Range   Prothrombin Time 15.1 11.6 - 15.2 seconds   INR 1.17 0.00 - 1.49   No results found.  Assessment/Plan: Diagnosis: Decline in functional abilities following total knee replacements postoperative day #1 1. Does the need for close, 24 hr/day medical supervision in concert with the patient's rehab needs make it unreasonable for this patient to be served in a less intensive setting? Yes 2. Co-Morbidities requiring supervision/potential complications: Coronary artery disease, Paroxysmal atrial fibrillation, post operative anemia 3. Due to bladder management, bowel management, safety, skin/wound care, disease management, medication administration, pain management and patient education, does the patient require 24 hr/day rehab nursing? Yes 4. Does the patient require coordinated care of a physician, rehab nurse, PT (1-2 hrs/day, 5 days/week) and OT (1-2 hrs/day, 5 days/week) to address physical and functional deficits in the context of the above medical diagnosis(es)? Yes Addressing deficits in the following areas: balance,  endurance, locomotion, strength, transferring, bowel/bladder control, bathing, dressing, feeding, grooming and toileting 5. Can the patient actively participate in an intensive therapy program of at least 3 hrs of therapy per day at least 5 days per week? Potentially 6. The potential for patient to make measurable gains while on inpatient rehab is excellent 7. Anticipated functional outcomes upon discharge from inpatient rehab are modified independent  with PT, modified independent with OT, n/a with SLP. 8. Estimated rehab length of stay to reach the above functional goals is: 5-7 days 9. Does the patient have adequate social supports and living environment to accommodate these discharge functional goals? Yes 10. Anticipated D/C setting: Home 11. Anticipated post D/C treatments: [place] therapy 12. Overall Rehab/Functional Prognosis: excellent  RECOMMENDATIONS: This patient's condition is appropriate for continued rehabilitative care in the following setting: CIR Patient has agreed to participate in recommended program. Yes Note that insurance prior authorization may be required for reimbursement for recommended care.  Comment: If patient progresses to a supervision or modified independent level by postop day 3, would not recommend CIR.    03/30/2015

## 2015-03-29 NOTE — Anesthesia Preprocedure Evaluation (Addendum)
Anesthesia Evaluation  Patient identified by MRN, date of birth, ID band Patient awake    Reviewed: Allergy & Precautions, NPO status , Patient's Chart, lab work & pertinent test results  Airway Mallampati: II  TM Distance: >3 FB Neck ROM: Full    Dental no notable dental hx.    Pulmonary sleep apnea and Continuous Positive Airway Pressure Ventilation ,  breath sounds clear to auscultation  Pulmonary exam normal       Cardiovascular hypertension, Pt. on medications Normal cardiovascular exam+ dysrhythmias Atrial Fibrillation Rhythm:Regular Rate:Normal     Neuro/Psych negative neurological ROS  negative psych ROS   GI/Hepatic negative GI ROS, Neg liver ROS,   Endo/Other  negative endocrine ROS  Renal/GU negative Renal ROS  negative genitourinary   Musculoskeletal negative musculoskeletal ROS (+)   Abdominal   Peds negative pediatric ROS (+)  Hematology negative hematology ROS (+)   Anesthesia Other Findings   Reproductive/Obstetrics negative OB ROS                            Anesthesia Physical Anesthesia Plan  ASA: III  Anesthesia Plan: Combined Spinal and Epidural   Post-op Pain Management:    Induction: Intravenous  Airway Management Planned: Simple Face Mask  Additional Equipment:   Intra-op Plan:   Post-operative Plan:   Informed Consent: I have reviewed the patients History and Physical, chart, labs and discussed the procedure including the risks, benefits and alternatives for the proposed anesthesia with the patient or authorized representative who has indicated his/her understanding and acceptance.   Dental advisory given  Plan Discussed with: CRNA and Surgeon  Anesthesia Plan Comments:        Anesthesia Quick Evaluation

## 2015-03-29 NOTE — Anesthesia Procedure Notes (Addendum)
Epidural Patient location during procedure: holding area  Staffing Anesthesiologist: Jovaughn Wojtaszek Performed by: anesthesiologist   Preanesthetic Checklist Completed: patient identified, site marked, surgical consent, pre-op evaluation, timeout performed, IV checked, risks and benefits discussed, monitors and equipment checked and post-op pain management  Epidural Patient position: sitting Prep: Betadine Patient monitoring: heart rate, continuous pulse ox and blood pressure Location: L3-L4 Injection technique: LOR air  Needle:  Needle type: Hustead  Needle gauge: 17 G Needle length: 9 cm and 9 Needle insertion depth: 6 cm Catheter type: closed end flexible Catheter size: 20 Guage Catheter at skin depth: 12 cm Test dose: negative and 1.5% lidocaine  Additional Notes Test dose 1.5% Lidocaine with epi 1:200,000  Patient tolerated the insertion well without complications.Reason for block:post-op pain management  Spinal Patient location during procedure: OR Staffing Performed by: anesthesiologist  Preanesthetic Checklist Completed: patient identified, site marked, surgical consent, pre-op evaluation, timeout performed, IV checked, risks and benefits discussed and monitors and equipment checked Spinal Block Patient position: sitting Prep: Betadine Patient monitoring: heart rate, continuous pulse ox and blood pressure Injection technique: single-shot Needle Needle type: Spinocan  Needle gauge: 24 G Needle length: 9 cm Additional Notes Expiration date of kit checked and confirmed. Patient tolerated procedure well, without complications.

## 2015-03-29 NOTE — H&P (View-Only) (Signed)
Cody Hodge DOB: May 31, 1946 Married / Language: English / Race: White Male Date of Admission:  03/29/2015 CC: Bilateral Knee Pain History of Present Illness The patient is a 69 year old male who comes in for a preoperative History and Physical. The patient is scheduled for a bilateral total knee arthroplasty to be performed by Dr. Dione Plover. Aluisio, MD at [facility] on 03/29/2015. The patient is a 69 year old male who presents with knee complaints. The patient was seen for a second opinion. The patient reports left knee and right knee symptoms including: pain, swelling, instability, stiffness and soreness which began year(s) ago in association with an established activity (very active playing tennis).The patient feels that the symptoms are worsening. The patient has the current diagnosis of knee osteoarthritis. Prior to being seen today the patient was previously evaluated by a colleague. Previous work-up for this problem has included knee x-rays and arthroscopy. Past treatment for this problem has included intra-articular injection of corticosteroids and nonsteroidal anti-inflammatory drugs (before activity). He states that both knees hurt equally. He is an avid Firefighter and the knees are affecting his ability to play tennis. He still tries to do it once or twice a week, but are really painful afterwards. He has increased pain and swelling after playing. Other activities that he desires have been more difficult also. He is at stage now where he is interested in contemplating surgical treatment. He has previously been treated by Dr. Amada Jupiter with arthroscopies and injections in the past. He is interested in doing both knees at the same time. He is not having any problems with his hip or back. They have been treated conservatively in the past for the above stated problem and despite conservative measures, they continue to have progressive pain and severe functional limitations and  dysfunction. They have failed non-operative management including home exercise, medications, and injections. It is felt that they would benefit from undergoing total joint replacement. Risks and benefits of the procedure have been discussed with the patient and they elect to proceed with surgery. There are no active contraindications to surgery such as ongoing infection or rapidly progressive neurological disease.   Problem List/Past Medical Primary osteoarthritis of right knee (M17.11) Coronary Artery Disease/Heart Disease Mild Hemorrhoids Enlarged prostate Mild Atrial Fibrillation Paroxysmal Hypercholesterolemia  Allergies Lipitor *ANTIHYPERLIPIDEMICS* joint pain  Family History Father MI, Sepsis Mother Dementia  Social History Number of flights of stairs before winded 4-5 Pain Contract no Living situation live with spouse Marital status married Tobacco use never smoker Children 3 Current work status working full time Alcohol use current drinker; drinks beer; only occasionally per week Exercise Exercises weekly; does individual sport Illicit drug use no Drug/Alcohol Rehab (Currently) no Drug/Alcohol Rehab (Previously) no Advance Directives Living Will, Healthcare POA  Medication History Flecainide Acetate (150MG  Tablet, Oral) Active. Finasteride (5MG  Tablet, Oral) Active. Pravastatin Sodium (40MG  Tablet, Oral) Active. Diltiazem HCl ER Coated Beads (120MG  Capsule ER 24HR, Oral) Active. Tamsulosin HCl (0.4MG  Capsule, Oral) Active. Vitamin D (Oral) Specific dose unknown - Active. Turmeric (Oral two times daily) Specific dose unknown - Active. Resveratrol (Oral) Specific dose unknown - Active. Aspirin EC (325MG  Tablet DR, Oral) Active.  Past Surgical History Arthroscopy of Knee bilateral   Review of Systems  General Not Present- Chills, Fatigue, Fever, Memory Loss, Night Sweats, Weight Gain and Weight Loss. Skin Not Present- Eczema,  Hives, Itching, Lesions and Rash. HEENT Not Present- Dentures, Double Vision, Headache, Hearing Loss, Tinnitus and Visual Loss. Respiratory  Not Present- Allergies, Chronic Cough, Coughing up blood, Shortness of breath at rest and Shortness of breath with exertion. Cardiovascular Not Present- Chest Pain, Difficulty Breathing Lying Down, Murmur, Palpitations, Racing/skipping heartbeats and Swelling. Gastrointestinal Not Present- Abdominal Pain, Bloody Stool, Constipation, Diarrhea, Difficulty Swallowing, Heartburn, Jaundice, Loss of appetitie, Nausea and Vomiting. Male Genitourinary Present- Urinary frequency and Urinating at Night. Not Present- Blood in Urine, Discharge, Flank Pain, Incontinence, Painful Urination, Urgency, Urinary Retention and Weak urinary stream. Musculoskeletal Present- Joint Pain. Not Present- Back Pain, Joint Swelling, Morning Stiffness, Muscle Pain, Muscle Weakness and Spasms. Neurological Not Present- Blackout spells, Difficulty with balance, Dizziness, Paralysis, Tremor and Weakness. Psychiatric Not Present- Insomnia.  Vitals  Weight: 193 lb Height: 70.5in Weight was reported by patient. Height was reported by patient. Body Surface Area: 2.07 m Body Mass Index: 27.3 kg/m  BP: 122/68 (Sitting, Right Arm, Standard)  Physical Exam  General Mental Status -Alert, cooperative and good historian. General Appearance-pleasant, Not in acute distress. Orientation-Oriented X3. Build & Nutrition-Well nourished and Well developed.  Head and Neck Head-normocephalic, atraumatic . Neck Global Assessment - supple, no bruit auscultated on the right, no bruit auscultated on the left.  Eye Vision-Wears contact lenses. Pupil - Bilateral-Regular and Round. Motion - Bilateral-EOMI.  Chest and Lung Exam Auscultation Breath sounds - clear at anterior chest wall and clear at posterior chest wall. Adventitious sounds - No Adventitious  sounds.  Cardiovascular Auscultation Rhythm - Regular rate and rhythm. Heart Sounds - S1 WNL and S2 WNL. Murmurs & Other Heart Sounds - Auscultation of the heart reveals - No Murmurs.  Abdomen Palpation/Percussion Tenderness - Abdomen is non-tender to palpation. Rigidity (guarding) - Abdomen is soft. Auscultation Auscultation of the abdomen reveals - Bowel sounds normal.  Male Genitourinary Note: Not done, not pertinent to present illness  Musculoskeletal Note: A well-developed male, alert and oriented, in no apparent distress. Both hips show normal range of motion, but no discomfort. Both knees show no effusion. Range is about 5 to 125 on each knee. There is moderate crepitus on range of motion with tenderness medial greater than lateral with no instability. Pulse, sensation and motor are intact.  RADIOGRAPHS AP and lateral both knees show bone on bone arthritis in medial and patellofemoral compartments of both knees.  Assessment & Plan  Primary osteoarthritis of left knee (M17.12) Primary osteoarthritis of right knee (M17.11) Note:Surgical Plans: Bilateral Total Knee Replacements  Disposition: Rehab  PCP: Dr. Alroy Dust Cards: Dr. Einar Gip - Patient has been seen preoperatively and felt to be stable for surgery.  Topical TXA  Anesthesia Issues: None  Comments: Patietn was instructed to stop the Xarelto 48 hours prior to his surgery.  Signed electronically by Joelene Millin, III PA-C

## 2015-03-29 NOTE — Op Note (Signed)
Pre-operative diagnosis- Osteoarthritis  Bilateral knee(s)  Post-operative diagnosis- Osteoarthritis Bilateral knee(s)  Procedure-  Bilateral  Total Knee Arthroplasty  Surgeon- Dione Plover. Adilee Lemme, MD  Assistant- Molli Barrows, PA-C   Anesthesia-  Spinal and Epidural  EBL-* No blood loss amount entered *   Drains Hemovac  Tourniquet time-  Total Tourniquet Time Documented: Thigh (Right) - 35 minutes Total: Thigh (Right) - 35 minutes  Thigh (Left) - 42 minutes Total: Thigh (Left) - 42 minutes     Complications- None  Condition-PACU - hemodynamically stable.   Brief Clinical Note  Cody Hodge is a 69 y.o. year old male with end stage OA of both knees with progressively worsening pain and dysfunction. He has constant pain, with activity and at rest and significant functional deficits with difficulties even with ADLs. He has had extensive non-op management including analgesics, injections of cortisone and viscosupplements, and home exercise program, but remains in significant pain with significant dysfunction. We discussed replacing both knees in the same setting versus one at a time including procedure, risks, potential complications, rehab course, and pros and cons associated with each and the patient elects to do both knees at the same time. He presents now for BilateralTotal Knee Arthroplasty.     Procedure in detail---   The patient is brought into the operating room and positioned supine on the operating table. After successful administration of  Spinal and Epidural,   a tourniquet is placed high on the  Bilateral thigh(s) and the lower extremities are prepped and draped in the usual sterile fashion. Time out is performed by the operating team and then the  Right lower extremity is wrapped in Esmarch, knee flexed and the tourniquet inflated to 300 mmHg.       A midline incision is made with a ten blade through the subcutaneous tissue to the level of the extensor mechanism. A  fresh blade is used to make a medial parapatellar arthrotomy. Soft tissue over the proximal medial tibia is subperiosteally elevated to the joint line with a knife and into the semimembranosus bursa with a Cobb elevator. Soft tissue over the proximal lateral tibia is elevated with attention being paid to avoiding the patellar tendon on the tibial tubercle. The patella is everted, knee flexed 90 degrees and the ACL and PCL are removed. Findings are bone on bone medial and patellofemoral with massive global osteophytes.        The drill is used to create a starting hole in the distal femur and the canal is thoroughly irrigated with sterile saline to remove the fatty contents. The 5 degree Right  valgus alignment guide is placed into the femoral canal and the distal femoral cutting block is pinned to remove 9 mm off the distal femur. Resection is made with an oscillating saw.      The tibia is subluxed forward and the menisci are removed. The extramedullary alignment guide is placed referencing proximally at the medial aspect of the tibial tubercle and distally along the second metatarsal axis and tibial crest. The block is pinned to remove 65mm off the more deficient medial  side. Resection is made with an oscillating saw. Size 7is the most appropriate size for the tibia and the proximal tibia is prepared with the modular drill and keel punch for that size.      The femoral sizing guide is placed and size 7 is most appropriate. Rotation is marked off the epicondylar axis and confirmed by creating a rectangular flexion gap at 90  degrees. The size 7 cutting block is pinned in this rotation and the anterior, posterior and chamfer cuts are made with the oscillating saw. The intercondylar block is then placed and that cut is made.      Trial size 7 tibial component, trial size 7 posterior stabilized femur and a 8  mm posterior stabilized rotating platform insert trial is placed. Full extension is achieved with excellent  varus/valgus and anterior/posterior balance throughout full range of motion. The patella is everted and thickness measured to be 27  mm. Free hand resection is taken to 15 mm, a 41 template is placed, lug holes are drilled, trial patella is placed, and it tracks normally. Osteophytes are removed off the posterior femur with the trial in place. All trials are removed and the cut bone surfaces prepared with pulsatile lavage. Cement is mixed and once ready for implantation, the size 7 tibial implant, size  7 posterior stabilized femoral component, and the size 41 patella are cemented in place and the patella is held with the clamp. The trial insert is placed and the knee held in full extension.  All extruded cement is removed and once the cement is hard the permanent 8 mm posterior stabilized rotating platform insert is placed into the tibial tray.      The wound is copiously irrigated with saline solution and the extensor mechanism closed over a hemovac drain with #1 V-loc suture. The tourniquet is released for a total tourniquet time of 35  minutes. Flexion against gravity is 140 degrees and the patella tracks normally. Subcutaneous tissue is closed with 2.0 vicryl and subcuticular with running 4.0 Monocryl.       The  Left lower extremity is wrapped in Esmarch, knee flexed and the tourniquet inflated to 300 mmHg.       A midline incision is made with a ten blade through the subcutaneous tissue to the level of the extensor mechanism. A fresh blade is used to make a medial parapatellar arthrotomy. Soft tissue over the proximal medial tibia is subperiosteally elevated to the joint line with a knife and into the semimembranosus bursa with a Cobb elevator. Soft tissue over the proximal lateral tibia is elevated with attention being paid to avoiding the patellar tendon on the tibial tubercle. The patella is everted, knee flexed 90 degrees and the ACL and PCL are removed. Findings are bone on bone medial and  patellofemoral with massive global osteophytes.        The drill is used to create a starting hole in the distal femur and the canal is thoroughly irrigated with sterile saline to remove the fatty contents. The 5 degree Left  valgus alignment guide is placed into the femoral canal and the distal femoral cutting block is pinned to remove 9 mm off the distal femur. Resection is made with an oscillating saw.      The tibia is subluxed forward and the menisci are removed. The extramedullary alignment guide is placed referencing proximally at the medial aspect of the tibial tubercle and distally along the second metatarsal axis and tibial crest. The block is pinned to remove 91mm off the more deficient medial  side. Resection is made with an oscillating saw. Size 7is the most appropriate size for the tibia and the proximal tibia is prepared with the modular drill and keel punch for that size.      The femoral sizing guide is placed and size 7 is most appropriate. Rotation is marked off the epicondylar axis  and confirmed by creating a rectangular flexion gap at 90 degrees. The size 7 cutting block is pinned in this rotation and the anterior, posterior and chamfer cuts are made with the oscillating saw. The intercondylar block is then placed and that cut is made.      Trial size 7 tibial component, trial size 7 posterior stabilized femur and a 7  mm posterior stabilized rotating platform insert trial is placed. Full extension is achieved with excellent varus/valgus and anterior/posterior balance throughout full range of motion. The patella is everted and thickness measured to be 27  mm. Free hand resection is taken to 15 mm, a 41 template is placed, lug holes are drilled, trial patella is placed, and it tracks normally. Osteophytes are removed off the posterior femur with the trial in place. All trials are removed and the cut bone surfaces prepared with pulsatile lavage. Cement is mixed and once ready for implantation,  the size 7 tibial implant, size  7 posterior stabilized femoral component, and the size 41 patella are cemented in place and the patella is held with the clamp. The trial insert is placed and the knee held in full extension.   All extruded cement is removed and once the cement is hard the permanent 7 mm posterior stabilized rotating platform insert is placed into the tibial tray.      The wound is copiously irrigated with saline solution and the extensor mechanism closed over a hemovac drain with #1 V-loc suture. The tourniquet is released for a total tourniquet time of 42  minutes. Flexion against gravity is 140 degrees and the patella tracks normally. Subcutaneous tissue is closed with 2.0 vicryl and subcuticular with running 4.0 Monocryl. The incisions are cleaned and dried and steri-strips and  bulky sterile dressings are applied. The limbs are placed into knee immobilizers and the patient is awakened and transported to recovery in stable condition.            Please note that a surgical assistant was a medical necessity for this procedure in order to perform it in a safe and expeditious manner. Surgical assistant was necessary to retract the ligaments and vital neurovascular structures to prevent injury to them and also necessary for proper positioning of the limb to allow for anatomic placement of the prosthesis.   Dione Plover Zachary Nole, MD    03/29/2015, 12:55 PM

## 2015-03-29 NOTE — Interval H&P Note (Signed)
History and Physical Interval Note:  03/29/2015 10:11 AM  Cody Hodge  has presented today for surgery, with the diagnosis of OA BILATERAL KNEES  The various methods of treatment have been discussed with the patient and family. After consideration of risks, benefits and other options for treatment, the patient has consented to  Procedure(s): TOTAL KNEE BILATERAL (Bilateral) as a surgical intervention .  The patient's history has been reviewed, patient examined, no change in status, stable for surgery.  I have reviewed the patient's chart and labs.  Questions were answered to the patient's satisfaction.     Gearlean Alf

## 2015-03-30 ENCOUNTER — Encounter (HOSPITAL_COMMUNITY): Payer: Self-pay | Admitting: Orthopedic Surgery

## 2015-03-30 DIAGNOSIS — Z96653 Presence of artificial knee joint, bilateral: Secondary | ICD-10-CM

## 2015-03-30 DIAGNOSIS — M170 Bilateral primary osteoarthritis of knee: Principal | ICD-10-CM

## 2015-03-30 LAB — BASIC METABOLIC PANEL
Anion gap: 9 (ref 5–15)
BUN: 16 mg/dL (ref 6–20)
CALCIUM: 8.4 mg/dL — AB (ref 8.9–10.3)
CHLORIDE: 104 mmol/L (ref 101–111)
CO2: 24 mmol/L (ref 22–32)
CREATININE: 0.96 mg/dL (ref 0.61–1.24)
GFR calc non Af Amer: >60 mL/min (ref >60)
GLUCOSE: 150 mg/dL — AB (ref 65–99)
Potassium: 3.8 mmol/L (ref 3.5–5.1)
Sodium: 137 mmol/L (ref 135–145)

## 2015-03-30 LAB — PROTIME-INR
INR: 1.17 (ref 0.00–1.49)
Prothrombin Time: 15.1 seconds (ref 11.6–15.2)

## 2015-03-30 LAB — CBC
HEMATOCRIT: 33.3 % — AB (ref 39.0–52.0)
HEMOGLOBIN: 10.8 g/dL — AB (ref 13.0–17.0)
MCH: 27.6 pg (ref 26.0–34.0)
MCHC: 32.4 g/dL (ref 30.0–36.0)
MCV: 85.2 fL (ref 78.0–100.0)
Platelets: 259 10*3/uL (ref 150–400)
RBC: 3.91 MIL/uL — ABNORMAL LOW (ref 4.22–5.81)
RDW: 13.5 % (ref 11.5–15.5)
WBC: 14.6 10*3/uL — ABNORMAL HIGH (ref 4.0–10.5)

## 2015-03-30 MED ORDER — OXYCODONE HCL 5 MG PO TABS: 5.0000 mg | ORAL_TABLET | ORAL | Status: DC | PRN

## 2015-03-30 MED ORDER — WARFARIN VIDEO: Freq: Once | Status: DC

## 2015-03-30 MED ORDER — METHOCARBAMOL 500 MG PO TABS: 500.0000 mg | ORAL_TABLET | Freq: Four times a day (QID) | ORAL | Status: DC | PRN

## 2015-03-30 MED ORDER — FLECAINIDE ACETATE 100 MG PO TABS
100.0000 mg | ORAL_TABLET | Freq: Two times a day (BID) | ORAL | Status: DC
  Administered 2015-03-30 – 2015-04-01 (×5): 100 mg via ORAL
  Filled 2015-03-30 (×6): qty 1

## 2015-03-30 MED ORDER — TRAMADOL HCL 50 MG PO TABS: 50.0000 mg | ORAL_TABLET | Freq: Four times a day (QID) | ORAL | Status: DC | PRN

## 2015-03-30 MED ORDER — PATIENT'S GUIDE TO USING COUMADIN BOOK
Freq: Once | Status: AC
  Administered 2015-03-30: 16:00:00
  Filled 2015-03-30: qty 1

## 2015-03-30 MED ORDER — DOCUSATE SODIUM 100 MG PO CAPS: 100.0000 mg | ORAL_CAPSULE | Freq: Two times a day (BID) | ORAL | Status: DC

## 2015-03-30 NOTE — Care Management Note (Signed)
Case Management Note  Patient Details  Name: Cody Hodge MRN: [id number] Date of Birth: Aug 08, 1946  Subjective/Objective:                   TOTAL KNEE BILATERAL (Bilateral) Action/Plan: Discharge planning  Expected Discharge Date:  03/31/15              Expected Discharge Plan:  [place]  In-House Referral:     Discharge planning Services  CM Consult  Post Acute Care Choice:    Choice offered to:     DME Arranged:    DME Agency:     HH Arranged:    [place] Agency:     Status of Service:  Completed, signed off  Medicare Important Message Given:    Date Medicare IM Given:    Medicare IM give by:    Date Additional Medicare IM Given:    Additional Medicare Important Message give by:     If discussed at [place] of Stay Meetings, dates discussed:    Additional Comments: CM met with pt to offer choice of home health agency and pt states he is going to [facility].  CM notes [facility] consult has been placed.  No other CM needs were communicated. Dellie Catholic, RN 03/30/2015, 11:15 AM

## 2015-03-30 NOTE — Progress Notes (Signed)
CSW consulted for SNF placement. CSW met with pt this am. Pt reports he is hoping to have rehab at [facility]. CSW will be available to assist with d/c planning needs if [facility] is unable to offer placement.  Werner Lean LCSW [phone]

## 2015-03-30 NOTE — Evaluation (Signed)
Physical Therapy Evaluation Patient Details Name: Cody Hodge MRN: [id number] DOB: [birth year] Today's Date: 03/30/2015   History of Present Illness  69 yo male s/p bil TKAs  Clinical Impression  Pt admitted with above diagnosis. Pt currently with functional limitations due to the deficits listed below (see PT Problem List). Pt will benefit from skilled PT to increase their independence and safety with mobility to allow discharge to the venue listed below.  Pt planning for [facility] level therapies prior to return home; doing well today but limited somewhat d/t glut/proximal thigh numbness secondary to epidural;      Follow Up Recommendations [facility] (pt planning for rehab)    Equipment Recommendations  None recommended by PT    Recommendations for Other Services       Precautions / Restrictions Precautions Precautions: Knee Restrictions Other Position/Activity Restrictions: WBAT      Mobility  Bed Mobility Overal bed mobility: Needs Assistance;+ 2 for safety/equipment Bed Mobility: Supine to Sit     Supine to sit: Min assist     General bed mobility comments: with LEs  Transfers Overall transfer level: Needs assistance Equipment used: Rolling walker (2 wheeled) Transfers: Sit to/from Stand Sit to Stand: Min assist;+2 physical assistance;+2 safety/equipment         General transfer comment: RKI used and no KI on L (I SLR);  cues for safe technique and hand placement  Ambulation/Gait Ambulation/Gait assistance: Min assist;+2 safety/equipment;+2 physical assistance Ambulation Distance (Feet): 5 Feet Assistive device: Rolling walker (2 wheeled) Gait Pattern/deviations: Step-to pattern;Wide base of support     General Gait Details: cuesf or RW position and step length; pt with diminished hip control/balance d/t numbness from epidural  Stairs            Wheelchair Mobility    Modified Rankin (Stroke Patients Only)       Balance Overall balance assessment:  Needs assistance Sitting-balance support: Feet supported;No upper extremity supported Sitting balance-Leahy Scale: Fair     Standing balance support: During functional activity;Bilateral upper extremity supported Standing balance-Leahy Scale: Zero                               Pertinent Vitals/Pain Pain Assessment: No/denies pain    Home Living Family/patient expects to be discharged to:: Inpatient rehab Living Arrangements: Spouse/significant other                    Prior Function Level of Independence: Independent         Comments: very active, plays tennis     Hand Dominance        Extremity/Trunk Assessment   Upper Extremity Assessment: Defer to OT evaluation;Overall WFL for tasks assessed           Lower Extremity Assessment: RLE deficits/detail;LLE deficits/detail RLE Deficits / Details: knee flexion AAROM to 80*; knee extension 2/5; hip flexion 2/5 (epidural); ankle WFL; right gluts and proximal thigh numb LLE Deficits / Details: knee flexion AAROM 90*, knee extension and hip flexion 3/5;  ankle WFL; inctact to light touch except gluts     Communication      Cognition Arousal/Alertness: Awake/alert Behavior During Therapy: WFL for tasks assessed/performed Overall Cognitive Status: Within Functional Limits for tasks assessed                      General Comments      Exercises Total Joint Exercises Ankle Circles/Pumps: AROM;Both;10 reps  Quad Sets: 10 reps;Both;AROM Straight Leg Raises: AROM;Both;5 reps;AAROM      Assessment/Plan    PT Assessment Patient needs continued PT services  PT Diagnosis Difficulty walking   PT Problem List Decreased strength;Decreased range of motion;Decreased activity tolerance;Decreased mobility;Decreased knowledge of use of DME  PT Treatment Interventions DME instruction;Gait training;Functional mobility training;Therapeutic activities;Patient/family education;Therapeutic exercise   PT  Goals (Hodge goals can be found in the Care Plan section) Acute Rehab PT Goals Patient Stated Goal: return to active lifestyle, get back to playing tennis PT Goal Formulation: With patient Time For Goal Achievement: 04/06/15 Potential to Achieve Goals: Good    Frequency 7X/week   Barriers to discharge        Co-evaluation               End of Session Equipment Utilized During Treatment: Gait belt;Right knee immobilizer Activity Tolerance: Patient tolerated treatment well Patient left: with call bell/phone within reach;in chair           Time: 1120-1146 PT Time Calculation (min) (ACUTE ONLY): 26 min   Charges:   PT Evaluation $Initial PT Evaluation Tier I: 1 Procedure PT Treatments $Therapeutic Activity: 8-22 mins   PT G Codes:        Symphony Demuro 04-05-2015, 2:50 PM

## 2015-03-30 NOTE — Progress Notes (Signed)
[place] for Warfarin Indication: VTE prophylaxis  No Active Allergies  Patient Measurements: Height: 5\' 10"  (177.8 cm) Weight: 194 lb (87.998 kg) IBW/kg (Calculated) : 73 Heparin Dosing Weight:   Vital Signs: Temp: 97.6 F (36.4 C) (08/18 0455) Temp Source: Oral (08/18 0957) BP: 107/59 mmHg (08/18 1051) Pulse Rate: 86 (08/18 1051)  Labs:  Recent Labs  03/30/15 0435  HGB 10.8*  HCT 33.3*  PLT 259  LABPROT 15.1  INR 1.17  CREATININE 0.96    Estimated Creatinine Clearance: 81.1 mL/min (by C-G formula based on Cr of 0.96).   Medical History: Past Medical History  Diagnosis Date  . Tremor   . Knee arthropathy   . OSA (obstructive sleep apnea)     mild to moderate refused CPAP  . Mixed hyperlipidemia   . Hypercholesteremia   . Melanoma   . PAF (paroxysmal atrial fibrillation)     CHADS VASC score 2 (age>65 and HTN)  . Arthritis   . ED (erectile dysfunction)   . HTN (hypertension)   . BPH (benign prostatic hyperplasia)   . Melanoma   . Coronary artery disease 09/2014    50% mid to distal LAD, 50-70% ostial first diag, 40% ostial and mid left circ.  Marland Kitchen Hemorrhoids     associated with bowel movements   Assessment: 31 yoM on xarelto for hx PAF and bilateral knee osteoarthritis now s/p bilateral TKA on 8/17 with epidural catheter in place.   --Last dose of xarelto 8/14.   --Plan is to start warfarin on POD1 8/18 for VTE prophylaxis  --Epidural likely out on POD2 8/19.   --Need to f/u plan to transition back to xarelto once epidural bleeding risk gone.     ---Baseline PT/INR and aPTT all WNL.  No issues with CBC.    Today, 03/30/2015  INR = 1.17  CBC: Hgb decreased post-op, pltc WNL  Goal of Therapy:  INR 2-3 Monitor platelets by anticoagulation protocol: Yes   Plan:   warfarin 2.5mg  po x 1 at 9AM on 8/18 with epidural in place (plan is to remove 8/19)  No further warfarin today, f/u redosing 8/19   F/u  anticoag plans after epidural removed  Daily PT/INR  Warfarin book/video  Doreene Eland, PharmD, BCPS.   Pager: [phone] 03/30/2015, 11:23 AM

## 2015-03-30 NOTE — Discharge Instructions (Addendum)
Inpatient Rehab Discharge Instructions  Cody Hodge Discharge date and time: 04/01/2015  1:40 PM   Activities/Precautions/ Functional Status: Activity:  Increase activity slowly as tolerated.  No driving for 6 weeks or until further direction given by your physician.  You cannot drive while taking narcotics.  No lifting or carrying greater than 10 lbs. until further directed by your surgeon. Avoid periods of inactivity such as sitting longer than an hour when not asleep. This helps prevent blood clots.  You may return to work once cleared by Dr. Wynelle Link.   Diet: low fat, low cholesterol diet   Wound Care:  Wash with soap and water. Pat dry. Please use good hand washing techniques.  Do not use any lotions or creams on the incision until instructed by your surgeon.    Functional status:  ___ No restrictions     ___ Walk up steps independently ___ 24/7 supervision/assistance   ___ Walk up steps with assistance ___ Intermittent supervision/assistance  _X__ Bathe/dress independently _X__ Walk with walker    ___ Bathe/dress with assistance ___ Walk Independently    ___ Shower independently ___ Walk with assistance    ___ Shower with assistance _X__ No alcohol     ___ Return to work/school ________  Special Instructions:    My questions have been answered and I understand these instructions. I will adhere to these goals and the provided educational materials after my discharge from the hospital.  Patient/Caregiver Signature _______________________________ Date __________  Clinician Signature _______________________________________ Date __________  Please bring this form and your medication list with you to all your follow-up doctor's appointments.     INSTRUCTIONS AFTER JOINT REPLACEMENT   Remove items at home which could result in a fall. This includes throw rugs or furniture in walking pathways ICE to the affected joint every three hours while awake for 30 minutes at a  time, for at least the first 3-5 days, and then as needed for pain and swelling.  Continue to use ice for pain and swelling. You may notice swelling that will progress down to the foot and ankle.  This is normal after surgery.  Elevate your leg when you are not up walking on it.   Continue to use the breathing machine you got in the hospital (incentive spirometer) which will help keep your temperature down.  It is common for your temperature to cycle up and down following surgery, especially at night when you are not up moving around and exerting yourself.  The breathing machine keeps your lungs expanded and your temperature down.       EXERCISES  Results after joint replacement surgery are often greatly improved when you follow the exercise, range of motion and muscle strengthening exercises prescribed by your doctor. Safety measures are also important to protect the joint from further injury. Any time any of these exercises cause you to have increased pain or swelling, decrease what you are doing until you are comfortable again and then slowly increase them. If you have problems or questions, call your caregiver or physical therapist for advice.   Rehabilitation is important following a joint replacement. After just a few days of immobilization, the muscles of the leg can become weakened and shrink (atrophy).  These exercises are designed to build up the tone and strength of the thigh and leg muscles and to improve motion. Often times heat used for twenty to thirty minutes before working out will loosen up your tissues and help with improving the range of motion  but do not use heat for the first two weeks following surgery (sometimes heat can increase post-operative swelling).   These exercises can be done on a training (exercise) mat, on the floor, on a table or on a bed. Use whatever works the best and is most comfortable for you.    Use music or television while you are exercising so that the  exercises are a pleasant break in your day. This will make your life better with the exercises acting as a break in your routine that you can look forward to.   Perform all exercises about fifteen times, three times per day or as directed.  You should exercise both the operative leg and the other leg as well.    TED HOSE STOCKINGS:  Use stockings on both legs until for at least 2 weeks or as directed by physician office. They may be removed at night for sleeping.  MEDICATIONS:  See your medication summary on the After Visit Summary that nursing will review with you.  You may have some home medications which will be placed on hold until you complete the course of blood thinner medication.  It is important for you to complete the blood thinner medication as prescribed.  PRECAUTIONS:  If you experience chest pain or shortness of breath - call 911 immediately for transfer to the hospital emergency department.   If you develop a fever greater that 101 F, purulent drainage from wound, increased redness or drainage from wound, foul odor from the wound/dressing, or calf pain - CONTACT YOUR SURGEON.                                                   FOLLOW-UP APPOINTMENTS:  If you do not already have a post-op appointment, please call the office for an appointment to be seen by your surgeon.  Guidelines for how soon to be seen are listed in your After Visit Summary, but are typically between 1-4 weeks after surgery.     Information on my medicine - Coumadin   (Warfarin)  This medication education was reviewed with me or my healthcare representative as part of my discharge preparation.  The pharmacist that spoke with me during my hospital stay was:  Clovis Riley, [facility]  Why was Coumadin prescribed for you? Coumadin was prescribed for you because you have a blood clot or a medical condition that can cause an increased risk of forming blood clots. Blood clots can cause serious health problems by  blocking the flow of blood to the heart, lung, or brain. Coumadin can prevent harmful blood clots from forming. As a reminder your indication for Coumadin is:   Blood Clot Prevention After Orthopedic Surgery  What test will check on my response to Coumadin? While on Coumadin (warfarin) you will need to have an INR test regularly to ensure that your dose is keeping you in the desired range. The INR (international normalized ratio) number is calculated from the result of the laboratory test called prothrombin time (PT).  If an INR APPOINTMENT HAS NOT ALREADY BEEN MADE FOR YOU please schedule an appointment to have this lab work done by your health care provider within 7 days. Your INR goal is usually a number between:  2 to 3 or your provider may give you a more narrow range like 2-2.5.  Ask your  health care provider during an office visit what your goal INR is.  What  do you need to  know  About  COUMADIN? Take Coumadin (warfarin) exactly as prescribed by your healthcare provider about the same time each day.  DO NOT stop taking without talking to the doctor who prescribed the medication.  Stopping without other blood clot prevention medication to take the place of Coumadin may increase your risk of developing a new clot or stroke.  Get refills before you run out.  What do you do if you miss a dose? If you miss a dose, take it as soon as you remember on the same day then continue your regularly scheduled regimen the next day.  Do not take two doses of Coumadin at the same time.  Important Safety Information A possible side effect of Coumadin (Warfarin) is an increased risk of bleeding. You should call your healthcare provider right away if you experience any of the following: ? Bleeding from an injury or your nose that does not stop. ? Unusual colored urine (red or dark brown) or unusual colored stools (red or black). ? Unusual bruising for unknown reasons. ? A serious fall or if you hit your head  (even if there is no bleeding).  Some foods or medicines interact with Coumadin (warfarin) and might alter your response to warfarin. To help avoid this: ? Eat a balanced diet, maintaining a consistent amount of Vitamin K. ? Notify your provider about major diet changes you plan to make. ? Avoid alcohol or limit your intake to 1 drink for women and 2 drinks for men per day. (1 drink is 5 oz. wine, 12 oz. beer, or 1.5 oz. liquor.)  Make sure that ANY health care provider who prescribes medication for you knows that you are taking Coumadin (warfarin).  Also make sure the healthcare provider who is monitoring your Coumadin knows when you have started a new medication including herbals and non-prescription products.  Coumadin (Warfarin)  Major Drug Interactions  Increased Warfarin Effect Decreased Warfarin Effect  Alcohol (large quantities) Antibiotics (esp. Septra/Bactrim, Flagyl, Cipro) Amiodarone (Cordarone) Aspirin (ASA) Cimetidine (Tagamet) Megestrol (Megace) NSAIDs (ibuprofen, naproxen, etc.) Piroxicam (Feldene) Propafenone (Rythmol SR) Propranolol (Inderal) Isoniazid (INH) Posaconazole (Noxafil) Barbiturates (Phenobarbital) Carbamazepine (Tegretol) Chlordiazepoxide (Librium) Cholestyramine (Questran) Griseofulvin Oral Contraceptives Rifampin Sucralfate (Carafate) Vitamin K   Coumadin (Warfarin) Major Herbal Interactions  Increased Warfarin Effect Decreased Warfarin Effect  Garlic Ginseng Ginkgo biloba Coenzyme Q10 Green tea St. Johns wort    Coumadin (Warfarin) FOOD Interactions  Eat a consistent number of servings per week of foods HIGH in Vitamin K (1 serving =  cup)  Collards (cooked, or boiled & drained) Kale (cooked, or boiled & drained) Mustard greens (cooked, or boiled & drained) Parsley *serving size only =  cup Spinach (cooked, or boiled & drained) Swiss chard (cooked, or boiled & drained) Turnip greens (cooked, or boiled & drained)  Eat a  consistent number of servings per week of foods MEDIUM-HIGH in Vitamin K (1 serving = 1 cup)  Asparagus (cooked, or boiled & drained) Broccoli (cooked, boiled & drained, or raw & chopped) Brussel sprouts (cooked, or boiled & drained) *serving size only =  cup Lettuce, raw (green leaf, endive, romaine) Spinach, raw Turnip greens, raw & chopped   These websites have more information on Coumadin (warfarin):  FailFactory.se; VeganReport.com.au;

## 2015-03-30 NOTE — Progress Notes (Signed)
POD #1 s/p bil TKR.  S: Doing great. Pain free.   O: Motor intact bil LE. Very little weakness. Epidural site is clean, dry, intact. AFVSS.  A/P: Continue current epidural settings. Will d/c tomorrow morning  -Marcell Barlow

## 2015-03-30 NOTE — Addendum Note (Signed)
Addendum  created 03/30/15 1024 by Montez Hageman, MD   Modules edited: Clinical Notes   Clinical Notes:  File: [id number]

## 2015-03-30 NOTE — Progress Notes (Signed)
   Subjective: 1 Day Post-Op Procedure(s) (LRB): TOTAL KNEE BILATERAL (Bilateral) Patient reports pain as mild.   Patient seen in rounds with Dr. Wynelle Link. Patient is well, and has had no acute complaints or problems. No issues overnight. No SOB or chest pain.  Plan is to go Rehab after hospital stay.  Objective: Vital signs in last 24 hours: Temp:  [97.6 F (36.4 C)-98.2 F (36.8 C)] 97.6 F (36.4 C) (08/18 0455) Pulse Rate:  [67-93] 76 (08/18 0455) Resp:  [12-23] 16 (08/18 0455) BP: (89-160)/(57-81) 103/65 mmHg (08/18 0455) SpO2:  [95 %-100 %] 98 % (08/18 0455) Weight:  [87.998 kg (194 lb)] 87.998 kg (194 lb) (08/17 1516)  Intake/Output from previous day:  Intake/Output Summary (Last 24 hours) at 03/30/15 0727 Last data filed at 03/30/15 0459  Gross per 24 hour  Intake 4261.67 ml  Output   3090 ml  Net 1171.67 ml     Labs:  Recent Labs  03/30/15 0435  HGB 10.8*    Recent Labs  03/30/15 0435  WBC 14.6*  RBC 3.91*  HCT 33.3*  PLT 259    Recent Labs  03/30/15 0435  NA 137  K 3.8  CL 104  CO2 24  BUN 16  CREATININE 0.96  GLUCOSE 150*  CALCIUM 8.4*    Recent Labs  03/30/15 0435  INR 1.17    EXAM General - Patient is Alert and Oriented Extremity - Neurovascular intact No cellulitis present Compartment soft Dressing - dressing C/D/I Motor Function - intact, moving foot and toes well on exam.  Hemovac pulled without difficulty.  Past Medical History  Diagnosis Date  . Tremor   . Knee arthropathy   . OSA (obstructive sleep apnea)     mild to moderate refused CPAP  . Mixed hyperlipidemia   . Hypercholesteremia   . Melanoma   . PAF (paroxysmal atrial fibrillation)     CHADS VASC score 2 (age>65 and HTN)  . Arthritis   . ED (erectile dysfunction)   . HTN (hypertension)   . BPH (benign prostatic hyperplasia)   . Melanoma   . Coronary artery disease 09/2014    50% mid to distal LAD, 50-70% ostial first diag, 40% ostial and mid left circ.    Marland Kitchen Hemorrhoids     associated with bowel movements    Assessment/Plan: 1 Day Post-Op Procedure(s) (LRB): TOTAL KNEE BILATERAL (Bilateral) Principal Problem:   OA (osteoarthritis) of knee  Estimated body mass index is 27.84 kg/(m^2) as calculated from the following:   Height as of this encounter: 5\' 10"  (1.778 m).   Weight as of this encounter: 87.998 kg (194 lb). Advance diet Continue foley due to epidural DVT Prophylaxis - Coumadin   Will plan for continuation of foley due to epidural. Will also hold PT today. Plan to get up tomorrow after anesthesia removes epidural. Plan for rehab likely this weekend.    Ardeen Jourdain, PA-C Orthopaedic Surgery 03/30/2015, 7:27 AM

## 2015-03-30 NOTE — Progress Notes (Signed)
   03/30/15 1453  PT Visit Information  Last PT Received On 03/30/15  Assistance Needed +2  History of Present Illness 69 yo male s/p bil TKAs  PT Time Calculation  PT Start Time (ACUTE ONLY) 1355  PT Stop Time (ACUTE ONLY) 1445  PT Time Calculation (min) (ACUTE ONLY) 50 min  Subjective Data  Subjective I can't lift my leg with these packs  Patient Stated Goal return to active lifestyle, get back to playing tennis  Precautions  Precautions Knee  Required Braces or Orthoses Knee Immobilizer - Right;Knee Immobilizer - Left  Knee Immobilizer - Right Discontinue once straight leg raise with < 10 degree lag  Knee Immobilizer - Left Discontinue once straight leg raise with < 10 degree lag  Restrictions  Weight Bearing Restrictions No  Other Position/Activity Restrictions WBAT  Pain Assessment  Pain Assessment 0-10  Pain Score 2  Pain Location L knee  Pain Descriptors / Indicators Discomfort  Pain Intervention(s) Limited activity within patient's tolerance;Monitored during session;Premedicated before session;Repositioned;Ice applied  Cognition  Arousal/Alertness Awake/alert  Behavior During Therapy WFL for tasks assessed/performed  Overall Cognitive Status Within Functional Limits for tasks assessed  Bed Mobility  Overal bed mobility Needs Assistance  Bed Mobility Sit to Supine  Sit to supine Min assist  General bed mobility comments with RLE, cues for safety  Transfers  Overall transfer level Needs assistance  Equipment used Rolling walker (2 wheeled)  Transfers Sit to/from Stand  Sit to Stand Min assist;+2 physical assistance;+2 safety/equipment  General transfer comment RKI used and no KI on L (I SLR);  cues for safe technique and hand placement  Ambulation/Gait  Ambulation/Gait assistance Min assist;+2 physical assistance;+2 safety/equipment  Ambulation Distance (Feet) 6 Feet  Assistive device Rolling walker (2 wheeled)  Gait Pattern/deviations Step-to pattern;Wide base of  support;Trunk flexed  General Gait Details cues for RW position and step length; pt with diminished hip control on right d/t numbness from epidural (although improved compared to am session)  Balance  Overall balance assessment Needs assistance  Standing balance support During functional activity;Bilateral upper extremity supported  Standing balance-Leahy Scale Poor  General Comments  General comments (skin integrity, edema, etc.) pt with bleeding from drain sites, bil; reinforced dsg with RN bil LE this pm  Total Joint Exercises  Ankle Circles/Pumps AROM;Both;10 reps  Quad Sets 10 reps;Both;AROM  Straight Leg Raises AROM;AAROM;Both;10 reps  Heel Slides AAROM;Both;10 reps  Short Arc [employer];Both;Strengthening;10 reps  PT - End of Session  Equipment Utilized During Treatment Gait belt;Right knee immobilizer  Activity Tolerance Patient tolerated treatment well  Patient left in bed;with call bell/phone within reach  Nurse Communication Mobility status  PT - Assessment/Plan  PT Plan Current plan remains appropriate  PT Frequency (ACUTE ONLY) 7X/week  Follow Up Recommendations CIR  PT equipment None recommended by PT  PT Goal Progression  Progress towards PT goals Progressing toward goals  Acute Rehab PT Goals  PT Goal Formulation With patient  Time For Goal Achievement 04/06/15  Potential to Achieve Goals Good  PT General Charges  $$ ACUTE PT VISIT 1 Procedure  PT Treatments  $Therapeutic Exercise 23-37 mins  $Therapeutic Activity 8-22 mins

## 2015-03-31 LAB — CBC
HCT: 29.1 % — ABNORMAL LOW (ref 39.0–52.0)
HEMOGLOBIN: 9.6 g/dL — AB (ref 13.0–17.0)
MCH: 27.5 pg (ref 26.0–34.0)
MCHC: 33 g/dL (ref 30.0–36.0)
MCV: 83.4 fL (ref 78.0–100.0)
Platelets: 270 10*3/uL (ref 150–400)
RBC: 3.49 MIL/uL — ABNORMAL LOW (ref 4.22–5.81)
RDW: 13.7 % (ref 11.5–15.5)
WBC: 21.7 10*3/uL — ABNORMAL HIGH (ref 4.0–10.5)

## 2015-03-31 LAB — BASIC METABOLIC PANEL
Anion gap: 8 (ref 5–15)
BUN: 24 mg/dL — AB (ref 6–20)
CALCIUM: 8.6 mg/dL — AB (ref 8.9–10.3)
CHLORIDE: 101 mmol/L (ref 101–111)
CO2: 28 mmol/L (ref 22–32)
CREATININE: 0.99 mg/dL (ref 0.61–1.24)
GFR calc Af Amer: >60 mL/min (ref >60)
GFR calc non Af Amer: >60 mL/min (ref >60)
Glucose, Bld: 135 mg/dL — ABNORMAL HIGH (ref 65–99)
Potassium: 3.9 mmol/L (ref 3.5–5.1)
SODIUM: 137 mmol/L (ref 135–145)

## 2015-03-31 LAB — PROTIME-INR
INR: 1.25 (ref 0.00–1.49)
Prothrombin Time: 15.8 seconds — ABNORMAL HIGH (ref 11.6–15.2)

## 2015-03-31 MED ORDER — ENOXAPARIN SODIUM 30 MG/0.3ML ~~LOC~~ SOLN
30.0000 mg | Freq: Two times a day (BID) | SUBCUTANEOUS | Status: DC
  Administered 2015-03-31 – 2015-04-01 (×2): 30 mg via SUBCUTANEOUS
  Filled 2015-03-31 (×3): qty 0.3

## 2015-03-31 MED ORDER — WARFARIN SODIUM 5 MG PO TABS
5.0000 mg | ORAL_TABLET | Freq: Once | ORAL | Status: AC
  Administered 2015-03-31: 5 mg via ORAL
  Filled 2015-03-31: qty 1

## 2015-03-31 MED ORDER — ENOXAPARIN SODIUM 30 MG/0.3ML ~~LOC~~ SOLN: 30.0000 mg | Freq: Two times a day (BID) | SUBCUTANEOUS | Status: DC

## 2015-03-31 NOTE — Progress Notes (Signed)
Rehab admissions - Evaluated for possible admission.  I met with patient and his wife at the bedside.  I gave them rehab booklets and explained inpatient rehab to them.  I answered questions.  Patient would like to admit to acute inpatient rehab.  I will have a bed available tomorrow, Saturday, and will plan to admit tomorrow if patient is medically stable.  Call me for questions.  #[phone]

## 2015-03-31 NOTE — Progress Notes (Signed)
03/30/15 2220 Nursing Dr. Aris Lot called reg patient's concern that epidural is not working well. Epidural sight is intact and infusing. Explained to patient that he may need additional pain meds along with the epidural. Started patient on oxycodone and robaxin. Pt rates his pain at a 3 or 4 consistently. No orders received.

## 2015-03-31 NOTE — Progress Notes (Signed)
Physical Therapy Treatment Patient Details Name: Cody Hodge MRN: [id number] DOB: 1946/04/16 Today's Date: 03/31/2015    History of Present Illness 69 yo male s/p bil TKAs    PT Comments    Patient is progressing well. Ambulated with only R ki. Plans CIR soon.  Follow Up Recommendations  CIR     Equipment Recommendations  None recommended by PT    Recommendations for Other Services       Precautions / Restrictions Precautions Precautions: Knee;Fall Required Braces or Orthoses: Knee Immobilizer - Right;Knee Immobilizer - Left Knee Immobilizer - Right: Discontinue once straight leg raise with < 10 degree lag Knee Immobilizer - Left: Discontinue once straight leg raise with < 10 degree lag Restrictions Other Position/Activity Restrictions: WBAT    Mobility  Bed Mobility   Bed Mobility: Sit to Supine       Sit to supine: Min assist   General bed mobility comments: assist legs onto bed, no KI  Transfers   Equipment used: Rolling walker (2 wheeled) Transfers: Sit to/from Stand Sit to Stand: Mod assist;+2 safety/equipment         General transfer comment: lifting assist from lower recliner, cues for hands and walk legs backward.  Ambulation/Gait Ambulation/Gait assistance: Min assist;+2 safety/equipment Ambulation Distance (Feet): 70 Feet Assistive device: Rolling walker (2 wheeled) Gait Pattern/deviations: Step-to pattern;Step-through pattern;Trunk flexed     General Gait Details: cues for position inside of RW, ambulated without L KI and tolerated well.   Stairs            Wheelchair Mobility    Modified Rankin (Stroke Patients Only)       Balance                                    Cognition Arousal/Alertness: Awake/alert                          Exercises Total Joint Exercises Ankle Circles/Pumps: AROM;Both;10 reps;Supine Quad Sets: 10 reps;Both;AROM;Supine Towel Squeeze: AROM;10 reps;Supine Short Arc  Quad: AROM;Both;15 reps;Supine Hip ABduction/ADduction: AROM;Both;10 reps;Supine Straight Leg Raises: AROM;AAROM;15 reps;Supine Long Arc Quad: AROM;Both;10 reps;Seated Knee Flexion: AROM;Both;10 reps;Seated Goniometric ROM: L=10-60, R=10-50 knee flexion    General Comments        Pertinent Vitals/Pain Pain Score: 2  Pain Location: each knee Pain Descriptors / Indicators: Tightness;Aching Pain Intervention(s): Limited activity within patient's tolerance;Monitored during session;Premedicated before session;Repositioned;Ice applied    Home Living                      Prior Function            PT Goals (current goals can now be found in the care plan section) Progress towards PT goals: Progressing toward goals    Frequency  7X/week    PT Plan Current plan remains appropriate    Co-evaluation PT/OT/SLP Co-Evaluation/Treatment: Yes Reason for Co-Treatment: For patient/therapist safety PT goals addressed during session: Mobility/safety with mobility       End of Session Equipment Utilized During Treatment: Gait belt;Right knee immobilizer Activity Tolerance: Patient tolerated treatment well Patient left: in bed;with call bell/phone within reach;with family/visitor present     Time: 1448-1530 PT Time Calculation (min) (ACUTE ONLY): 42 min  Charges:  $Gait Training: 23-37 mins $Therapeutic Exercise: 8-22 mins  G Codes:      Claretha Cooper 03/31/2015, 4:33 PM Tresa Endo PT [phone]

## 2015-03-31 NOTE — Evaluation (Signed)
Occupational Therapy Evaluation Patient Details Name: Cody Hodge MRN: [id number] DOB: [birth year] Today's Date: 03/31/2015    History of Present Illness 69 yo male s/p bil TKAs   Clinical Impression   Pt was admitted for the above surgeries.  He will benefit from skilled OT to increase safety and independence with adls.  Goals in acute are for supervision to min A.      Follow Up Recommendations  CIR    Equipment Recommendations  3 in 1 bedside comode    Recommendations for Other Services       Precautions / Restrictions Precautions Precautions: Knee Knee Immobilizer - Right: Discontinue once straight leg raise with < 10 degree lag Knee Immobilizer - Left: Discontinue once straight leg raise with < 10 degree lag Restrictions Weight Bearing Restrictions: No      Mobility Bed Mobility         Supine to sit: Min assist     General bed mobility comments: assist for LEs  Transfers   Equipment used: Rolling walker (2 wheeled)   Sit to Stand: Min assist;+2 safety/equipment         General transfer comment: Bed raised high. Assist to rise and steady.      Balance                                            ADL Overall ADL's : Needs assistance/impaired     Grooming: Set up;Sitting   Upper Body Bathing: Set up;Sitting   Lower Body Bathing: Moderate assistance;Sit to/from stand;+2 safety   Upper Body Dressing : Set up;Sitting   Lower Body Dressing: Maximal assistance;Sit to/from stand;+2 safety   Toilet Transfer: Minimal assistance;+2 for safety/equipment;Ambulation (recliner)             General ADL Comments: Pt has AE kit but has not used it.  Educated on use of this.  Pt using arms heavily on RW:  not released for ADLs     Vision     Perception     Praxis      Pertinent Vitals/Pain Pain Assessment: 0-10 Pain Score: 4  Pain Location: bil knees Pain Descriptors / Indicators: Aching Pain Intervention(s): Limited  activity within patient's tolerance;Monitored during session;Premedicated before session;Repositioned;Ice applied     Hand Dominance     Extremity/Trunk Assessment Upper Extremity Assessment Upper Extremity Assessment: Overall WFL for tasks assessed           Communication Communication Communication: No difficulties   Cognition Arousal/Alertness: Awake/alert Behavior During Therapy: WFL for tasks assessed/performed Overall Cognitive Status: Within Functional Limits for tasks assessed                     General Comments       Exercises       Shoulder Instructions      Home Living Family/patient expects to be discharged to:: Inpatient rehab                                 Additional Comments: has borrowed shower seat and AE kit.  Does not have DME for low commode      Prior Functioning/Environment Level of Independence: Independent             OT Diagnosis: Generalized weakness   OT Problem List: Decreased strength;Decreased  activity tolerance;Decreased knowledge of use of DME or AE;Pain   OT Treatment/Interventions: Self-care/ADL training;DME and/or AE instruction;Patient/family education    OT Goals(Current goals can be found in the care plan section) Acute Rehab OT Goals Patient Stated Goal: go to rehab then home OT Goal Formulation: With patient Time For Goal Achievement: 04/07/15 Potential to Achieve Goals: Good ADL Goals Pt Will Perform Grooming: standing;with min guard assist Pt Will Perform Lower Body Bathing: with min guard assist;with adaptive equipment;sit to/from stand Pt Will Perform Lower Body Dressing: with min assist;with adaptive equipment;sit to/from stand Pt Will Transfer to Toilet: with min guard assist;ambulating;bedside commode Pt Will Perform Toileting - Clothing Manipulation and hygiene: with min guard assist;sit to/from stand  OT Frequency: Min 2X/week   Barriers to D/C:            Co-evaluation  PT/OT/SLP Co-Evaluation/Treatment: Yes Reason for Co-Treatment: For patient/therapist safety PT goals addressed during session: Mobility/safety with mobility OT goals addressed during session: ADL's and self-care      End of Session    Activity Tolerance: Patient limited by fatigue (when pt sat back down, c/o dizziness which resolved) Patient left: in chair;with call bell/phone within reach   Time: 1015-1040 OT Time Calculation (min): 25 min Charges:  OT General Charges $OT Visit: 1 Procedure OT Evaluation $Initial OT Evaluation Tier I: 1 Procedure G-Codes:    Juno Alers 04-23-2015, 10:51 AM  Lesle Chris, OTR/L [phone] Apr 23, 2015

## 2015-03-31 NOTE — PMR Pre-admission (Signed)
PMR Admission Coordinator Pre-Admission Assessment  Patient: Cody Hodge is an 69 y.o., male MRN: [id number] DOB: [birth year] Height: 5\' 10"  (177.8 cm) Weight: 87.998 kg (194 lb)              Insurance Information HMO: No    PPO:       PCP:       IPA:       80/20:       OTHER:   PRIMARY: Medicare A/B      Policy#: [id number]      Subscriber: Marjorie Smolder CM Name:        Phone#:       Fax#:   Pre-Cert#:        Employer: Retired but works PT as a Geneticist, molecular:  Phone #:       Name: Checked in [place]. Date: 04/12/10=A and 09/12/10=B     Deduct: $1288      Out of Pocket Max: none      Life Max: unlimited CIR: 100%      SNF: 100 days Outpatient: 80%     Co-Pay: 20% Home Health: 100%      Co-Pay: none DME: 80%     Co-Pay: 20% Providers: patient's choice  SECONDARY: [employer]      Policy#: [id number]      Subscriber: Marjorie Smolder CM Name:        Phone#:       Fax#:   Pre-Cert#:        Employer: REtired but works PT Benefits:  Phone #: [phone]     Name:   Eff. Date:       Deduct:        Out of Pocket Max:        Life Max:   CIR:        SNF:   Outpatient:       Co-Pay:   Home Health:        Co-Pay:   DME:       Co-Pay:    Emergency Contact Information Contact Information    Name Relation Home Work Mobile   Nordby,Chris Spouse [phone]  [phone]   [place] Daughter   [phone]     Current Medical History  Patient Admitting Diagnosis:  B TKR  History of Present Illness: A 69 y.o. male with history of HTN, CAD, OSA, bilaterl knee pain due to endstage OA. Patient elected to undergo B-TKR on 03/29/15 by Dr. Wynelle Link. Post op WBAT with CPM used for PROM. PT/OT evaluations to be done today. CIR consulted in anticipation of extensive rehab needs. Patient completed 24 pre-rehabilitation visits with a personal trainer prior to surgery. He is highly motivated to return to recreational tennis next spring. He states he can to straight leg raising with  both right and left legs yesterday however today he is unable to do a right straight leg raise.    Past Medical History  Past Medical History  Diagnosis Date  . Tremor   . Knee arthropathy   . OSA (obstructive sleep apnea)     mild to moderate refused CPAP  . Mixed hyperlipidemia   . Hypercholesteremia   . Melanoma   . PAF (paroxysmal atrial fibrillation)     CHADS VASC score 2 (age>65 and HTN)  . Arthritis   . ED (erectile dysfunction)   . HTN (hypertension)   . BPH (benign prostatic hyperplasia)   . Melanoma   . Coronary artery disease 09/2014  50% mid to distal LAD, 50-70% ostial first diag, 40% ostial and mid left circ.  Marland Kitchen Hemorrhoids     associated with bowel movements    Family History  family history includes Dementia in his mother; Heart attack in his father; Heart disease in his father.  Prior Rehab/Hospitalizations:  Has the patient had major surgery during 100 days prior to admission? No.  He did have a cardiac cath in 02/16.  Current Medications   Current facility-administered medications:  .  0.9 %  sodium chloride infusion, , Intravenous, Continuous, Gaynelle Arabian, MD, Last Rate: 20 mL/hr at 03/30/15 0910, 20 mL/hr at 03/30/15 0910 .  acetaminophen (TYLENOL) tablet 650 mg, 650 mg, Oral, Q6H PRN **OR** acetaminophen (TYLENOL) suppository 650 mg, 650 mg, Rectal, Q6H PRN, Gaynelle Arabian, MD .  atorvastatin (LIPITOR) tablet 20 mg, 20 mg, Oral, Daily, Gaynelle Arabian, MD, 20 mg at 03/31/15 0935 .  bisacodyl (DULCOLAX) suppository 10 mg, 10 mg, Rectal, Daily PRN, Gaynelle Arabian, MD .  diltiazem (CARDIZEM CD) 24 hr capsule 120 mg, 120 mg, Oral, Daily, Gaynelle Arabian, MD, 120 mg at 03/31/15 0935 .  diphenhydrAMINE (BENADRYL) 12.5 MG/5ML elixir 12.5-25 mg, 12.5-25 mg, Oral, Q4H PRN, Gaynelle Arabian, MD .  diphenhydrAMINE (BENADRYL) injection 12.5 mg, 12.5 mg, Intravenous, Q4H PRN **OR** diphenhydrAMINE (BENADRYL) capsule 25 mg, 25 mg, Oral, Q4H PRN, Myrtie Soman, MD .  docusate  sodium (COLACE) capsule 100 mg, 100 mg, Oral, BID, Gaynelle Arabian, MD, 100 mg at 03/31/15 0935 .  enoxaparin (LOVENOX) injection 30 mg, 30 mg, Subcutaneous, Q12H, Amber Constable, PA-C .  finasteride (PROSCAR) tablet 5 mg, 5 mg, Oral, Daily, Gaynelle Arabian, MD, 5 mg at 03/31/15 0935 .  flecainide (TAMBOCOR) tablet 100 mg, 100 mg, Oral, BID, Amber Constable, PA-C, 100 mg at 03/31/15 0935 .  menthol-cetylpyridinium (CEPACOL) lozenge 3 mg, 1 lozenge, Oral, PRN, 3 mg at 03/30/15 0847 **OR** phenol (CHLORASEPTIC) mouth spray 1 spray, 1 spray, Mouth/Throat, PRN, Gaynelle Arabian, MD .  methocarbamol (ROBAXIN) tablet 500 mg, 500 mg, Oral, Q6H PRN, 500 mg at 03/31/15 1140 **OR** methocarbamol (ROBAXIN) 500 mg in dextrose 5 % 50 mL IVPB, 500 mg, Intravenous, Q6H PRN, Gaynelle Arabian, MD, 500 mg at 03/30/15 2157 .  metoCLOPramide (REGLAN) tablet 5-10 mg, 5-10 mg, Oral, Q8H PRN **OR** metoCLOPramide (REGLAN) injection 5-10 mg, 5-10 mg, Intravenous, Q8H PRN, Gaynelle Arabian, MD .  morphine 2 MG/ML injection 1-2 mg, 1-2 mg, Intravenous, Q2H PRN, Gaynelle Arabian, MD .  nalbuphine (NUBAIN) 20 MG/ML injection 5 mg, 5 mg, Intravenous, Q4H PRN **OR** nalbuphine (NUBAIN) 20 MG/ML injection 5 mg, 5 mg, Subcutaneous, Q4H PRN, Myrtie Soman, MD .  nalbuphine (NUBAIN) injection 5 mg, 5 mg, Intravenous, Once PRN **OR** nalbuphine (NUBAIN) injection 5 mg, 5 mg, Subcutaneous, Once PRN, Myrtie Soman, MD .  naloxone [facility]) 2 mg in dextrose 5 % 250 mL infusion, 1-4 mcg/kg/hr, Intravenous, Continuous PRN, Myrtie Soman, MD .  naloxone [facility]) injection 0.4 mg, 0.4 mg, Intravenous, PRN **AND** sodium chloride 0.9 % injection 3 mL, 3 mL, Intravenous, PRN, Myrtie Soman, MD .  ondansetron [facility]) injection 4 mg, 4 mg, Intravenous, Q8H PRN, Myrtie Soman, MD .  ondansetron (ZOFRAN) tablet 4 mg, 4 mg, Oral, Q6H PRN **OR** ondansetron (ZOFRAN) injection 4 mg, 4 mg, Intravenous, Q6H PRN, Gaynelle Arabian, MD .  oxyCODONE (Oxy IR/ROXICODONE) immediate  release tablet 5-20 mg, 5-20 mg, Oral, Q3H PRN, Gaynelle Arabian, MD, 10 mg at 03/31/15 1140 .  polyethylene glycol (MIRALAX / GLYCOLAX) packet 17 g,  17 g, Oral, Daily PRN, Gaynelle Arabian, MD, 17 g at 03/31/15 0935 .  scopolamine (TRANSDERM-SCOP) 1 MG/3DAYS 1.5 mg, 1 patch, Transdermal, Once, Myrtie Soman, MD, Stopped at 03/29/15 1640 .  sodium phosphate (FLEET) 7-19 GM/118ML enema 1 enema, 1 enema, Rectal, Once PRN, Gaynelle Arabian, MD .  tamsulosin [facility]) capsule 0.4 mg, 0.4 mg, Oral, QHS, Gaynelle Arabian, MD, 0.4 mg at 03/30/15 2047 .  traMADol (ULTRAM) tablet 50-100 mg, 50-100 mg, Oral, Q6H PRN, Gaynelle Arabian, MD, 100 mg at 03/30/15 1853 .  triamcinolone (NASACORT) nasal inhaler 1 spray, 1 spray, Each Nare, QHS, Gaynelle Arabian, MD, 1 spray at 03/30/15 2048 .  warfarin (COUMADIN) tablet 5 mg, 5 mg, Oral, ONCE-1800, Dustin G Zeigler, RPH .  warfarin (COUMADIN) video, , Does not apply, Once, Berton Mount, RPH .  Warfarin - Pharmacist Dosing Inpatient, , Does not apply, q1800, Gaynelle Arabian, MD  Patients Current Diet: Diet regular Room service appropriate?: Yes; Fluid consistency:: Thin Diet - low sodium heart healthy  Precautions / Restrictions Precautions Precautions: Knee, Fall Restrictions Weight Bearing Restrictions: No Other Position/Activity Restrictions: WBAT   Has the patient had 2 or more falls or a fall with injury in the past year?No  Prior Activity Level Community (5-7x/wk): Went out daily.  Still working daily as a Chief Executive Officer.  Plays tennis 1-2 X a week.  Home Assistive Devices / Equipment Home Assistive Devices/Equipment: Recruitment consultant, Environmental consultant (specify type), Shower chair with back  Prior Device Use: Indicate devices/aids used by the patient prior to current illness, exacerbation or injury? None of the above.  No devices in use.  Prior Functional Level Prior Function Level of Independence: Independent Comments: very active, plays tennis  Self Care: Did the patient need  help bathing, dressing, using the toilet or eating?  Independent  Indoor Mobility: Did the patient need assistance with walking from room to room (with or without device)? Independent  Stairs: Did the patient need assistance with internal or external stairs (with or without device)? Independent  Functional Cognition: Did the patient need help planning regular tasks such as shopping or remembering to take medications? Independent  Current Functional Level Cognition  Overall Cognitive Status: Within Functional Limits for tasks assessed Orientation Level: Oriented X4    Extremity Assessment (includes Sensation/Coordination)  Upper Extremity Assessment: Overall WFL for tasks assessed  Lower Extremity Assessment: RLE deficits/detail, LLE deficits/detail RLE Deficits / Details: knee flexion AAROM to 80*; knee extension 2/5; hip flexion 2/5 (epidural); ankle WFL; right gluts and proximal thigh numb RLE Sensation: decreased light touch LLE Deficits / Details: knee flexion AAROM 90*, knee extension and hip flexion 3/5;  ankle WFL; inctact to light touch except gluts    ADLs  Overall ADL's : Needs assistance/impaired Grooming: Set up, Sitting Upper Body Bathing: Set up, Sitting Lower Body Bathing: Moderate assistance, Sit to/from stand Upper Body Dressing : Set up, Sitting Lower Body Dressing: Maximal assistance, Sit to/from stand Toilet Transfer: Minimal assistance, +2 for safety/equipment, Ambulation (recliner) General ADL Comments: Pt has AE kit but has not used it.  Educated on use of this.  Pt using arms heavily on RW:  not released for ADLs    Mobility  Overal bed mobility: Needs Assistance Bed Mobility: Sit to Supine Supine to sit: Min assist Sit to supine: Min assist General bed mobility comments: assist for LEs    Transfers  Overall transfer level: Needs assistance Equipment used: Rolling walker (2 wheeled) Transfers: Sit to/from Stand Sit to Stand: Min assist, +2  safety/equipment General transfer comment: Bed raised high. Assist to rise and steady.  cues on technique, how to walk legs backward to stand, then walk legs forward to sit down     Ambulation / Gait / Stairs / Wheelchair Mobility  Ambulation/Gait Ambulation/Gait assistance: Min assist, +2 physical assistance, +2 safety/equipment Ambulation Distance (Feet): 50 Feet Assistive device: Rolling walker (2 wheeled) Gait Pattern/deviations: Step-to pattern, Trunk flexed General Gait Details: cues for position inside of RW, to stay back from front for balance.    Posture / Balance Balance Overall balance assessment: Needs assistance Sitting-balance support: Feet supported, No upper extremity supported Sitting balance-Leahy Scale: Fair Standing balance support: During functional activity, Bilateral upper extremity supported Standing balance-Leahy Scale: Poor    Special needs/care consideration BiPAP/CPAP: No  CPM Yes, bilateral Continuous Drip IV No Dialysis No     Life Vest No Oxygen No Special Bed No Trach Size No Wound Vac (area) No      Skin B TKR incisions with dressings and knee immobilizers in place.                           Bowel mgmt: Las BM 03/29/15 before surgery. Bladder mgmt: Foley catheter to be removed 08/19 at 2:30 pm. Diabetic mgmt No    Previous Home Environment Living Arrangements: Spouse/significant other Home Care Services: No Additional Comments: has borrowed shower seat and AE kit.  Does not have DME for low commode  Discharge Living Setting Plans for Discharge Living Setting: Patient's home, Lives with (comment), Other (Comment) (Lives with wife in a condominium.) Type of Home at Discharge: Other (Comment) (Condominium) Discharge Home Layout: One level Discharge Home Access: Level entry Does the patient have any problems obtaining your medications?: No  Social/Family/Support Systems Patient Roles: Spouse, Parent (Has a wife, 2 daughters, 1 son.) Contact  Information: Bensyn Bornemann - wife (c) [phone] Anticipated Caregiver: wife and self Ability/Limitations of Caregiver: Wife is retired, can provide some supervision. Caregiver Availability: 24/7 Discharge Plan Discussed with Primary Caregiver: Yes Is Caregiver In Agreement with Plan?: Yes Does Caregiver/Family have Issues with Lodging/Transportation while Pt is in Rehab?: No  Goals/Additional Needs Patient/Family Goal for Rehab: PT/OT mod I goals Expected length of stay: 5-7 days Cultural Considerations: None Dietary Needs: Regular diet, thin liquids Equipment Needs: TBD Pt/Family Agrees to Admission and willing to participate: Yes Program Orientation Provided & Reviewed with Pt/Caregiver Including Roles  & Responsibilities: Yes  Decrease burden of Care through IP rehab admission: N/A  Possible need for SNF placement upon discharge: Not anticipated  Patient Condition: This patient's medical and functional status has changed since the consult dated: 03/30/15 in which the Rehabilitation Physician determined and documented that the patient's condition is appropriate for intensive rehabilitative care in an inpatient rehabilitation facility. See "History of Present Illness" (above) for medical update. Functional changes are: Currently requiring min assist to ambulate 50 ft RW. Patient's medical and functional status update has been discussed with the Rehabilitation physician and patient remains appropriate for inpatient rehabilitation. Will admit to inpatient rehab tomorrow, Saturday.  Preadmission Screen Completed By:  Retta Diones, 03/31/2015 1:47 PM ______________________________________________________________________   Discussed status with Dr. Letta Pate on 03/31/15 at 1345 and received telephone approval for admission tomorrow, Saturday.  Admission Coordinator:  Retta Diones, time1346/Date08/19/16

## 2015-03-31 NOTE — Addendum Note (Signed)
Addendum  created 03/31/15 3552 by Franne Grip, MD   Modules edited: Clinical Notes   Clinical Notes:  File: [id number]

## 2015-03-31 NOTE — Progress Notes (Signed)
Called to pull epidural. POD #2.   S: Epidural worked well first night. Started to wear off yesterday afternoon. Doing well now on alternative pain therapies. Denies back pain. Walked yesterday afternoon.  O: Back non-red, non-tender. Epidural in place.  INR 1.25  A/P: pulled epidural , tip intact, band-aid on. RN aware.

## 2015-03-31 NOTE — Progress Notes (Signed)
Physical Therapy Treatment Patient Details Name: Cody Hodge MRN: [id number] DOB: January 27, 1946 Today's Date: 03/31/2015    History of Present Illness 69 yo male s/p bil TKAs    PT Comments    Utilized both KI this session for safety and pain control,, patient is able to perform SLR with L leg. Recommend CIR   Follow Up Recommendations  CIR     Equipment Recommendations  None recommended by PT    Recommendations for Other Services       Precautions / Restrictions Precautions Precautions: Knee;Fall Required Braces or Orthoses: Knee Immobilizer - Right;Knee Immobilizer - Left Knee Immobilizer - Right: Discontinue once straight leg raise with < 10 degree lag Knee Immobilizer - Left: Discontinue once straight leg raise with < 10 degree lag Restrictions Weight Bearing Restrictions: No Other Position/Activity Restrictions: WBAT    Mobility  Bed Mobility         Supine to sit: Min assist     General bed mobility comments: assist for LEs  Transfers   Equipment used: Rolling walker (2 wheeled) Transfers: Sit to/from Stand Sit to Stand: Min assist;+2 safety/equipment         General transfer comment: Bed raised high. Assist to rise and steady.  cues on technique, how to walk legs backward to stand, then walk legs forward to sit down   Ambulation/Gait Ambulation/Gait assistance: Min assist;+2 physical assistance;+2 safety/equipment Ambulation Distance (Feet): 50 Feet Assistive device: Rolling walker (2 wheeled) Gait Pattern/deviations: Step-to pattern;Trunk flexed     General Gait Details: cues for position inside of RW, to stay back from front for balance.   Stairs            Wheelchair Mobility    Modified Rankin (Stroke Patients Only)       Balance                                    Cognition Arousal/Alertness: Awake/alert Behavior During Therapy: WFL for tasks assessed/performed Overall Cognitive Status: Within Functional  Limits for tasks assessed                      Exercises      General Comments        Pertinent Vitals/Pain Pain Assessment: 0-10 Pain Score: 4  Pain Location: bil knees while walking Pain Descriptors / Indicators: Aching;Tender Pain Intervention(s): Monitored during session;Premedicated before session;Repositioned;Ice applied    Home Living Family/patient expects to be discharged to:: Inpatient rehab               Additional Comments: has borrowed shower seat and AE kit.  Does not have DME for low commode    Prior Function Level of Independence: Independent          PT Goals (current goals can now be found in the care plan section) Acute Rehab PT Goals Patient Stated Goal: go to rehab then home Progress towards PT goals: Progressing toward goals    Frequency  7X/week    PT Plan Current plan remains appropriate    Co-evaluation PT/OT/SLP Co-Evaluation/Treatment: Yes Reason for Co-Treatment: For patient/therapist safety PT goals addressed during session: Mobility/safety with mobility OT goals addressed during session: ADL's and self-care     End of Session Equipment Utilized During Treatment: Gait belt;Right knee immobilizer Activity Tolerance: Patient tolerated treatment well Patient left: in chair;with call bell/phone within reach;with family/visitor present     Time:  5997-7414 PT Time Calculation (min) (ACUTE ONLY): 27 min  Charges:                       G Codes:      Cody Hodge 03/31/2015, 1:32 PM

## 2015-03-31 NOTE — Care Management Important Message (Signed)
Important Message  Patient Details  Name: Pleas Carneal MRN: [id number] Date of Birth: Jul 11, 1946   Medicare Important Message Given:  Yes-second notification given    Camillo Flaming 03/31/2015, 12:01 [place] Message  Patient Details  Name: Braxston Quinter MRN: [id number] Date of Birth: [birth year]   Medicare Important Message Given:  Yes-second notification given    Camillo Flaming 03/31/2015, 12:01 PM

## 2015-03-31 NOTE — Progress Notes (Addendum)
[place] for Warfarin Indication: VTE prophylaxis  No Active Allergies  Patient Measurements: Height: 5\' 10"  (177.8 cm) Weight: 194 lb (87.998 kg) IBW/kg (Calculated) : 73 Heparin Dosing Weight:   Vital Signs: Temp: 98.3 F (36.8 C) (08/19 0559) Temp Source: Oral (08/19 0559) BP: 152/67 mmHg (08/19 0559) Pulse Rate: 94 (08/19 0559)  Labs:  Recent Labs  03/30/15 0435 03/31/15 0545  HGB 10.8* 9.6*  HCT 33.3* 29.1*  PLT 259 270  LABPROT 15.1 15.8*  INR 1.17 1.25  CREATININE 0.96 0.99    Estimated Creatinine Clearance: 78.7 mL/min (by C-G formula based on Cr of 0.99).   Medical History: Past Medical History  Diagnosis Date  . Tremor   . Knee arthropathy   . OSA (obstructive sleep apnea)     mild to moderate refused CPAP  . Mixed hyperlipidemia   . Hypercholesteremia   . Melanoma   . PAF (paroxysmal atrial fibrillation)     CHADS VASC score 2 (age>65 and HTN)  . Arthritis   . ED (erectile dysfunction)   . HTN (hypertension)   . BPH (benign prostatic hyperplasia)   . Melanoma   . Coronary artery disease 09/2014    50% mid to distal LAD, 50-70% ostial first diag, 40% ostial and mid left circ.  Marland Kitchen Hemorrhoids     associated with bowel movements   Assessment: 59 yoM on xarelto for hx PAF and bilateral knee osteoarthritis now s/p bilateral TKA on 8/17 with epidural catheter in place.   --Last dose of xarelto 8/14.   --Plan is to start warfarin on POD1 8/18 for VTE prophylaxis  --Epidural likely out on POD2 8/19.   --Need to f/u plan to transition back to xarelto once epidural bleeding risk gone.     ---Baseline PT/INR and aPTT all WNL.  No issues with CBC.    Today, 03/31/2015  INR = 1.25  CBC: Hgb decreased post-op, pltc WNL  Epidural removed 0830 03/31/2015   Per Ortho, plan is warfarin x 3 weeks then resume xarelto 20mg  daily  Note patient reports periodic hemorrhoidal bleeding while on xarelto  Goal of  Therapy:  INR 2-2.5 for hemorrhoidal bleeding (occurred while on xarelto) Monitor platelets by anticoagulation protocol: Yes   Plan:   warfarin 5mg  po x 1 tonight  Enoxaparin 30mg  SQ q12h to start tonight (12h after epidural removed)  D/C once INR >=2  Daily PT/INR  Monitor for bleeding  Doreene Eland, PharmD, BCPS.   Pager: [phone] 03/31/2015, 9:06 AM

## 2015-03-31 NOTE — Progress Notes (Signed)
   Subjective: 2 Days Post-Op Procedure(s) (LRB): TOTAL KNEE BILATERAL (Bilateral) Patient reports pain as moderate.   Patient seen in rounds with Dr. Wynelle Link. Patient is well, and has had no acute complaints or problems other than some pain in the knees. He reports that he started to feel the epidural wearing off yesterday. He reports that he is feeling better this morning with the oral pain meds. No other issues overnight. No SOB or chest pain.  Plan is to go Rehab after hospital stay.  Objective: Vital signs in last 24 hours: Temp:  [98.3 F (36.8 C)-98.5 F (36.9 C)] 98.3 F (36.8 C) (08/19 0559) Pulse Rate:  [75-95] 94 (08/19 0559) Resp:  [15-16] 16 (08/19 0559) BP: (107-179)/(59-83) 152/67 mmHg (08/19 0559) SpO2:  [99 %-100 %] 99 % (08/19 0559)  Intake/Output from previous day:  Intake/Output Summary (Last 24 hours) at 03/31/15 0736 Last data filed at 03/31/15 0600  Gross per 24 hour  Intake   1370 ml  Output   1400 ml  Net    -30 ml     Labs:  Recent Labs  03/30/15 0435 03/31/15 0545  HGB 10.8* 9.6*    Recent Labs  03/30/15 0435 03/31/15 0545  WBC 14.6* 21.7*  RBC 3.91* 3.49*  HCT 33.3* 29.1*  PLT 259 270    Recent Labs  03/30/15 0435 03/31/15 0545  NA 137 137  K 3.8 3.9  CL 104 101  CO2 24 28  BUN 16 24*  CREATININE 0.96 0.99  GLUCOSE 150* 135*  CALCIUM 8.4* 8.6*    Recent Labs  03/30/15 0435 03/31/15 0545  INR 1.17 1.25    EXAM General - Patient is Alert and Oriented Extremity - Neurologically intact Intact pulses distally Dorsiflexion/Plantar flexion intact Compartment soft Dressing/Incision - clean, dry, no drainage Motor Function - intact, moving foot and toes well on exam. Able to perform SLR bilaterally  Past Medical History  Diagnosis Date  . Tremor   . Knee arthropathy   . OSA (obstructive sleep apnea)     mild to moderate refused CPAP  . Mixed hyperlipidemia   . Hypercholesteremia   . Melanoma   . PAF (paroxysmal  atrial fibrillation)     CHADS VASC score 2 (age>65 and HTN)  . Arthritis   . ED (erectile dysfunction)   . HTN (hypertension)   . BPH (benign prostatic hyperplasia)   . Melanoma   . Coronary artery disease 09/2014    50% mid to distal LAD, 50-70% ostial first diag, 40% ostial and mid left circ.  Marland Kitchen Hemorrhoids     associated with bowel movements    Assessment/Plan: 2 Days Post-Op Procedure(s) (LRB): TOTAL KNEE BILATERAL (Bilateral) Principal Problem:   OA (osteoarthritis) of knee  Estimated body mass index is 27.84 kg/(m^2) as calculated from the following:   Height as of this encounter: 5\' 10"  (1.778 m).   Weight as of this encounter: 87.998 kg (194 lb). Advance diet Up with therapy D/C IV fluids Plan for discharge tomorrow to CIR  DVT Prophylaxis - Lovenox and Coumadin Weight-Bearing as tolerated   Will DC epidural today. Start Lovenox 30 BID 12 hours after and will remove foley 6 hours after. Plan for [facility] to [facility] tomorrow. Continue PT today.   Ardeen Jourdain, PA-C Orthopaedic Surgery 03/31/2015, 7:36 AM

## 2015-04-01 ENCOUNTER — Inpatient Hospital Stay (HOSPITAL_COMMUNITY)
Admission: RE | Admit: 2015-04-01 | Discharge: 2015-04-06 | DRG: 092 | Disposition: A | Discharge status: Home Health | Payer: Medicare Other | Source: Intra-hospital | Attending: Physical Medicine & Rehabilitation | Admitting: Physical Medicine & Rehabilitation

## 2015-04-01 ENCOUNTER — Encounter (HOSPITAL_COMMUNITY): Payer: Self-pay | Admitting: Rehabilitation

## 2015-04-01 DIAGNOSIS — D62 Acute posthemorrhagic anemia: Secondary | ICD-10-CM | POA: Diagnosis present

## 2015-04-01 DIAGNOSIS — R609 Edema, unspecified: Secondary | ICD-10-CM | POA: Diagnosis not present

## 2015-04-01 DIAGNOSIS — D72829 Elevated white blood cell count, unspecified: Secondary | ICD-10-CM | POA: Diagnosis present

## 2015-04-01 DIAGNOSIS — I10 Essential (primary) hypertension: Secondary | ICD-10-CM | POA: Diagnosis present

## 2015-04-01 DIAGNOSIS — R2689 Other abnormalities of gait and mobility: Principal | ICD-10-CM | POA: Diagnosis present

## 2015-04-01 DIAGNOSIS — G4733 Obstructive sleep apnea (adult) (pediatric): Secondary | ICD-10-CM | POA: Diagnosis present

## 2015-04-01 DIAGNOSIS — K5900 Constipation, unspecified: Secondary | ICD-10-CM | POA: Diagnosis present

## 2015-04-01 DIAGNOSIS — K5901 Slow transit constipation: Secondary | ICD-10-CM | POA: Diagnosis not present

## 2015-04-01 DIAGNOSIS — Z96653 Presence of artificial knee joint, bilateral: Secondary | ICD-10-CM | POA: Diagnosis present

## 2015-04-01 DIAGNOSIS — N400 Enlarged prostate without lower urinary tract symptoms: Secondary | ICD-10-CM | POA: Diagnosis present

## 2015-04-01 DIAGNOSIS — I2510 Atherosclerotic heart disease of native coronary artery without angina pectoris: Secondary | ICD-10-CM | POA: Diagnosis present

## 2015-04-01 DIAGNOSIS — E860 Dehydration: Secondary | ICD-10-CM | POA: Diagnosis present

## 2015-04-01 DIAGNOSIS — M179 Osteoarthritis of knee, unspecified: Secondary | ICD-10-CM | POA: Diagnosis present

## 2015-04-01 DIAGNOSIS — E782 Mixed hyperlipidemia: Secondary | ICD-10-CM | POA: Diagnosis present

## 2015-04-01 DIAGNOSIS — N529 Male erectile dysfunction, unspecified: Secondary | ICD-10-CM | POA: Diagnosis present

## 2015-04-01 DIAGNOSIS — M1710 Unilateral primary osteoarthritis, unspecified knee: Secondary | ICD-10-CM | POA: Diagnosis present

## 2015-04-01 DIAGNOSIS — M170 Bilateral primary osteoarthritis of knee: Secondary | ICD-10-CM | POA: Diagnosis not present

## 2015-04-01 LAB — CBC WITH DIFFERENTIAL/PLATELET
BASOS PCT: 0 % (ref 0–1)
Basophils Absolute: 0 10*3/uL (ref 0.0–0.1)
EOS ABS: 0.1 10*3/uL (ref 0.0–0.7)
Eosinophils Relative: 0 % (ref 0–5)
HCT: 25.1 % — ABNORMAL LOW (ref 39.0–52.0)
HEMOGLOBIN: 8.3 g/dL — AB (ref 13.0–17.0)
Lymphocytes Relative: 16 % (ref 12–46)
Lymphs Abs: 1.8 10*3/uL (ref 0.7–4.0)
MCH: 27.7 pg (ref 26.0–34.0)
MCHC: 33.1 g/dL (ref 30.0–36.0)
MCV: 83.7 fL (ref 78.0–100.0)
MONO ABS: 0.9 10*3/uL (ref 0.1–1.0)
MONOS PCT: 8 % (ref 3–12)
NEUTROS PCT: 76 % (ref 43–77)
Neutro Abs: 8.5 10*3/uL — ABNORMAL HIGH (ref 1.7–7.7)
Platelets: 234 10*3/uL (ref 150–400)
RBC: 3 MIL/uL — ABNORMAL LOW (ref 4.22–5.81)
RDW: 13.7 % (ref 11.5–15.5)
WBC: 11.3 10*3/uL — ABNORMAL HIGH (ref 4.0–10.5)

## 2015-04-01 LAB — CBC
HEMATOCRIT: 25.9 % — AB (ref 39.0–52.0)
HEMOGLOBIN: 8.4 g/dL — AB (ref 13.0–17.0)
MCH: 27.2 pg (ref 26.0–34.0)
MCHC: 32.4 g/dL (ref 30.0–36.0)
MCV: 83.8 fL (ref 78.0–100.0)
Platelets: 240 10*3/uL (ref 150–400)
RBC: 3.09 MIL/uL — ABNORMAL LOW (ref 4.22–5.81)
RDW: 13.8 % (ref 11.5–15.5)
WBC: 14.6 10*3/uL — ABNORMAL HIGH (ref 4.0–10.5)

## 2015-04-01 LAB — URINALYSIS, ROUTINE W REFLEX MICROSCOPIC
Bilirubin Urine: NEGATIVE
Glucose, UA: NEGATIVE mg/dL
Hgb urine dipstick: NEGATIVE
Ketones, ur: NEGATIVE mg/dL
LEUKOCYTES UA: NEGATIVE
NITRITE: NEGATIVE
PH: 6 (ref 5.0–8.0)
Protein, ur: NEGATIVE mg/dL
SPECIFIC GRAVITY, URINE: 1.021 (ref 1.005–1.030)
Urobilinogen, UA: 0.2 mg/dL (ref 0.0–1.0)

## 2015-04-01 LAB — COMPREHENSIVE METABOLIC PANEL
ALBUMIN: 2.7 g/dL — AB (ref 3.5–5.0)
ALT: 22 U/L (ref 17–63)
ANION GAP: 6 (ref 5–15)
AST: 29 U/L (ref 15–41)
Alkaline Phosphatase: 64 U/L (ref 38–126)
BUN: 21 mg/dL — ABNORMAL HIGH (ref 6–20)
CO2: 31 mmol/L (ref 22–32)
Calcium: 8.6 mg/dL — ABNORMAL LOW (ref 8.9–10.3)
Chloride: 100 mmol/L — ABNORMAL LOW (ref 101–111)
Creatinine, Ser: 0.93 mg/dL (ref 0.61–1.24)
GFR calc Af Amer: >60 mL/min (ref >60)
GFR calc non Af Amer: >60 mL/min (ref >60)
GLUCOSE: 126 mg/dL — AB (ref 65–99)
POTASSIUM: 3.9 mmol/L (ref 3.5–5.1)
SODIUM: 137 mmol/L (ref 135–145)
Total Bilirubin: 0.5 mg/dL (ref 0.3–1.2)
Total Protein: 5.4 g/dL — ABNORMAL LOW (ref 6.5–8.1)

## 2015-04-01 LAB — PROTIME-INR
INR: 1.14 (ref 0.00–1.49)
Prothrombin Time: 14.8 seconds (ref 11.6–15.2)

## 2015-04-01 MED ORDER — DILTIAZEM HCL ER COATED BEADS 120 MG PO CP24
120.0000 mg | ORAL_CAPSULE | Freq: Every day | ORAL | Status: DC
  Administered 2015-04-01 – 2015-04-06 (×5): 120 mg via ORAL
  Filled 2015-04-01 (×8): qty 1

## 2015-04-01 MED ORDER — TRAMADOL HCL 50 MG PO TABS
50.0000 mg | ORAL_TABLET | Freq: Four times a day (QID) | ORAL | Status: DC | PRN
  Administered 2015-04-05 (×2): 100 mg via ORAL
  Filled 2015-04-01 (×2): qty 2

## 2015-04-01 MED ORDER — WARFARIN SODIUM 5 MG PO TABS
5.0000 mg | ORAL_TABLET | Freq: Once | ORAL | Status: DC
  Filled 2015-04-01: qty 1

## 2015-04-01 MED ORDER — ENOXAPARIN SODIUM 30 MG/0.3ML ~~LOC~~ SOLN
30.0000 mg | Freq: Two times a day (BID) | SUBCUTANEOUS | Status: DC
  Administered 2015-04-01 – 2015-04-04 (×7): 30 mg via SUBCUTANEOUS
  Filled 2015-04-01 (×7): qty 0.3

## 2015-04-01 MED ORDER — ONDANSETRON HCL 4 MG/2ML IJ SOLN: 4.0000 mg | Freq: Four times a day (QID) | INTRAMUSCULAR | Status: DC | PRN

## 2015-04-01 MED ORDER — SODIUM CHLORIDE 0.9 % IJ SOLN: 3.0000 mL | INTRAMUSCULAR | Status: DC | PRN

## 2015-04-01 MED ORDER — FLEET ENEMA 7-19 GM/118ML RE ENEM: 1.0000 | ENEMA | Freq: Once | RECTAL | Status: DC | PRN

## 2015-04-01 MED ORDER — TRIAMCINOLONE ACETONIDE 55 MCG/ACT NA AERO
1.0000 | INHALATION_SPRAY | Freq: Every day | NASAL | Status: DC
  Administered 2015-04-03: 1 via NASAL
  Filled 2015-04-01: qty 10.8

## 2015-04-01 MED ORDER — PHENOL 1.4 % MT LIQD: 1.0000 | OROMUCOSAL | Status: DC | PRN

## 2015-04-01 MED ORDER — ATORVASTATIN CALCIUM 20 MG PO TABS
20.0000 mg | ORAL_TABLET | Freq: Every day | ORAL | Status: DC
  Administered 2015-04-01 – 2015-04-06 (×6): 20 mg via ORAL
  Filled 2015-04-01 (×6): qty 1

## 2015-04-01 MED ORDER — DOCUSATE SODIUM 100 MG PO CAPS
100.0000 mg | ORAL_CAPSULE | Freq: Two times a day (BID) | ORAL | Status: DC
  Administered 2015-04-01: 100 mg via ORAL
  Filled 2015-04-01 (×2): qty 1

## 2015-04-01 MED ORDER — DIPHENHYDRAMINE HCL 12.5 MG/5ML PO ELIX
12.5000 mg | ORAL_SOLUTION | ORAL | Status: DC | PRN
Start: 2015-04-01 — End: 2015-04-06

## 2015-04-01 MED ORDER — ONDANSETRON HCL 4 MG PO TABS: 4.0000 mg | ORAL_TABLET | Freq: Four times a day (QID) | ORAL | Status: DC | PRN

## 2015-04-01 MED ORDER — WARFARIN SODIUM 5 MG PO TABS
5.0000 mg | ORAL_TABLET | Freq: Once | ORAL | Status: AC
  Administered 2015-04-01: 5 mg via ORAL
  Filled 2015-04-01: qty 1

## 2015-04-01 MED ORDER — METHOCARBAMOL 500 MG PO TABS
500.0000 mg | ORAL_TABLET | Freq: Four times a day (QID) | ORAL | Status: DC | PRN
Start: 2015-04-01 — End: 2015-04-02

## 2015-04-01 MED ORDER — BISACODYL 10 MG RE SUPP
10.0000 mg | Freq: Every day | RECTAL | Status: DC | PRN
  Administered 2015-04-02: 10 mg via RECTAL
  Filled 2015-04-01: qty 1

## 2015-04-01 MED ORDER — WARFARIN - PHARMACIST DOSING INPATIENT: Freq: Every day | Status: DC

## 2015-04-01 MED ORDER — ACETAMINOPHEN 325 MG PO TABS
325.0000 mg | ORAL_TABLET | ORAL | Status: DC | PRN
  Administered 2015-04-01 – 2015-04-04 (×5): 650 mg via ORAL
  Filled 2015-04-01 (×6): qty 2

## 2015-04-01 MED ORDER — TRAZODONE HCL 50 MG PO TABS
25.0000 mg | ORAL_TABLET | Freq: Every evening | ORAL | Status: DC | PRN
Start: 2015-04-01 — End: 2015-04-06

## 2015-04-01 MED ORDER — POLYETHYLENE GLYCOL 3350 17 G PO PACK
17.0000 g | PACK | Freq: Every day | ORAL | Status: DC | PRN
  Administered 2015-04-01 – 2015-04-02 (×2): 17 g via ORAL
  Filled 2015-04-01 (×3): qty 1

## 2015-04-01 MED ORDER — TAMSULOSIN HCL 0.4 MG PO CAPS
0.4000 mg | ORAL_CAPSULE | Freq: Every day | ORAL | Status: DC
  Administered 2015-04-01 – 2015-04-05 (×5): 0.4 mg via ORAL
  Filled 2015-04-01 (×5): qty 1

## 2015-04-01 MED ORDER — WARFARIN VIDEO: Freq: Once | Status: DC

## 2015-04-01 MED ORDER — FINASTERIDE 5 MG PO TABS
5.0000 mg | ORAL_TABLET | Freq: Every day | ORAL | Status: DC
  Administered 2015-04-01 – 2015-04-06 (×6): 5 mg via ORAL
  Filled 2015-04-01 (×6): qty 1

## 2015-04-01 MED ORDER — SENNOSIDES-DOCUSATE SODIUM 8.6-50 MG PO TABS
2.0000 | ORAL_TABLET | Freq: Every evening | ORAL | Status: DC | PRN
  Filled 2015-04-01: qty 2

## 2015-04-01 MED ORDER — MENTHOL 3 MG MT LOZG: 1.0000 | LOZENGE | OROMUCOSAL | Status: DC | PRN

## 2015-04-01 MED ORDER — FLECAINIDE ACETATE 100 MG PO TABS
100.0000 mg | ORAL_TABLET | Freq: Two times a day (BID) | ORAL | Status: DC
  Administered 2015-04-01 – 2015-04-06 (×10): 100 mg via ORAL
  Filled 2015-04-01 (×13): qty 1

## 2015-04-01 MED ORDER — ALUM & MAG HYDROXIDE-SIMETH 200-200-20 MG/5ML PO SUSP: 30.0000 mL | ORAL | Status: DC | PRN

## 2015-04-01 MED ORDER — NALOXONE HCL 0.4 MG/ML IJ SOLN: 0.4000 mg | INTRAMUSCULAR | Status: DC | PRN

## 2015-04-01 MED ORDER — GUAIFENESIN-DM 100-10 MG/5ML PO SYRP: 5.0000 mL | ORAL_SOLUTION | Freq: Four times a day (QID) | ORAL | Status: DC | PRN

## 2015-04-01 MED ORDER — OXYCODONE HCL 5 MG PO TABS
10.0000 mg | ORAL_TABLET | ORAL | Status: DC | PRN
  Administered 2015-04-02 (×2): 10 mg via ORAL
  Filled 2015-04-01 (×2): qty 2

## 2015-04-01 NOTE — Progress Notes (Signed)
Patient arrived in the unit at 2:00 pm accompanied by two EMS personnel via ambulance. Alert and oriented X 4. Able to make his needs known. Patient's stable.Vital signs taken and recorded.  Denies any pain. Orientation given to the unit. Patient verbalizes understanding. S/P bilateral knee replacement Dressing intact to bilateral knee with minimal old drainage to the left . Able to move all four upper and lower extremities.  No signs and symptoms of respiratory distress noted.Will continue to monitor.

## 2015-04-01 NOTE — Progress Notes (Addendum)
     Subjective: 3 Days Post-Op Procedure(s) (LRB): TOTAL KNEE BILATERAL (Bilateral)   Patient reports pain as mild, pain controlled. No events throughout the night. Ready to be discharged to rehab.  Objective:   VITALS:   Filed Vitals:   04/01/15 0600  BP: 125/69  Pulse: 98  Temp: 99.1 F (37.3 C)  Resp: 16    Dorsiflexion/Plantar flexion intact Incision: dressing C/D/I No cellulitis present Compartment soft  LABS  Recent Labs  03/30/15 0435 03/31/15 0545 04/01/15 0445  HGB 10.8* 9.6* 8.4*  HCT 33.3* 29.1* 25.9*  WBC 14.6* 21.7* 14.6*  PLT 259 270 240     Recent Labs  03/30/15 0435 03/31/15 0545  NA 137 137  K 3.8 3.9  BUN 16 24*  CREATININE 0.96 0.99  GLUCOSE 150* 135*     Assessment/Plan: 3 Days Post-Op Procedure(s) (LRB): TOTAL KNEE BILATERAL (Bilateral) Dressings changed with 4x4 gauze and tape Up with therapy Discharge to [place]. Cody Hodge   PAC  04/01/2015, 9:07 AM

## 2015-04-01 NOTE — Care Management (Signed)
CM contacted by Rodena Piety, RN stating patient does not have a PCP. CM provided the telephone number for [facility]. RN will provide the information to the patient. Presenter, broadcasting BSN CCM

## 2015-04-01 NOTE — Progress Notes (Signed)
Physical Therapy Treatment Patient Details Name: Cody Hodge MRN: [id number] DOB: 1946-05-28 Today's Date: 04/01/2015    History of Present Illness 69 yo male s/p bil TKAs    PT Comments    Progress is exceptional. Ready for CIR  Follow Up Recommendations  CIR     Equipment Recommendations  None recommended by PT    Recommendations for Other Services       Precautions / Restrictions Precautions Precautions: Knee;Fall Restrictions Weight Bearing Restrictions: No    Mobility  Bed Mobility   Bed Mobility: Supine to Sit     Supine to sit: Supervision     General bed mobility comments: manages legs and trunk  Transfers Overall transfer level: Needs assistance Equipment used: Rolling walker (2 wheeled) Transfers: Sit to/from Stand Sit to Stand: Mod assist;From elevated surface         General transfer comment: cues for safety, able to get knees flexed a little to stand,  Ambulation/Gait Ambulation/Gait assistance: Min assist Ambulation Distance (Feet): 280 Feet Assistive device: Rolling walker (2 wheeled) Gait Pattern/deviations: Step-to pattern;Step-through pattern;Trunk flexed     General Gait Details: multimodal cues fior position inside of Rw, for posture and step length.no KI on either leg.   Stairs            Wheelchair Mobility    Modified Rankin (Stroke Patients Only)       Balance                                    Cognition Arousal/Alertness: Awake/alert                          Exercises Total Joint Exercises Ankle Circles/Pumps: AROM;Both;10 reps;Supine Quad Sets: 10 reps;AROM;Supine;Right Short Arc Quad: AROM;15 reps;Supine;Left Heel Slides: AAROM;10 reps;Supine;Left Hip ABduction/ADduction: AROM;10 reps;Supine;Left Straight Leg Raises: Left;10 reps;Supine Long Arc Quad: Left;10 reps;Supine Knee Flexion: AROM;Left;10 reps;Supine    General Comments        Pertinent Vitals/Pain Pain  Score: 2  Pain Location: 2 each knee Pain Descriptors / Indicators: Tender Pain Intervention(s): Monitored during session    Home Living                      Prior Function            PT Goals (current goals can now be found in the care plan section) Progress towards PT goals: Progressing toward goals    Frequency  7X/week    PT Plan Current plan remains appropriate    Co-evaluation             End of Session   Activity Tolerance: Patient tolerated treatment well Patient left: with call bell/phone within reach (in bathroom)     Time: 2035-5974 PT Time Calculation (min) (ACUTE ONLY): 29 min  Charges:  $Gait Training: 8-22 mins $Therapeutic Exercise: 8-22 mins                    G Codes:      Claretha Cooper 04/01/2015, 1:04 PM Tresa Endo PT [phone]

## 2015-04-01 NOTE — Progress Notes (Signed)
Physical Therapy Treatment Patient Details Name: Cody Hodge MRN: [id number] DOB: 10-18-1945 Today's Date: 04/01/2015    History of Present Illness 69 yo male s/p bil TKAs    PT Comments    Progressing well. Wants to eat then walk.  Follow Up Recommendations  CIR     Equipment Recommendations  None recommended by PT    Recommendations for Other Services       Precautions / Restrictions Precautions Precautions: Knee;Fall Restrictions Weight Bearing Restrictions: No    Mobility  Bed Mobility                  Transfers                    Ambulation/Gait                 Stairs            Wheelchair Mobility    Modified Rankin (Stroke Patients Only)       Balance                                    Cognition Arousal/Alertness: Awake/alert                          Exercises Total Joint Exercises Ankle Circles/Pumps: AROM;Both;10 reps;Supine Quad Sets: 10 reps;AROM;Supine;Right Short Arc Quad: AROM;15 reps;Supine;Right Heel Slides: AAROM;10 reps;Right;Supine Hip ABduction/ADduction: AROM;10 reps;Supine;Right Straight Leg Raises: AROM;15 reps;Supine;Right R 5-90 knee flexion   General Comments        Pertinent Vitals/Pain Pain Score: 2  Pain Location: each knee Pain Descriptors / Indicators: Tender;Tightness Pain Intervention(s): Monitored during session;Premedicated before session;Ice applied    Home Living                      Prior Function            PT Goals (current goals can now be found in the care plan section) Progress towards PT goals: Progressing toward goals    Frequency  7X/week    PT Plan Current plan remains appropriate    Co-evaluation             End of Session   Activity Tolerance: Patient tolerated treatment well Patient left: in bed;with call bell/phone within reach     Time: 0857-0920 PT Time Calculation (min) (ACUTE ONLY): 23  min  Charges:  $Gait Training: 23-37 mins                    G Codes:      Claretha Cooper 04/01/2015, 12:51 PM

## 2015-04-01 NOTE — Progress Notes (Signed)
ANTICOAGULATION CONSULT NOTE - Follow up  Pharmacy Consult for warfarin Indication: VTE prophylaxis  No Active Allergies  Patient Measurements: Height: 5\' 10"  (177.8 cm) Weight: 194 lb (87.998 kg) IBW/kg (Calculated) : 73 Heparin Dosing Weight:   Vital Signs: Temp: 99.1 F (37.3 C) (08/20 0600) Temp Source: Oral (08/20 0600) BP: 125/69 mmHg (08/20 0600) Pulse Rate: 98 (08/20 0600)  Labs:  Recent Labs  03/30/15 0435 03/31/15 0545 04/01/15 0445  HGB 10.8* 9.6* 8.4*  HCT 33.3* 29.1* 25.9*  PLT 259 270 240  LABPROT 15.1 15.8* 14.8  INR 1.17 1.25 1.14  CREATININE 0.96 0.99  --     Estimated Creatinine Clearance: 78.7 mL/min (by C-G formula based on Cr of 0.99).   Medical History: Past Medical History  Diagnosis Date  . Tremor   . Knee arthropathy   . OSA (obstructive sleep apnea)     mild to moderate refused CPAP  . Mixed hyperlipidemia   . Hypercholesteremia   . Melanoma   . PAF (paroxysmal atrial fibrillation)     CHADS VASC score 2 (age>65 and HTN)  . Arthritis   . ED (erectile dysfunction)   . HTN (hypertension)   . BPH (benign prostatic hyperplasia)   . Melanoma   . Coronary artery disease 09/2014    50% mid to distal LAD, 50-70% ostial first diag, 40% ostial and mid left circ.  Marland Kitchen Hemorrhoids     associated with bowel movements   Assessment: 29 yoM on xarelto for hx PAF and bilateral knee osteoarthritis now s/p bilateral TKA on 8/17 with epidural catheter in place.   --Last dose of Xarelto 8/14.   --Plan is to start warfarin on POD1 8/18 for VTE prophylaxis   Today, 04/01/2015  INR remains subtherapeutic after warfarin 2.5mg  and 5mg .   CBC: Hgb decreased post-op, pltc WNL  Epidural removed 03/31/15 at 0830   Per Ortho, plan is warfarin x 3 weeks then resume Xarelto 20mg  daily  Note patient reports periodic hemorrhoidal bleeding while on Xarelto  Goal of Therapy:  INR 2-2.5 for hemorrhoidal bleeding (occurred while on xarelto) Monitor  platelets by anticoagulation protocol: Yes   Plan:   Repeat warfarin 5mg  po x 1 tonight  Enoxaparin 30mg  SQ q12h until INR >=2  Daily PT/INR  Monitor for bleeding  Romeo Rabon, PharmD, pager [phone]. 04/01/2015,10:40 AM.

## 2015-04-01 NOTE — Progress Notes (Signed)
Pt d/c to CIR room 61M-13. Report given to [place], Therapist, sports. IV removed and pain assessed prior to d/c. [facility] notified for transport.

## 2015-04-01 NOTE — Progress Notes (Signed)
Orthopedic Tech Progress Note Patient Details:  Cody Hodge [birth year] [id number]  CPM Left Knee CPM Left Knee: On Left Knee Flexion (Degrees): 60 Left Knee Extension (Degrees): 0 CPM Right Knee CPM Right Knee: On Right Knee Flexion (Degrees): 60 Right Knee Extension (Degrees): 0   Cody Hodge 04/01/2015, 3:25 PM

## 2015-04-02 ENCOUNTER — Inpatient Hospital Stay (HOSPITAL_COMMUNITY): Payer: Medicare Other | Admitting: Physical Therapy

## 2015-04-02 ENCOUNTER — Inpatient Hospital Stay (HOSPITAL_COMMUNITY): Payer: Medicare Other | Admitting: Occupational Therapy

## 2015-04-02 ENCOUNTER — Encounter (HOSPITAL_COMMUNITY): Payer: No Typology Code available for payment source

## 2015-04-02 ENCOUNTER — Inpatient Hospital Stay (HOSPITAL_COMMUNITY): Payer: Medicare Other

## 2015-04-02 DIAGNOSIS — Z96653 Presence of artificial knee joint, bilateral: Secondary | ICD-10-CM

## 2015-04-02 DIAGNOSIS — K5901 Slow transit constipation: Secondary | ICD-10-CM

## 2015-04-02 DIAGNOSIS — R609 Edema, unspecified: Secondary | ICD-10-CM

## 2015-04-02 DIAGNOSIS — M170 Bilateral primary osteoarthritis of knee: Secondary | ICD-10-CM

## 2015-04-02 LAB — PROTIME-INR
INR: 1.83 — ABNORMAL HIGH (ref 0.00–1.49)
Prothrombin Time: 21.1 seconds — ABNORMAL HIGH (ref 11.6–15.2)

## 2015-04-02 MED ORDER — METHOCARBAMOL 500 MG PO TABS
500.0000 mg | ORAL_TABLET | Freq: Every evening | ORAL | Status: DC | PRN
  Administered 2015-04-02 – 2015-04-05 (×4): 500 mg via ORAL
  Filled 2015-04-02 (×4): qty 1

## 2015-04-02 MED ORDER — OXYCODONE HCL 5 MG PO TABS
10.0000 mg | ORAL_TABLET | ORAL | Status: DC | PRN
  Administered 2015-04-02 – 2015-04-03 (×5): 10 mg via ORAL
  Filled 2015-04-02 (×5): qty 2

## 2015-04-02 MED ORDER — WARFARIN SODIUM 5 MG PO TABS
2.5000 mg | ORAL_TABLET | Freq: Once | ORAL | Status: AC
  Administered 2015-04-02: 2.5 mg via ORAL
  Filled 2015-04-02: qty 1

## 2015-04-02 MED ORDER — SORBITOL 70 % SOLN
30.0000 mL | Freq: Every day | Status: DC | PRN
  Administered 2015-04-02: 30 mL via ORAL
  Filled 2015-04-02: qty 30

## 2015-04-02 MED ORDER — SENNOSIDES-DOCUSATE SODIUM 8.6-50 MG PO TABS
2.0000 | ORAL_TABLET | Freq: Two times a day (BID) | ORAL | Status: DC
  Administered 2015-04-02 – 2015-04-05 (×5): 2 via ORAL
  Filled 2015-04-02 (×10): qty 2

## 2015-04-02 NOTE — Progress Notes (Signed)
ANTICOAGULATION CONSULT NOTE - Follow up  Pharmacy Consult for warfarin Indication: VTE prophylaxis  Labs:  Recent Labs  03/31/15 0545 04/01/15 0445 04/01/15 1645 04/02/15 0543  HGB 9.6* 8.4* 8.3*  --   HCT 29.1* 25.9* 25.1*  --   PLT 270 240 234  --   LABPROT 15.8* 14.8  --  21.1*  INR 1.25 1.14  --  1.83*  CREATININE 0.99  --  0.93  --     Estimated Creatinine Clearance: 85 mL/min (by C-G formula based on Cr of 0.93).  Assessment: 1 yoM on xarelto for hx PAF and bilateral knee osteoarthritis now s/p bilateral TKA on 8/17 with epidural catheter in place.   --Last dose of Xarelto 8/14.    Today, 04/02/2015  INR remains subtherapeutic at 1.8 but now trending up quickly after warfarin 2.5mg ,5mg , 5mg .   CBC: Hgb decreased post-op, pltc WNL  Epidural removed 03/31/15 at   Per Ortho, plan is warfarin x 3 weeks then resume Xarelto 20mg  daily  Note patient reports periodic hemorrhoidal bleeding while on Xarelto  Goal of Therapy:  INR 2-2.5 for hemorrhoidal bleeding (occurred while on xarelto) Monitor platelets by anticoagulation protocol: Yes   Plan:   warfarin 2.5mg  po x 1 tonight  Enoxaparin 30mg  SQ q12h until INR >=2  Daily PT/INR  Monitor for bleeding  Erin Hearing PharmD., BCPS Clinical Pharmacist Pager [phone] 04/02/2015 11:03 AM

## 2015-04-02 NOTE — Progress Notes (Signed)
Orthopedic Tech Progress Note Patient Details:  Da Authement 27-Jan-1946 [phone] On cpm at 3:30 pm Patient ID: Karry Causer, male   DOB: 23-Dec-1945, 69 y.o.   MRN: [id number]   [name] 04/02/2015, 3:34 PM

## 2015-04-02 NOTE — Evaluation (Signed)
Occupational Therapy Assessment and Plan  Patient Details  Name: Cody Hodge MRN: [id number] Date of Birth: 1945-09-28  OT Diagnosis: acute pain and pain in joint Rehab Potential: Rehab Potential (ACUTE ONLY): Good ELOS: 7 days   Today's Date: 04/02/2015 OT Individual Time:  -        Problem List:  Patient Active Problem List   Diagnosis Date Noted  . OA (osteoarthritis) of knee 03/29/2015  . Abnormal myocardial perfusion study 10/06/2014  . Dyspnea on exertion 09/26/2014  . Family history of early CAD 09/26/2014  . HTN (hypertension)   . Mixed hyperlipidemia   . OSA (obstructive sleep apnea)   . PAF (paroxysmal atrial fibrillation)     Past Medical History:  Past Medical History  Diagnosis Date  . Tremor   . Knee arthropathy   . OSA (obstructive sleep apnea)     mild to moderate refused CPAP  . Mixed hyperlipidemia   . Hypercholesteremia   . Melanoma   . PAF (paroxysmal atrial fibrillation)     CHADS VASC score 2 (age>65 and HTN)  . Arthritis   . ED (erectile dysfunction)   . HTN (hypertension)   . BPH (benign prostatic hyperplasia)   . Melanoma   . Coronary artery disease 09/2014    50% mid to distal LAD, 50-70% ostial first diag, 40% ostial and mid left circ.  Marland Kitchen Hemorrhoids     associated with bowel movements   Past Surgical History:  Past Surgical History  Procedure Laterality Date  . Left and right arthroscopy    . Lasik    . Hydrocele excision / repair    . Left forearm melanoma    . Left heart catheterization with coronary angiogram N/A 10/06/2014    Procedure: LEFT HEART CATHETERIZATION WITH CORONARY ANGIOGRAM;  Surgeon: Sinclair Grooms, MD;  Location: [facility] CATH LAB;  Service: Cardiovascular;  Laterality: N/A;  . Cardiac catheterization      2'16- only ever heart cath  . Total knee arthroplasty Bilateral 03/29/2015    Procedure: TOTAL KNEE BILATERAL;  Surgeon: Gaynelle Arabian, MD;  Location: [facility] ORS;  Service: Orthopedics;  Laterality: Bilateral;   spinal anes. placed in OR    Assessment & Plan Clinical Impression: Cody Hodge is a 69 y.o. male with history of HTN, CAD, OSA, bilaterl knee pain due to endstage OA. Patient elected to undergo B-TKR on 03/29/15 by Dr. Wynelle Link. Post op WBAT with CPM used for PROM. Post op with ABLA and leucocytosis with WBC up to 21.7. PT/OT ongoing and CIR recommended for follow up therapy.    Patient transferred to CIR on 04/01/2015 .    Patient currently requires min with basic self-care skills secondary to muscle weakness and muscle joint tightness.  Prior to hospitalization, patient could complete BADL and IADL with independent .  Patient will benefit from skilled intervention to increase independence with basic self-care skills and increase level of independence with iADL prior to discharge home with care partner.  Anticipate patient will require intermittent supervision and no further OT follow recommended.  OT - End of Session Activity Tolerance: Tolerates 30+ min activity with multiple rests Endurance Deficit: Yes OT Assessment Rehab Potential (ACUTE ONLY): Good Barriers to Discharge:  (sit to stand form shower chair or toilet with out using UE) OT Patient demonstrates impairments in the following area(s): Balance;Endurance;Motor;Pain;Safety OT Basic ADL's Functional Problem(s): Eating;Grooming;Bathing;Dressing;Toileting OT Advanced ADL's Functional Problem(s): Simple Meal Preparation OT Transfers Functional Problem(s): Toilet;Tub/Shower OT Additional Impairment(s): None OT Plan OT  Intensity: Minimum of 1-2 x/day, 45 to 90 minutes OT Frequency: 5 out of 7 days OT Duration/Estimated Length of Stay: 7 days OT Treatment/Interventions: Medical illustrator training;Community reintegration;Discharge planning;DME/adaptive equipment instruction;Functional mobility training;Pain management;Patient/family education;Self Care/advanced ADL retraining;Splinting/orthotics;Therapeutic Activities;Therapeutic  Exercise;UE/LE Strength taining/ROM OT Self Feeding Anticipated Outcome(s): independent OT Basic Self-Care Anticipated Outcome(s): mod I OT Toileting Anticipated Outcome(s): Mod I OT Bathroom Transfers Anticipated Outcome(s): Mod I OT Recommendation Patient destination: Home Follow Up Recommendations: None Equipment Recommended: None recommended by OT (Has borrowed 3n1 and shower seat )   Skilled Therapeutic Intervention   OT Evaluation Precautions/Restrictions  Precautions Precautions: Fall;Knee Required Braces or Orthoses:  (KI Discharged) Knee Immobilizer - Right: Discontinue once straight leg raise with < 10 degree lag Knee Immobilizer - Left: Discontinue once straight leg raise with < 10 degree lag Restrictions Weight Bearing Restrictions: Yes RLE Weight Bearing: Weight bearing as tolerated LLE Weight Bearing: Weight bearing as tolerated Other Position/Activity Restrictions: WBAT General   Vital Signs  Pain Pain Assessment Pain Assessment: 0-10 Pain Score: 2  Pain Type: Surgical pain Pain Location: Knee Pain Orientation: Right;Left Pain Descriptors / Indicators: Aching Pain Onset: On-going Pain Intervention(s): Medication (See eMAR) Home Living/Prior Functioning Home Living Family/patient expects to be discharged to:: Private residence Living Arrangements: Spouse/significant other Available Help at Discharge: Family, Available PRN/intermittently Type of Home: Other(Comment) Home Access: Elevator Home Layout: One level Bathroom Shower/Tub: Walk-in shower, Door (2 inch lip) Bathroom Toilet: Standard Bathroom Accessibility: Yes Additional Comments: has borrowed shower seat and AE kit.  Does not have DME for low commode  Lives With: Spouse IADL History Homemaking Responsibilities: Yes Meal Prep Responsibility: Primary Laundry Responsibility: Secondary Cleaning Responsibility: Secondary Bill Paying/Finance Responsibility: Primary Shopping Responsibility:  Secondary Child Care Responsibility: No Homemaking Comments:  (pt cooks but makes salads alot or eats out) Current License: Yes Mode of Transportation: Car Occupation: Full time employment Type of Occupation: Chief Executive Officer Leisure and Hobbies: tennis Prior Function Level of Independence: Independent with basic ADLs, Independent with homemaking with ambulation, Independent with gait, Independent with transfers  Able to Take Stairs?: Yes Driving: Yes Vocation: Full time employment Vocation Requirements: works at desk, main entrance to office 4 stairs with B rails but only reach one to enter, back entrance no stairs, building also has a ramp to enter Leisure: Hobbies-yes (Comment) Comments: very active, plays tennis ADL   Vision/Perception  Vision- History Baseline Vision/History: No visual deficits Patient Visual Report: No change from baseline Vision- Assessment Vision Assessment?: No apparent visual deficits Perception Perception: Within Functional Limits Praxis Praxis: Intact  Cognition Overall Cognitive Status: Within Functional Limits for tasks assessed Arousal/Alertness: Awake/alert Orientation Level: Person;Place;Situation Person: Oriented Place: Oriented Situation: Oriented Year: 2016 Month: August Day of Week: Correct Memory: Impaired Memory Impairment: Decreased recall of new information Immediate Memory Recall: Blue;Bed;Sock Memory Recall: Blue;Bed;Sock Memory Recall Sock: With Cue Memory Recall Blue: Without Cue Memory Recall Bed: Without Cue Attention: Selective Selective Attention: Appears intact Awareness: Appears intact Problem Solving: Appears intact Safety/Judgment: Appears intact Sensation Sensation Light Touch: Appears Intact Proprioception: Appears Intact Coordination Gross Motor Movements are Fluid and Coordinated: Yes Fine Motor Movements are Fluid and Coordinated: Yes Motor  Motor Motor: Within Functional Limits Mobility     Trunk/Postural  Assessment  Cervical Assessment Cervical Assessment: Within Functional Limits Thoracic Assessment Thoracic Assessment: Within Functional Limits Lumbar Assessment Lumbar Assessment: Within Functional Limits Postural Control Postural Control: Within Functional Limits  Balance Balance Balance Assessed: Yes Static Sitting Balance Static Sitting - Balance Support: Feet supported Static  Sitting - Level of Assistance: 5: Stand by assistance Static Standing Balance Static Standing - Balance Support: Bilateral upper extremity supported;During functional activity Static Standing - Level of Assistance: 5: Stand by assistance Dynamic Standing Balance Dynamic Standing - Balance Support: Bilateral upper extremity supported;During functional activity Dynamic Standing - Level of Assistance: 4: Min assist Dynamic Standing - Balance Activities: Reaching across midline;Reaching for objects Extremity/Trunk Assessment RUE Assessment RUE Assessment: Within Functional Limits LUE Assessment LUE Assessment: Within Functional Limits  Function:   Eating Eating               Grooming Oral Care,Brush Teeth, Clean Dentures Activity:             Wash, Rinse, Dry Face Activity   Assist Level: Supervision or verbal cues      Wash, Rinse, Dry Hands Activity          Brush, Comb Hair Activity        Shave Activity          Apply Makeup Activity                                                             Bathing Bathing position   Position: Shower  Bathing parts Body parts bathed by patient: Right arm;Left arm;Chest;Abdomen;Front perineal area;Buttocks;Left upper leg;Right upper leg;Right lower leg;Left lower leg;Back Body parts bathed by helper: Right arm;Left arm;Chest;Abdomen;Front perineal area;Right upper leg;Left upper leg;Buttocks  Bathing assist         Upper Body Dressing/Undressing Upper body dressing   What is the patient wearing?: Pull over shirt/dress     Pull  over shirt/dress - Perfomed by patient: Thread/unthread right sleeve;Thread/unthread left sleeve;Put head through opening;Pull shirt over trunk          Upper body assist Assist Level: Set up   Set up : To obtain clothing/put away   Lower Body Dressing/Undressing Lower body dressing   What is the patient wearing?: Pants;Socks     Pants- Performed by patient: Thread/unthread right pants leg;Thread/unthread left pants leg;Pull pants up/down         Socks - Performed by helper: Don/doff right sock;Don/doff left sock              Lower body assist Assist Level: Touching or steadying assistance (Pt > 75%)       Toileting Toileting Toileting activity did not occur: Refused        Toileting assist     Bed Mobility Roll left and right activity   Assist level: No help, No cues, assistive device, takes more than a reasonable amount of time  Sit to lying activity   Assist level: Supervision or verbal cues  Lying to sitting activity   Assist level: Supervision or verbal cues  Mobility details Bed mobility details: Visual cues/gestures for precautions/safety;Verbal cues for techniques   Transfers Sit to stand transfer   Sit to stand assist level: Touching or steadying assistance (Pt > 75%/lift 1 leg) Sit to stand assistive device: Walker  Chair/bed transfer   Chair/bed transfer method: Stand pivot;Ambulatory Chair/bed transfer assist level: Touching or steadying assistance (Pt > 75%) Chair/bed transfer assistive device: Walker   Chair/bed transfer details: Verbal cues for sequencing;Verbal cues for precautions/safety  Toilet transfer   Toilet transfer assistive device: Grab bar  Assist level to toilet: Supervision or verbal cues Assist level from toilet: Supervision or verbal cues  Tub/shower transfer   Tub/shower assistive device: Shower chair;Grab bars;Walk in shower;Walker   Assist level into tub: Touching or steadying assistance (Pt > 75%/lift 1  leg) Assist level out of tub: Touching or steadying assistance (Pt > 75%)   Cognition Comprehension Comprehension assist level: Understands complex 90% of the time/cues 10% of the time  Expression Expression assist level: Expresses complex 90% of the time/cues < 10% of the time  Social Interaction Social Interaction assist level: Interacts appropriately with others - No medications needed.  Problem Solving Problem solving assist level: Solves complex 90% of the time/cues < 10% of the time  Memory Memory assist level: More than reasonable amount of time    Refer to Care Plan for Long Term Goals  Recommendations for other services: None  Discharge Criteria: Patient will be discharged from OT if patient refuses treatment 3 consecutive times without medical reason, if treatment goals not met, if there is a change in medical status, if patient makes no progress towards goals or if patient is discharged from hospital.  The above assessment, treatment plan, treatment alternatives and goals were discussed and mutually agreed upon: by patient  Lisa Roca 04/02/2015, 12:42 PM

## 2015-04-02 NOTE — Progress Notes (Signed)
Physical Therapy Session Note  Patient Details  Name: Cody Hodge MRN: [id number] Date of Birth: 08/29/1945  Today's Date: 04/02/2015 PT Individual Time: 1300-1330 PT Individual Time Calculation (min): 30 min   Short Term Goals: Week 1:  PT Short Term Goal 1 (Week 1): STGs equal LTGs  Skilled Therapeutic Interventions/Progress Updates:  Pt was seen bedside in the pm with wife at bedside. Pt ambulated with c/s to min guard and verbal cues for technique and posture. Performed car transfers with min guard and verbal cues. Pt educated on transfer techniques. Pt returned to room and left sitting up in bed with call bell within reach.   Therapy Documentation Precautions:  Precautions Precautions: Fall, Knee Required Braces or Orthoses:  (KI Discharged) Restrictions Weight Bearing Restrictions: Yes RLE Weight Bearing: Weight bearing as tolerated LLE Weight Bearing: Weight bearing as tolerated Other Position/Activity Restrictions: WBAT General:   Pain: Pt c/o pain R knee 2/10 and L knee 3/10.   Function:   Bed Mobility    Sit to lying activity   Assist level: Supervision or verbal cues  Lying to sitting activity   Assist level: Supervision or verbal cues  Mobility details Bed mobility details: Visual cues/gestures for precautions/safety;Verbal cues for techniques   Transfers Sit to stand transfer   Sit to stand assist level: Touching or steadying assistance (Pt > 75%/lift 1 leg) Sit to stand assistive device: Walker  Chair/bed transfer   Chair/bed transfer method: Stand pivot;Ambulatory Chair/bed transfer assist level: Touching or steadying assistance (Pt > 75%) Chair/bed transfer assistive device: Armrests;Walker   Chair/bed transfer details: Verbal cues for sequencing;Verbal cues for Naval architect transfer assistive device: Publishing rights manager transfer assist level: Touching or steadying assistance (Pt > 75%, lift 1 leg)    Locomotion Ambulation      Max distance: 200 Assist level: Touching or steadying assistance (Pt > 75%)  Walk 10 feet activity   Assist level: Touching or steadying assistance (Pt > 75%)  Walk 50 feet with 2 turns activity   Assist level: Touching or steadying assistance (Pt > 75%)  Walk 150 feet activity   Assist level: Touching or steadying assistance (Pt > 75%)     Stairs   Stairs assistive device: 2 hand rails Max number of stairs: 4 Stairs assist level: Touching or steadying assistance (Pt > 75%)  Walk up/down 1 step activity     Walk up/down 1 step (curb) assist level: Touching or steadying assistance (Pt > 75%)  Walk up/down 4 steps activity   Walk up/down 4 steps assist level: Touching or steadying assistance (Pt > 75%)                  Cognition Comprehension Comprehension assist level: Follows complex conversation/direction with extra time/assistive device  Expression Expression assist level: Expresses complex ideas: With extra time/assistive device  Social Interaction Social Interaction assist level: Interacts appropriately with others - No medications needed.  Problem Solving Problem solving assist level: Solves complex 90% of the time/cues < 10% of the time  Memory Memory assist level: More than reasonable amount of time    Therapy/Group: Individual Therapy  Dub Amis 04/02/2015, 2:27 PM

## 2015-04-02 NOTE — Discharge Instructions (Addendum)
Inpatient Rehab Discharge Instructions  Cody Hodge Discharge date and time: 04/06/15   Activities/Precautions/ Functional Status: Activity: no lifting, driving, or strenuous exercise till cleared by MD Diet: regular diet Wound Care: Wash with soap and water. Pat dry. NO creams, ointments or lotions. Contact MD if you notice any redness, swelling. drainage or develop fever or chills.  Functional status:  ___ No restrictions     ___ Walk up steps independently ___ 24/7 supervision/assistance   ___ Walk up steps with assistance _X__ Intermittent supervision/assistance  __X_ Bathe/dress independently _X__ Walk with walker    ___ Bathe/dress with assistance ___ Walk Independently    ___ Shower independently ___ Walk with assistance    ___ Shower with assistance _X _ No alcohol     ___ Return to work/school ________  Special Instructions:    COMMUNITY REFERRALS UPON DISCHARGE:    Home Health:   PT, OT, [place]   Date of last service:04/06/2015  Medical Equipment/Items Ordered:WIFE TO GET ELONGATED BEDSIDE COMMODE       My questions have been answered and I understand these instructions. I will adhere to these goals and the provided educational materials after my discharge from the hospital.  Patient/Caregiver Signature _______________________________ Date __________  Clinician Signature _______________________________________ Date __________  Please bring this form and your medication list with you to all your follow-up doctor's appointments.    Information on my medicine - Coumadin   (Warfarin)  This medication education was reviewed with me or my healthcare representative as part of my discharge preparation.  The pharmacist that spoke with me during my hospital stay was:  Darl Pikes, [facility]  Why was Coumadin prescribed for you? Coumadin was prescribed for you because you have a blood clot or a medical condition that can  cause an increased risk of forming blood clots. Blood clots can cause serious health problems by blocking the flow of blood to the heart, lung, or brain. Coumadin can prevent harmful blood clots from forming. As a reminder your indication for Coumadin is:   Select from menu  What test will check on my response to Coumadin? While on Coumadin (warfarin) you will need to have an INR test regularly to ensure that your dose is keeping you in the desired range. The INR (international normalized ratio) number is calculated from the result of the laboratory test called prothrombin time (PT).  If an INR APPOINTMENT HAS NOT ALREADY BEEN MADE FOR YOU please schedule an appointment to have this lab work done by your health care provider within 7 days. Your INR goal is usually a number between:  2 to 3 or your provider may give you a more narrow range like 2-2.5.  Ask your health care provider during an office visit what your goal INR is.  What  do you need to  know  About  COUMADIN? Take Coumadin (warfarin) exactly as prescribed by your healthcare provider about the same time each day.  DO NOT stop taking without talking to the doctor who prescribed the medication.  Stopping without other blood clot prevention medication to take the place of Coumadin may increase your risk of developing a new clot or stroke.  Get refills before you run out.  What do you do if you miss a dose? If you miss a dose, take it as soon as you remember on the same day then continue your regularly scheduled regimen the next day.  Do not take two doses of Coumadin  at the same time.  Important Safety Information A possible side effect of Coumadin (Warfarin) is an increased risk of bleeding. You should call your healthcare provider right away if you experience any of the following: ? Bleeding from an injury or your nose that does not stop. ? Unusual colored urine (red or dark brown) or unusual colored stools (red or black). ? Unusual  bruising for unknown reasons. ? A serious fall or if you hit your head (even if there is no bleeding).  Some foods or medicines interact with Coumadin (warfarin) and might alter your response to warfarin. To help avoid this: ? Eat a balanced diet, maintaining a consistent amount of Vitamin K. ? Notify your provider about major diet changes you plan to make. ? Avoid alcohol or limit your intake to 1 drink for women and 2 drinks for men per day. (1 drink is 5 oz. wine, 12 oz. beer, or 1.5 oz. liquor.)  Make sure that ANY health care provider who prescribes medication for you knows that you are taking Coumadin (warfarin).  Also make sure the healthcare provider who is monitoring your Coumadin knows when you have started a new medication including herbals and non-prescription products.  Coumadin (Warfarin)  Major Drug Interactions  Increased Warfarin Effect Decreased Warfarin Effect  Alcohol (large quantities) Antibiotics (esp. Septra/Bactrim, Flagyl, Cipro) Amiodarone (Cordarone) Aspirin (ASA) Cimetidine (Tagamet) Megestrol (Megace) NSAIDs (ibuprofen, naproxen, etc.) Piroxicam (Feldene) Propafenone (Rythmol SR) Propranolol (Inderal) Isoniazid (INH) Posaconazole (Noxafil) Barbiturates (Phenobarbital) Carbamazepine (Tegretol) Chlordiazepoxide (Librium) Cholestyramine (Questran) Griseofulvin Oral Contraceptives Rifampin Sucralfate (Carafate) Vitamin K   Coumadin (Warfarin) Major Herbal Interactions  Increased Warfarin Effect Decreased Warfarin Effect  Garlic Ginseng Ginkgo biloba Coenzyme Q10 Green tea St. Johns wort    Coumadin (Warfarin) FOOD Interactions  Eat a consistent number of servings per week of foods HIGH in Vitamin K (1 serving =  cup)  Collards (cooked, or boiled & drained) Kale (cooked, or boiled & drained) Mustard greens (cooked, or boiled & drained) Parsley *serving size only =  cup Spinach (cooked, or boiled & drained) Swiss chard (cooked, or boiled  & drained) Turnip greens (cooked, or boiled & drained)  Eat a consistent number of servings per week of foods MEDIUM-HIGH in Vitamin K (1 serving = 1 cup)  Asparagus (cooked, or boiled & drained) Broccoli (cooked, boiled & drained, or raw & chopped) Brussel sprouts (cooked, or boiled & drained) *serving size only =  cup Lettuce, raw (green leaf, endive, romaine) Spinach, raw Turnip greens, raw & chopped   These websites have more information on Coumadin (warfarin):  FailFactory.se; VeganReport.com.au;

## 2015-04-02 NOTE — Progress Notes (Addendum)
Occupational Therapy Session Note  Patient Details  Name: Cody Hodge MRN: [id number] Date of Birth: Nov 20, 1945  Today's Date: 04/02/2015 OT Individual Time: 1445-1530 OT Individual Time Calculation (min): 45 min    Short Term Goals: Week 1:  OT Short Term Goal 1 (Week 1):  (STG= LTG)  Skilled Therapeutic Interventions/Progress Updates:  Engaged in functional mobility, transfers, sit to stand, and therapeutic activities.  Pt. Ambulated to ADL apt with RW.  Practiced toilet and shower stall transfer. Wife present.  Pt interested in obtaining an elongated toilet for home.  Transferred to  bed and went  from sit to supine and back with SBA.  Ambulated to gym with RW.  Did there ex with sit to stand and knee flexion.  Ambulatred back to room.  Applied ice to both knees     Therapy Documentation Precautions:  Precautions Precautions: Fall, Knee Required Braces or Orthoses:  (KI Discharged) Knee Immobilizer - Right: Discontinue once straight leg raise with < 10 degree lag Knee Immobilizer - Left: Discontinue once straight leg raise with < 10 degree lag Restrictions Weight Bearing Restrictions: Yes RLE Weight Bearing: Weight bearing as tolerated LLE Weight Bearing: Weight bearing as tolerated Other Position/Activity Restrictions: WBAT   Pain: Pain Assessment Pain Assessment: 0-10 Pain Score: 1 in both legs supine; with lifting 5/10 on right  Pain Type: Surgical pain Pain Location: Knee Pain Orientation: Right;Left Pain Descriptors / Indicators: Aching Pain Onset: On-going Pain Intervention(s): Medication (See eMAR)        Other Treatments:    Function:   Eating Eating   Eating Assist Level: No help, No cues           Grooming Oral Care,Brush Teeth, Clean Dentures Activity:             Wash, Rinse, Dry Face Activity          Wash, Rinse, Dry Hands Activity          Brush, Comb Hair Activity        Shave Activity          Apply Makeup Activity                                                              Bathing Bathing position      Bathing parts      Bathing assist         Upper Body Dressing/Undressing Upper body dressing                    Upper body assist         Lower Body Dressing/Undressing Lower body dressing                                  Lower body assist         Toileting Toileting   Toileting steps completed by patient: Adjust clothing prior to toileting;Adjust clothing after toileting Toileting steps completed by helper: Performs perineal hygiene Toileting Assistive Devices: Grab bar or rail  Toileting assist Assist level: Supervision or verbal cues    Bed Mobility Roll left and right activity      Sit to lying activity      Lying to  sitting activity      Mobility details     Transfers Sit to stand transfer        Chair/bed transfer              Toilet transfer                Tub/shower transfer Tub/shower transfer activity did not occur: Simulated Tub/shower assistive device: Shower chair;Walk in [employer]   Assist level into tub: Touching or steadying assistance (Pt > 75%/lift 1 leg) Assist level out of tub: Touching or steadying assistance (Pt > 75%)   Cognition Comprehension Comprehension assist level: Follows complex conversation/direction with extra time/assistive device  Expression Expression assist level: Expresses complex ideas: With extra time/assistive device  Social Interaction Social Interaction assist level: Interacts appropriately with others - No medications needed.  Problem Solving Problem solving assist level: Solves complex 90% of the time/cues < 10% of the time  Memory  Memory assist level: More than reasonable amount of time    Therapy/Group: Individual Therapy  Lisa Roca 04/02/2015, 7:23 PM

## 2015-04-02 NOTE — Plan of Care (Signed)
Problem: RH BOWEL ELIMINATION Goal: RH STG MANAGE BOWEL WITH ASSISTANCE STG Manage Bowel with mod I Assistance.  Outcome: Not Progressing Patient given laxatives no BM yet Goal: RH STG MANAGE BOWEL W/MEDICATION W/ASSISTANCE STG Manage Bowel with Medication with mod I Assistance.  Outcome: Not Progressing Laxatives given no BM yet

## 2015-04-02 NOTE — Progress Notes (Signed)
Orthopedic Tech Progress Note Patient Details:  Cody Hodge [birth year] [id number] Off cpm at 5:30 pm Patient ID: Cody Hodge, male   DOB: Dec 11, 1945, 69 y.o.   MRN: [id number]   Cody Hodge 04/02/2015, 5:21 PM

## 2015-04-02 NOTE — H&P (Signed)
Physical Medicine and Rehabilitation Admission H&P   CC: Bilateral TKR due to OA   HPI: Cody Hodge is a 69 y.o. male with history of HTN, CAD, OSA, bilaterl knee pain due to endstage OA. Patient elected to undergo B-TKR on 03/29/15 by Dr. Wynelle Link. Post op WBAT with CPM used for PROM. Post op with ABLA and leucocytosis with WBC up to 21.7. PT/OT ongoing and CIR recommended for follow up therapy.   Received 2 doses of Cardizem yesterday  Pt trying to avoid Oxy IR but pt has a lot of pain without it  Review of Systems  HENT: Negative for hearing loss.  Eyes: Negative for blurred vision and double vision.  Respiratory: Negative for cough and shortness of breath.  Cardiovascular: Positive for leg swelling. Negative for chest pain and palpitations.  Gastrointestinal: Positive for constipation. Negative for heartburn, nausea and abdominal pain.  Genitourinary: Negative for dysuria.   Hesitancy. Gets up 2-3 times every night  Musculoskeletal: Positive for back pain (at times) and joint pain. Negative for myalgias.  Neurological: Negative for headaches.  Psychiatric/Behavioral: The patient has insomnia (due to frequency). The patient is not nervous/anxious.      Past Medical History  Diagnosis Date  . Tremor   . Knee arthropathy   . OSA (obstructive sleep apnea)     mild to moderate refused CPAP  . Mixed hyperlipidemia   . Hypercholesteremia   . Melanoma   . PAF (paroxysmal atrial fibrillation)     CHADS VASC score 2 (age>65 and HTN)  . Arthritis   . ED (erectile dysfunction)   . HTN (hypertension)   . BPH (benign prostatic hyperplasia)   . Melanoma   . Coronary artery disease 09/2014    50% mid to distal LAD, 50-70% ostial first diag, 40% ostial and mid left circ.  Marland Kitchen Hemorrhoids     associated with bowel movements    Past Surgical History  Procedure Laterality Date  . Left and right  arthroscopy    . Lasik    . Hydrocele excision / repair    . Left forearm melanoma    . Left heart catheterization with coronary angiogram N/A 10/06/2014    Procedure: LEFT HEART CATHETERIZATION WITH CORONARY ANGIOGRAM; Surgeon: Sinclair Grooms, MD; Location[facility] CATH LAB; Service: Cardiovascular; Laterality: N/A;  . Cardiac catheterization      2'16- only ever heart cath  . Total knee arthroplasty Bilateral 03/29/2015    Procedure: TOTAL KNEE BILATERAL; Surgeon: Gaynelle Arabian, MD; Location: [facility] ORS; Service: Orthopedics; Laterality: Bilateral; spinal anes. placed in OR    Family History  Problem Relation Age of Onset  . Dementia Mother   . Heart attack Father   . Heart disease Father     Social History: Married. Independent and still works. He reports that he has never smoked. He does not have any smokeless tobacco history on file. He reports that he drinks a couple of beers or wine on the weekends. His drug history is not on file.    Allergies: No Active Allergies    Medications Prior to Admission  Medication Sig Dispense Refill  . atorvastatin (LIPITOR) 20 MG tablet Take 20 mg by mouth daily.    . Coenzyme Q10 (COQ10) 100 MG CAPS Take 100 mg by mouth at bedtime.    Marland Kitchen diltiazem (CARDIZEM CD) 120 MG 24 hr capsule Take 1 capsule (120 mg total) by mouth daily. 90 capsule 3  . finasteride (PROSCAR) 5 MG tablet Take 5 mg  by mouth daily.     . flecainide (TAMBOCOR) 150 MG tablet Take 1 tablet (150 mg total) by mouth 2 (two) times daily. 180 tablet 2  . ibuprofen (ADVIL,MOTRIN) 200 MG tablet Take 400 mg by mouth 2 (two) times daily as needed (pain).    Marland Kitchen OVER THE COUNTER MEDICATION Take 1 tablet by mouth 2 (two) times daily. grapeseed oil 75 mg/resveratrol 30 mg/ vitamin c 100 mg    . rivaroxaban (XARELTO) 20 MG TABS tablet Take 1 tablet (20 mg total) by mouth daily with supper.  30 tablet 6  . tamsulosin (FLOMAX) 0.4 MG CAPS capsule Take 0.4 mg by mouth at bedtime.     . triamcinolone (NASACORT) 55 MCG/ACT AERO nasal inhaler Place 1 spray into both nostrils at bedtime.    . TURMERIC PO Take 400 mg by mouth 2 (two) times daily.      Home: Home Living Family/patient expects to be discharged to:: Inpatient rehab Living Arrangements: Spouse/significant other Additional Comments: has borrowed shower seat and AE kit. Does not have DME for low commode  Functional History: Prior Function Level of Independence: Independent Comments: very active, plays tennis  Functional Status:  Mobility: Bed Mobility Overal bed mobility: Needs Assistance Bed Mobility: Sit to Supine Supine to sit: Min assist Sit to supine: Min assist General bed mobility comments: assist for LEs Transfers Overall transfer level: Needs assistance Equipment used: Rolling walker (2 wheeled) Transfers: Sit to/from Stand Sit to Stand: Min assist, +2 safety/equipment General transfer comment: Bed raised high. Assist to rise and steady.  Ambulation/Gait Ambulation/Gait assistance: Min assist, +2 physical assistance, +2 safety/equipment Ambulation Distance (Feet): 6 Feet Assistive device: Rolling walker (2 wheeled) Gait Pattern/deviations: Step-to pattern, Wide base of support, Trunk flexed General Gait Details: cues for RW position and step length; pt with diminished hip control on right d/t numbness from epidural (although improved compared to am session)    ADL: ADL Overall ADL's : Needs assistance/impaired Grooming: Set up, Sitting Upper Body Bathing: Set up, Sitting Lower Body Bathing: Moderate assistance, Sit to/from stand Upper Body Dressing : Set up, Sitting Lower Body Dressing: Maximal assistance, Sit to/from stand Toilet Transfer: Minimal assistance, +2 for safety/equipment, Ambulation (recliner) General ADL Comments: Pt has AE kit but has not used it. Educated  on use of this. Pt using arms heavily on RW: not released for ADLs  Cognition: Cognition Overall Cognitive Status: Within Functional Limits for tasks assessed Orientation Level: Oriented X4 Cognition Arousal/Alertness: Awake/alert Behavior During Therapy: WFL for tasks assessed/performed Overall Cognitive Status: Within Functional Limits for tasks assessed   Blood pressure 132/61, pulse 94, temperature 98.3 F (36.8 C), temperature source Oral, resp. rate 16, height $RemoveBe'5\' 10"'OAijxPxjD$  (1.778 m), weight 87.998 kg (194 lb), SpO2 99 %. Physical Exam  Nursing note and vitals reviewed. Constitutional: He is oriented to person, place, and time. He appears well-developed and well-nourished.  HENT:  Head: Normocephalic and atraumatic.  Eyes: Conjunctivae and EOM are normal. Pupils are equal, round, and reactive to light.  Neck: Normal range of motion. Neck supple.  Cardiovascular: Normal rate and regular rhythm.  Respiratory: Effort normal and breath sounds normal. No respiratory distress. He has no wheezes.  GI: Soft. Bowel sounds are normal. He exhibits no distension. There is no tenderness.  Musculoskeletal: He exhibits edema and tenderness.  Bilateral knees with min edema and dry dressing in place.  Neurological: He is alert and oriented to person, place, and time.  Speech clear. Follows commands without difficulty.  Motor  strength is normal bilateral upper extremities Strength in LE-- 2 minus in the right hip flexor 3 minus knee extensor 4 minus ankle dorsiflexor 3/5 in the left hip flexor 4 minus left knee extensor and 4+ in the left ankle dorsiflexor  Skin: Skin is warm and dry. No rash noted.  Psychiatric: He has a normal mood and affect. His behavior is normal. Judgment and thought content normal.     Lab Results Last 48 Hours    Results for orders placed or performed during the hospital encounter of 03/29/15 (from the past 48 hour(s))  CBC Status: Abnormal   Collection Time:  03/30/15 4:35 AM  Result Value Ref Range   WBC 14.6 (H) 4.0 - 10.5 K/uL   RBC 3.91 (L) 4.22 - 5.81 MIL/uL   Hemoglobin 10.8 (L) 13.0 - 17.0 g/dL   HCT 33.3 (L) 39.0 - 52.0 %   MCV 85.2 78.0 - 100.0 fL   MCH 27.6 26.0 - 34.0 pg   MCHC 32.4 30.0 - 36.0 g/dL   RDW 13.5 11.5 - 15.5 %   Platelets 259 150 - 400 K/uL  Basic metabolic panel Status: Abnormal   Collection Time: 03/30/15 4:35 AM  Result Value Ref Range   Sodium 137 135 - 145 mmol/L   Potassium 3.8 3.5 - 5.1 mmol/L   Chloride 104 101 - 111 mmol/L   CO2 24 22 - 32 mmol/L   Glucose, Bld 150 (H) 65 - 99 mg/dL   BUN 16 6 - 20 mg/dL   Creatinine, Ser 0.96 0.61 - 1.24 mg/dL   Calcium 8.4 (L) 8.9 - 10.3 mg/dL   GFR calc non Af Amer >60 >60 mL/min   GFR calc Af Amer >60 >60 mL/min    Comment: (NOTE) The eGFR has been calculated using the CKD EPI equation. This calculation has not been validated in all clinical situations. eGFR's persistently <60 mL/min signify possible Chronic Kidney Disease.    Anion gap 9 5 - 15  Protime-INR Status: None   Collection Time: 03/30/15 4:35 AM  Result Value Ref Range   Prothrombin Time 15.1 11.6 - 15.2 seconds   INR 1.17 0.00 - 1.49  CBC Status: Abnormal   Collection Time: 03/31/15 5:45 AM  Result Value Ref Range   WBC 21.7 (H) 4.0 - 10.5 K/uL   RBC 3.49 (L) 4.22 - 5.81 MIL/uL   Hemoglobin 9.6 (L) 13.0 - 17.0 g/dL   HCT 29.1 (L) 39.0 - 52.0 %   MCV 83.4 78.0 - 100.0 fL   MCH 27.5 26.0 - 34.0 pg   MCHC 33.0 30.0 - 36.0 g/dL   RDW 13.7 11.5 - 15.5 %   Platelets 270 150 - 400 K/uL  Basic metabolic panel Status: Abnormal   Collection Time: 03/31/15 5:45 AM  Result Value Ref Range   Sodium 137 135 - 145 mmol/L   Potassium 3.9 3.5 - 5.1 mmol/L   Chloride 101 101 - 111 mmol/L   CO2 28 22 - 32 mmol/L    Glucose, Bld 135 (H) 65 - 99 mg/dL   BUN 24 (H) 6 - 20 mg/dL   Creatinine, Ser 0.99 0.61 - 1.24 mg/dL   Calcium 8.6 (L) 8.9 - 10.3 mg/dL   GFR calc non Af Amer >60 >60 mL/min   GFR calc Af Amer >60 >60 mL/min    Comment: (NOTE) The eGFR has been calculated using the CKD EPI equation. This calculation has not been validated in all clinical situations. eGFR's persistently <60 mL/min  signify possible Chronic Kidney Disease.    Anion gap 8 5 - 15  Protime-INR Status: Abnormal   Collection Time: 03/31/15 5:45 AM  Result Value Ref Range   Prothrombin Time 15.8 (H) 11.6 - 15.2 seconds   INR 1.25 0.00 - 1.49      Imaging Results (Last 48 hours)    No results found.       Medical Problem List and Plan: 1. Functional deficits secondary to Decline in functional abilities following total knee replacements 2. DVT Prophylaxis/Anticoagulation: Pharmaceutical: Lovenox till coumadin therapeutic 3. Pain Management: Continue prn oxycodone.  4. Mood: LCSW to follow for evaluation and support.  5. Neuropsych: This patient is capable of making decisions on his own behalf. 6. Skin/Wound Care: Routine pressure relief measures. Maintain adequate nutrition and hydration status 7. Fluids/Electrolytes/Nutrition: Monitor I/O. Maintain adequate nutrition and hydration.  8. Leucocytosis: Monitor for signs of infection. Check UA/UCS.  9. Dehydration: Push po fluids. Check lytes in am.  10. Constipation: continue Miralax.     Post Admission Physician Evaluation: 1. Functional deficits secondary to Decline in functional abilities following total knee replacements 2. Patient is admitted to receive collaborative, interdisciplinary care between the physiatrist, rehab nursing staff, and therapy team. 3. Patient's level of medical complexity and substantial therapy needs in context of that medical necessity cannot be provided at a lesser intensity  of care such as a SNF. 4. Patient has experienced substantial functional loss from his/her baseline which was documented above under the "Functional History" and "Functional Status" headings. Judging by the patient's diagnosis, physical exam, and functional history, the patient has potential for functional progress which will result in measurable gains while on inpatient rehab. These gains will be of substantial and practical use upon discharge in facilitating mobility and self-care at the household level. 5. Physiatrist will provide 24 hour management of medical needs as well as oversight of the therapy plan/treatment and provide guidance as appropriate regarding the interaction of the two. 6. 24 hour rehab nursing will assist with bladder management, bowel management, safety, skin/wound care, disease management, medication administration, pain management and patient education and help integrate therapy concepts, techniques,education, etc. 7. PT will assess and treat for/with: pre gait, gait training, endurance , safety, equipment, neuromuscular re education. Goals are: Mod I mobility with walker. 8. OT will assess and treat for/with: ADLs, Cognitive perceptual skills, Neuromuscular re education, safety, endurance, equipment. Goals are: Mod I ADLs. Therapy may proceed with showering this patient. 9. SLP will assess and treat for/with: NA. Goals are: NA. 10. Case Management and Social Worker will assess and treat for psychological issues and discharge planning. 11. Team conference will be held weekly to assess progress toward goals and to determine barriers to discharge. 12. Patient will receive at least 3 hours of therapy per day at least 5 days per week. 13. ELOS: 7d  14. Prognosis: excellent     Charlett Blake M.D. [facility] FAAPM&R (Sports Med, Neuromuscular Med) Diplomate Am Board of Electrodiagnostic Med  03/31/2015

## 2015-04-02 NOTE — Evaluation (Signed)
Physical Therapy Assessment and Plan  Patient Details  Name: Cody Hodge MRN: [id number] Date of Birth: 1946-01-29  PT Diagnosis: Difficulty walking and Muscle weakness Rehab Potential: Good ELOS: 7 days    Today's Date: 04/02/2015 PT Individual Time: 0900-1000 PT Individual Time Calculation (min): 60 min    Problem List:  Patient Active Problem List   Diagnosis Date Noted  . OA (osteoarthritis) of knee 03/29/2015  . Abnormal myocardial perfusion study 10/06/2014  . Dyspnea on exertion 09/26/2014  . Family history of early CAD 09/26/2014  . HTN (hypertension)   . Mixed hyperlipidemia   . OSA (obstructive sleep apnea)   . PAF (paroxysmal atrial fibrillation)     Past Medical History:  Past Medical History  Diagnosis Date  . Tremor   . Knee arthropathy   . OSA (obstructive sleep apnea)     mild to moderate refused CPAP  . Mixed hyperlipidemia   . Hypercholesteremia   . Melanoma   . PAF (paroxysmal atrial fibrillation)     CHADS VASC score 2 (age>65 and HTN)  . Arthritis   . ED (erectile dysfunction)   . HTN (hypertension)   . BPH (benign prostatic hyperplasia)   . Melanoma   . Coronary artery disease 09/2014    50% mid to distal LAD, 50-70% ostial first diag, 40% ostial and mid left circ.  Marland Kitchen Hemorrhoids     associated with bowel movements   Past Surgical History:  Past Surgical History  Procedure Laterality Date  . Left and right arthroscopy    . Lasik    . Hydrocele excision / repair    . Left forearm melanoma    . Left heart catheterization with coronary angiogram N/A 10/06/2014    Procedure: LEFT HEART CATHETERIZATION WITH CORONARY ANGIOGRAM;  Surgeon: Sinclair Grooms, MD;  Location: [facility] CATH LAB;  Service: Cardiovascular;  Laterality: N/A;  . Cardiac catheterization      2'16- only ever heart cath  . Total knee arthroplasty Bilateral 03/29/2015    Procedure: TOTAL KNEE BILATERAL;  Surgeon: Gaynelle Arabian, MD;  Location: [facility] ORS;  Service: Orthopedics;   Laterality: Bilateral;  spinal anes. placed in OR    Assessment & Plan Clinical Impression: Patient is a 69 y.o. male with history of HTN, CAD, OSA, bilaterl knee pain due to endstage OA. Patient elected to undergo B-TKR on 03/29/15 by Dr. Wynelle Link. Post op WBAT with CPM used for PROM. Post op with ABLA and leucocytosis with WBC up to 21.7. PT/OT ongoing and CIR recommended for follow up therapy.   Patient transferred to CIR on 04/01/2015 .   Patient currently requires min with mobility secondary to muscle weakness and decreased cardiorespiratoy endurance.  Prior to hospitalization, patient was independent  with mobility and lived with Spouse in a Other(Comment) (Condo on 14th floor) home.  Home access is  Elevator.  Patient will benefit from skilled PT intervention to maximize safe functional mobility, minimize fall risk and decrease caregiver burden for planned discharge home with intermittent assist.  Anticipate patient will benefit from follow up [facility] at discharge.  PT - End of Session Activity Tolerance: Tolerates 30+ min activity with multiple rests Endurance Deficit: Yes PT Assessment Rehab Potential (ACUTE/IP ONLY): Good Barriers to Discharge: Decreased caregiver support PT Patient demonstrates impairments in the following area(s): Balance;Endurance;Motor PT Transfers Functional Problem(s): Bed Mobility;Bed to Chair;Car PT Locomotion Functional Problem(s): Ambulation;Stairs PT Plan PT Intensity: Minimum of 1-2 x/day ,45 to 90 minutes PT Frequency: 5 out of 7  days PT Duration Estimated Length of Stay: 7 days  PT Treatment/Interventions: Ambulation/gait training;Balance/vestibular training;Discharge planning;Community reintegration;DME/adaptive equipment instruction;Functional mobility training;Patient/family education;Pain management;Neuromuscular re-education;Psychosocial support;Therapeutic Exercise;Therapeutic Activities;Stair training;UE/LE Strength taining/ROM;UE/LE Coordination  activities PT Transfers Anticipated Outcome(s): mod I transfers PT Locomotion Anticipated Outcome(s): mod I for gait, mod I for stairs PT Recommendation Follow Up Recommendations: Home health PT;Outpatient PT Patient destination: Home Equipment Recommended: To be determined  Skilled Therapeutic Intervention PT evaluation completed and treatment plan initiated. Treatment focused on B knee ROM and strengthening to include knee flex 3 sets x 10 reps each, knee ext 1 set x 10 reps each and quad sets 1 set x 10 reps each. Pt performed multiple transfers with min guard to min A with verbal cues for technique and safety. Pt's walker adjusted to appropriate height. Following treatment pt returned to room and left sitting up in w/c with call bell within reach.   PT Evaluation Precautions/Restrictions Precautions Precautions: Fall;Knee Restrictions Weight Bearing Restrictions: Yes RLE Weight Bearing: Weight bearing as tolerated LLE Weight Bearing: Weight bearing as tolerated General Chart Reviewed: Yes Family/Caregiver Present: No  Pain Pt c/o mild pain in L knee and mod pain in R knee.  Home Living/Prior Functioning Home Living Available Help at Discharge: Family;Available PRN/intermittently Type of Home: Other(Comment) (Condo on 14th floor) Home Access: Elevator Home Layout: One level  Lives With: Spouse Prior Function Level of Independence: Independent with gait;Independent with transfers  Able to Take Stairs?: Yes Driving: Yes Vocation: Full time employment Vocation Requirements: works at desk, main entrance to office 4 stairs with B rails but only reach one to enter, back entrance no stairs, building also has a ramp to enter Comments: very active, plays tennis Vision/Perception  As per OT evaluation.  Cognition Overall Cognitive Status: Within Functional Limits for tasks assessed Orientation Level: Oriented X4 Memory: Impaired Memory Impairment: Decreased recall of new  information Awareness: Appears intact Problem Solving: Appears intact Safety/Judgment: Appears intact Sensation Sensation Light Touch: Appears Intact Proprioception: Appears Intact Motor  Motor Motor: Within Functional Limits  Trunk/Postural Assessment  Cervical Assessment Cervical Assessment: Within Functional Limits Thoracic Assessment Thoracic Assessment: Within Functional Limits Lumbar Assessment Lumbar Assessment: Within Functional Limits Postural Control Postural Control: Within Functional Limits  Balance Balance Balance Assessed: Yes Static Sitting Balance Static Sitting - Balance Support: Bilateral upper extremity supported;Feet supported Static Sitting - Level of Assistance: 5: Stand by assistance Static Standing Balance Static Standing - Balance Support: Bilateral upper extremity supported;During functional activity Static Standing - Level of Assistance: 5: Stand by assistance Dynamic Standing Balance Dynamic Standing - Balance Support: Bilateral upper extremity supported;During functional activity Dynamic Standing - Level of Assistance: 4: Min assist Extremity Assessment B UEs as per OT evaluation.    RLE Assessment RLE Assessment: Exceptions to [facility] RLE AROM (degrees) Overall AROM Right Lower Extremity: Deficits;Due to pain;Other (Comment);Due to decreased strength (due to swelling) RLE Overall AROM Comments: hip limited secondary to pain, ankle WFLs, knee AROM flex to ext 80 to 16, PROM to 0 degrees ext, performs SLR with less than 10 degree lag RLE Strength RLE Overall Strength: Deficits;Due to pain RLE Overall Strength Comments: hip and knee WFLs, knee 3-/5 LLE Assessment LLE Assessment: Exceptions to WFL LLE AROM (degrees) Overall AROM Left Lower Extremity: Deficits;Due to decreased strength;Due to pain LLE Overall AROM Comments: hip flexion limited secondary to pain, ankle WFLs, knee flex to ext 82 to 16 degrees, PROM ext 5 degrees from full ext, perform  SLR with less than 10 degree  lag LLE Strength LLE Overall Strength: Deficits LLE Overall Strength Comments: hip and ankle WFLs, knee 3-/5  Function:  Bed Mobility Roll left and right activity   Assist level: No help, No cues, assistive device, takes more than a reasonable amount of time     Lying to sitting activity   Assist level: Supervision or verbal cues  Mobility details     Transfers Sit to stand transfer   Sit to stand assist level: Touching or steadying assistance (Pt > 75%/lift 1 leg) Sit to stand assistive device: Walker (increased bed height)  Chair/bed transfer   Chair/bed transfer method: Stand pivot;Ambulatory Chair/bed transfer assist level: Touching or steadying assistance (Pt > 75%) Chair/bed transfer assistive device: Walker   Chair/bed transfer details: Verbal cues for sequencing;Verbal cues for Development worker, international aid transfer activity did not occur: Safety/medical concerns        Locomotion Ambulation     Max distance: 150 Assist level: Touching or steadying assistance (Pt > 75%)  Walk 10 feet activity   Assist level: Touching or steadying assistance (Pt > 75%)  Walk 50 feet with 2 turns activity   Assist level: Touching or steadying assistance (Pt > 75%)  Walk 150 feet activity   Assist level: Touching or steadying assistance (Pt > 75%)  Walk 10 feet on uneven surfaces activity Walk 10 feet on uneven surfaces activity did not occur: Safety/medical concerns    Stairs   Stairs assistive device: 2 hand rails Max number of stairs: 4 Stairs assist level: Touching or steadying assistance (Pt > 75%)  Walk up/down 1 step activity     Walk up/down 1 step (curb) assist level: Touching or steadying assistance (Pt > 75%)  Walk up/down 4 steps activity   Walk up/down 4 steps assist level: Touching or steadying assistance (Pt > 75%)  Walk up/down 12 steps activity Walk up/down 12 steps activity did not occur: Safety/medical concerns     Pick up small objects from floor Pick up small object from the floor (from standing position) activity did not occur: Safety/medical concerns    Wheelchair   Type: Manual Max wheelchair distance: 150 Assist Level: Supervision or verbal cues  Wheel 50 feet with 2 turns activity   Assist Level: Supervision or verbal cues  Wheel 150 feet activity   Assist Level: Supervision or verbal cues   Cognition Comprehension Comprehension assist level: Understands complex 90% of the time/cues 10% of the time  Expression Expression assist level: Expresses complex 90% of the time/cues < 10% of the time  Social Interaction Social Interaction assist level: Interacts appropriately with others - No medications needed.  Problem Solving Problem solving assist level: Solves complex 90% of the time/cues < 10% of the time  Memory Memory assist level: More than reasonable amount of time    Refer to Care Plan for Long Term Goals  Recommendations for other services: None  Discharge Criteria: Patient will be discharged from PT if patient refuses treatment 3 consecutive times without medical reason, if treatment goals not met, if there is a change in medical status, if patient makes no progress towards goals or if patient is discharged from hospital.  The above assessment, treatment plan, treatment alternatives and goals were discussed and mutually agreed upon: by patient  Dub Amis 04/02/2015, 12:10 PM

## 2015-04-02 NOTE — Progress Notes (Signed)
VASCULAR LAB PRELIMINARY  PRELIMINARY  PRELIMINARY  PRELIMINARY  Bilateral lower extremity venous duplex  completed.    Preliminary report:  Bilateral:  No evidence of DVT, superficial thrombosis, or Baker's Cyst.    Rockie Vawter, RVT 04/02/2015, 12:10 PM

## 2015-04-03 ENCOUNTER — Inpatient Hospital Stay (HOSPITAL_COMMUNITY): Payer: No Typology Code available for payment source | Admitting: Occupational Therapy

## 2015-04-03 ENCOUNTER — Inpatient Hospital Stay (HOSPITAL_COMMUNITY): Payer: Medicare Other | Admitting: Physical Therapy

## 2015-04-03 DIAGNOSIS — D72829 Elevated white blood cell count, unspecified: Secondary | ICD-10-CM | POA: Diagnosis present

## 2015-04-03 DIAGNOSIS — D62 Acute posthemorrhagic anemia: Secondary | ICD-10-CM | POA: Diagnosis present

## 2015-04-03 HISTORY — DX: Acute posthemorrhagic anemia: D62

## 2015-04-03 LAB — CBC
HEMATOCRIT: 26.3 % — AB (ref 39.0–52.0)
HEMOGLOBIN: 8.8 g/dL — AB (ref 13.0–17.0)
MCH: 28.2 pg (ref 26.0–34.0)
MCHC: 33.5 g/dL (ref 30.0–36.0)
MCV: 84.3 fL (ref 78.0–100.0)
Platelets: 328 10*3/uL (ref 150–400)
RBC: 3.12 MIL/uL — AB (ref 4.22–5.81)
RDW: 13.5 % (ref 11.5–15.5)
WBC: 10.7 10*3/uL — ABNORMAL HIGH (ref 4.0–10.5)

## 2015-04-03 LAB — URINE CULTURE: CULTURE: NO GROWTH

## 2015-04-03 LAB — PROTIME-INR
INR: 1.97 — ABNORMAL HIGH (ref 0.00–1.49)
Prothrombin Time: 22.3 seconds — ABNORMAL HIGH (ref 11.6–15.2)

## 2015-04-03 MED ORDER — OXYCODONE HCL 5 MG PO TABS
10.0000 mg | ORAL_TABLET | ORAL | Status: DC | PRN
  Administered 2015-04-03 – 2015-04-06 (×8): 10 mg via ORAL
  Filled 2015-04-03 (×8): qty 2

## 2015-04-03 MED ORDER — POLYETHYLENE GLYCOL 3350 17 G PO PACK
17.0000 g | PACK | Freq: Every day | ORAL | Status: DC
  Administered 2015-04-03 – 2015-04-04 (×2): 17 g via ORAL
  Filled 2015-04-03 (×4): qty 1

## 2015-04-03 MED ORDER — POLYETHYLENE GLYCOL 3350 17 G PO PACK: 17.0000 g | PACK | Freq: Every day | ORAL | Status: DC

## 2015-04-03 MED ORDER — WARFARIN SODIUM 3 MG PO TABS
3.0000 mg | ORAL_TABLET | Freq: Once | ORAL | Status: AC
  Administered 2015-04-03: 3 mg via ORAL
  Filled 2015-04-03: qty 1

## 2015-04-03 NOTE — Progress Notes (Signed)
ANTICOAGULATION CONSULT NOTE - Follow Up Consult  Pharmacy Consult for coumadin Indication: VTE prophylaxis  No Active Allergies  Patient Measurements: Height: 5\' 10"  (177.8 cm) Weight: 200 lb 4.8 oz (90.855 kg) IBW/kg (Calculated) : 73 Heparin Dosing Weight:   Vital Signs: Temp: 98.7 F (37.1 C) (08/22 0559) Temp Source: Oral (08/22 0559) BP: 106/54 mmHg (08/22 0559) Pulse Rate: 86 (08/22 0559)  Labs:  Recent Labs  04/01/15 0445 04/01/15 1645 04/02/15 0543 04/03/15 0517  HGB 8.4* 8.3*  --   --   HCT 25.9* 25.1*  --   --   PLT 240 234  --   --   LABPROT 14.8  --  21.1* 22.3*  INR 1.14  --  1.83* 1.97*  CREATININE  --  0.93  --   --     Estimated Creatinine Clearance: 85 mL/min (by C-G formula based on Cr of 0.93).   Medications:  Scheduled:  . atorvastatin  20 mg Oral Daily  . diltiazem  120 mg Oral Daily  . enoxaparin (LOVENOX) injection  30 mg Subcutaneous Q12H  . finasteride  5 mg Oral Daily  . flecainide  100 mg Oral BID  . methocarbamol  500 mg Oral QHS,MR X 1  . senna-docusate  2 tablet Oral BID  . tamsulosin  0.4 mg Oral QHS  . triamcinolone  1 spray Each Nare QHS  . warfarin   Does not apply Once  . Warfarin - Pharmacist Dosing Inpatient   Does not apply q1800   Infusions:    Assessment: 69 yo male with DVT is currently on subtherapeutic coumadin.  INR today is up a little to 1.97.  Goal of Therapy:  INR 2-2.5 Monitor platelets by anticoagulation protocol: Yes   Plan:  - coumadin 3 mg po x1 - INR in am - continue lovenox 30 mg sq q12h until INR >/= 2  Nysia Dell, Tsz-Yin 04/03/2015,8:15 AM

## 2015-04-03 NOTE — Progress Notes (Signed)
Recreational Therapy Session Note  Patient Details  Name: Cody Hodge MRN: [id number] Date of Birth: 1946/08/01 Today's Date: 04/03/2015  Order received and chart reviewed.  Per team, pt with short LOS and anticipated discharge in the next couple of days.  Full TR eval deferred.  Will monitor through team for community reintegration. [place] 04/03/2015, 3:03 PM

## 2015-04-03 NOTE — Progress Notes (Signed)
Retta Diones, RN Rehab Admission Coordinator Signed Physical Medicine and Rehabilitation PMR Pre-admission 03/31/2015 1:34 PM  Related encounter: Admission (Discharged) from 03/29/2015 in [facility] Collapse All   PMR Admission Coordinator Pre-Admission Assessment  Patient: Cody Hodge is an 69 y.o., male MRN: [id number] DOB: October 27, 1945 Height: $RemoveBefo'5\' 10"'HYrzVYTLyps$  (177.8 cm) Weight: 87.998 kg (194 lb)  Insurance Information HMO: No PPO: PCP: IPA: 80/20: OTHER:  PRIMARY: Medicare A/B Policy#: [id number] Subscriber: Marjorie Smolder CM Name: Phone#: Fax#:  Pre-Cert#: Employer: Retired but works PT as a Geneticist, molecular: Phone #: Name: Checked in [place]. Date: 04/12/10=A and 09/12/10=B Deduct: $1288 Out of Pocket Max: none  Life Max: unlimited CIR: 100% SNF: 100 days Outpatient: 80% Co-Pay: 20% Home Health: 100% Co-Pay: none DME: 80% Co-Pay: 20% Providers: patient's choice  SECONDARY: [employer] Policy#: [id number] Subscriber: Marjorie Smolder CM Name: Phone#: Fax#:  Pre-Cert#: Employer: REtired but works PT Benefits: Phone #: [phone] Name:  Eff. Date: Deduct: Out of Pocket Max: Life Max:  CIR: SNF:  Outpatient: Co-Pay:  Home Health: Co-Pay:  DME: Co-Pay:   Emergency Contact Information Contact Information    Name Relation Home Work Mobile   Brooker,Chris Spouse [phone]  [phone]   [place] Daughter   [phone]     Current Medical History  Patient Admitting Diagnosis: B TKR  History of Present Illness: A 69 y.o. male with history of HTN, CAD, OSA,  bilaterl knee pain due to endstage OA. Patient elected to undergo B-TKR on 03/29/15 by Dr. Wynelle Link. Post op WBAT with CPM used for PROM. PT/OT evaluations to be done today. CIR consulted in anticipation of extensive rehab needs. Patient completed 24 pre-rehabilitation visits with a personal trainer prior to surgery. He is highly motivated to return to recreational tennis next spring. He states he can to straight leg raising with both right and left legs yesterday however today he is unable to do a right straight leg raise.   Past Medical History  Past Medical History  Diagnosis Date  . Tremor   . Knee arthropathy   . OSA (obstructive sleep apnea)     mild to moderate refused CPAP  . Mixed hyperlipidemia   . Hypercholesteremia   . Melanoma   . PAF (paroxysmal atrial fibrillation)     CHADS VASC score 2 (age>65 and HTN)  . Arthritis   . ED (erectile dysfunction)   . HTN (hypertension)   . BPH (benign prostatic hyperplasia)   . Melanoma   . Coronary artery disease 09/2014    50% mid to distal LAD, 50-70% ostial first diag, 40% ostial and mid left circ.  Marland Kitchen Hemorrhoids     associated with bowel movements    Family History  family history includes Dementia in his mother; Heart attack in his father; Heart disease in his father.  Prior Rehab/Hospitalizations:  Has the patient had major surgery during 100 days prior to admission? No. He did have a cardiac cath in 02/16.  Current Medications   Current facility-administered medications:  . 0.9 % sodium chloride infusion, , Intravenous, Continuous, Gaynelle Arabian, MD, Last Rate: 20 mL/hr at 03/30/15 0910, 20 mL/hr at 03/30/15 0910 . acetaminophen (TYLENOL) tablet 650 mg, 650 mg, Oral, Q6H PRN **OR** acetaminophen (TYLENOL) suppository 650 mg, 650 mg, Rectal, Q6H PRN, Gaynelle Arabian, MD . atorvastatin (LIPITOR) tablet 20 mg, 20 mg, Oral, Daily, Gaynelle Arabian, MD, 20 mg at 03/31/15  0935 . bisacodyl (DULCOLAX) suppository 10 mg, 10  mg, Rectal, Daily PRN, Ollen Gross, MD . diltiazem (CARDIZEM CD) 24 hr capsule 120 mg, 120 mg, Oral, Daily, Ollen Gross, MD, 120 mg at 03/31/15 0935 . diphenhydrAMINE (BENADRYL) 12.5 MG/5ML elixir 12.5-25 mg, 12.5-25 mg, Oral, Q4H PRN, Ollen Gross, MD . diphenhydrAMINE (BENADRYL) injection 12.5 mg, 12.5 mg, Intravenous, Q4H PRN **OR** diphenhydrAMINE (BENADRYL) capsule 25 mg, 25 mg, Oral, Q4H PRN, Eilene Ghazi, MD . docusate sodium (COLACE) capsule 100 mg, 100 mg, Oral, BID, Ollen Gross, MD, 100 mg at 03/31/15 0935 . enoxaparin (LOVENOX) injection 30 mg, 30 mg, Subcutaneous, Q12H, Amber Constable, PA-C . finasteride (PROSCAR) tablet 5 mg, 5 mg, Oral, Daily, Ollen Gross, MD, 5 mg at 03/31/15 0935 . flecainide (TAMBOCOR) tablet 100 mg, 100 mg, Oral, BID, Amber Constable, PA-C, 100 mg at 03/31/15 0935 . menthol-cetylpyridinium (CEPACOL) lozenge 3 mg, 1 lozenge, Oral, PRN, 3 mg at 03/30/15 0847 **OR** phenol (CHLORASEPTIC) mouth spray 1 spray, 1 spray, Mouth/Throat, PRN, Ollen Gross, MD . methocarbamol (ROBAXIN) tablet 500 mg, 500 mg, Oral, Q6H PRN, 500 mg at 03/31/15 1140 **OR** methocarbamol (ROBAXIN) 500 mg in dextrose 5 % 50 mL IVPB, 500 mg, Intravenous, Q6H PRN, Ollen Gross, MD, 500 mg at 03/30/15 2157 . metoCLOPramide (REGLAN) tablet 5-10 mg, 5-10 mg, Oral, Q8H PRN **OR** metoCLOPramide (REGLAN) injection 5-10 mg, 5-10 mg, Intravenous, Q8H PRN, Ollen Gross, MD . morphine 2 MG/ML injection 1-2 mg, 1-2 mg, Intravenous, Q2H PRN, Ollen Gross, MD . nalbuphine (NUBAIN) 20 MG/ML injection 5 mg, 5 mg, Intravenous, Q4H PRN **OR** nalbuphine (NUBAIN) 20 MG/ML injection 5 mg, 5 mg, Subcutaneous, Q4H PRN, Eilene Ghazi, MD . nalbuphine (NUBAIN) injection 5 mg, 5 mg, Intravenous, Once PRN **OR** nalbuphine (NUBAIN) injection 5 mg, 5 mg, Subcutaneous, Once PRN, Eilene Ghazi, MD . naloxone [facility]) 2 mg in dextrose 5 % 250 mL infusion,  1-4 mcg/kg/hr, Intravenous, Continuous PRN, Eilene Ghazi, MD . naloxone [facility]) injection 0.4 mg, 0.4 mg, Intravenous, PRN **AND** sodium chloride 0.9 % injection 3 mL, 3 mL, Intravenous, PRN, Eilene Ghazi, MD . ondansetron [facility]) injection 4 mg, 4 mg, Intravenous, Q8H PRN, Eilene Ghazi, MD . ondansetron (ZOFRAN) tablet 4 mg, 4 mg, Oral, Q6H PRN **OR** ondansetron (ZOFRAN) injection 4 mg, 4 mg, Intravenous, Q6H PRN, Ollen Gross, MD . oxyCODONE (Oxy IR/ROXICODONE) immediate release tablet 5-20 mg, 5-20 mg, Oral, Q3H PRN, Ollen Gross, MD, 10 mg at 03/31/15 1140 . polyethylene glycol (MIRALAX / GLYCOLAX) packet 17 g, 17 g, Oral, Daily PRN, Ollen Gross, MD, 17 g at 03/31/15 0935 . scopolamine (TRANSDERM-SCOP) 1 MG/3DAYS 1.5 mg, 1 patch, Transdermal, Once, Eilene Ghazi, MD, Stopped at 03/29/15 1640 . sodium phosphate (FLEET) 7-19 GM/118ML enema 1 enema, 1 enema, Rectal, Once PRN, Ollen Gross, MD . tamsulosin [facility]) capsule 0.4 mg, 0.4 mg, Oral, QHS, Ollen Gross, MD, 0.4 mg at 03/30/15 2047 . traMADol (ULTRAM) tablet 50-100 mg, 50-100 mg, Oral, Q6H PRN, Ollen Gross, MD, 100 mg at 03/30/15 1853 . triamcinolone (NASACORT) nasal inhaler 1 spray, 1 spray, Each Nare, QHS, Ollen Gross, MD, 1 spray at 03/30/15 2048 . warfarin (COUMADIN) tablet 5 mg, 5 mg, Oral, ONCE-1800, Dustin G Zeigler, RPH . warfarin (COUMADIN) video, , Does not apply, Once, Aleda Grana, RPH . Warfarin - Pharmacist Dosing Inpatient, , Does not apply, q1800, Ollen Gross, MD  Patients Current Diet: Diet regular Room service appropriate?: Yes; Fluid consistency:: Thin Diet - low sodium heart healthy  Precautions / Restrictions Precautions Precautions: Knee, Fall Restrictions Weight Bearing Restrictions: No Other Position/Activity Restrictions: WBAT  Has the patient had 2 or more falls or a fall with injury in the past year?No  Prior Activity Level Community (5-7x/wk): Went out daily. Still working  daily as a Chief Executive Officer. Plays tennis 1-2 X a week.  Home Assistive Devices / Equipment Home Assistive Devices/Equipment: Recruitment consultant, Environmental consultant (specify type), Shower chair with back  Prior Device Use: Indicate devices/aids used by the patient prior to current illness, exacerbation or injury? None of the above. No devices in use.  Prior Functional Level Prior Function Level of Independence: Independent Comments: very active, plays tennis  Self Care: Did the patient need help bathing, dressing, using the toilet or eating? Independent  Indoor Mobility: Did the patient need assistance with walking from room to room (with or without device)? Independent  Stairs: Did the patient need assistance with internal or external stairs (with or without device)? Independent  Functional Cognition: Did the patient need help planning regular tasks such as shopping or remembering to take medications? Independent  Current Functional Level Cognition  Overall Cognitive Status: Within Functional Limits for tasks assessed Orientation Level: Oriented X4   Extremity Assessment (includes Sensation/Coordination)  Upper Extremity Assessment: Overall WFL for tasks assessed  Lower Extremity Assessment: RLE deficits/detail, LLE deficits/detail RLE Deficits / Details: knee flexion AAROM to 80*; knee extension 2/5; hip flexion 2/5 (epidural); ankle WFL; right gluts and proximal thigh numb RLE Sensation: decreased light touch LLE Deficits / Details: knee flexion AAROM 90*, knee extension and hip flexion 3/5; ankle WFL; inctact to light touch except gluts    ADLs  Overall ADL's : Needs assistance/impaired Grooming: Set up, Sitting Upper Body Bathing: Set up, Sitting Lower Body Bathing: Moderate assistance, Sit to/from stand Upper Body Dressing : Set up, Sitting Lower Body Dressing: Maximal assistance, Sit to/from stand Toilet Transfer: Minimal assistance, +2 for safety/equipment, Ambulation  (recliner) General ADL Comments: Pt has AE kit but has not used it. Educated on use of this. Pt using arms heavily on RW: not released for ADLs    Mobility  Overal bed mobility: Needs Assistance Bed Mobility: Sit to Supine Supine to sit: Min assist Sit to supine: Min assist General bed mobility comments: assist for LEs    Transfers  Overall transfer level: Needs assistance Equipment used: Rolling walker (2 wheeled) Transfers: Sit to/from Stand Sit to Stand: Min assist, +2 safety/equipment General transfer comment: Bed raised high. Assist to rise and steady. cues on technique, how to walk legs backward to stand, then walk legs forward to sit down     Ambulation / Gait / Stairs / Wheelchair Mobility  Ambulation/Gait Ambulation/Gait assistance: Min assist, +2 physical assistance, +2 safety/equipment Ambulation Distance (Feet): 50 Feet Assistive device: Rolling walker (2 wheeled) Gait Pattern/deviations: Step-to pattern, Trunk flexed General Gait Details: cues for position inside of RW, to stay back from front for balance.    Posture / Balance Balance Overall balance assessment: Needs assistance Sitting-balance support: Feet supported, No upper extremity supported Sitting balance-Leahy Scale: Fair Standing balance support: During functional activity, Bilateral upper extremity supported Standing balance-Leahy Scale: Poor    Special needs/care consideration BiPAP/CPAP: No  CPM Yes, bilateral Continuous Drip IV No Dialysis No  Life Vest No Oxygen No Special Bed No Trach Size No Wound Vac (area) No  Skin B TKR incisions with dressings and knee immobilizers in place.  Bowel mgmt: Las BM 03/29/15 before surgery. Bladder mgmt: Foley catheter to be removed 08/19 at 2:30 pm. Diabetic mgmt No    Previous Home Environment Living Arrangements: Spouse/significant  other Home Care Services: No Additional Comments: has borrowed  shower seat and AE kit. Does not have DME for low commode  Discharge Living Setting Plans for Discharge Living Setting: Patient's home, Lives with (comment), Other (Comment) (Lives with wife in a condominium.) Type of Home at Discharge: Other (Comment) (Condominium) Discharge Home Layout: One level Discharge Home Access: Level entry Does the patient have any problems obtaining your medications?: No  Social/Family/Support Systems Patient Roles: Spouse, Parent (Has a wife, 2 daughters, 1 son.) Contact Information: Laurie Lovejoy - wife (c) [phone] Anticipated Caregiver: wife and self Ability/Limitations of Caregiver: Wife is retired, can provide some supervision. Caregiver Availability: 24/7 Discharge Plan Discussed with Primary Caregiver: Yes Is Caregiver In Agreement with Plan?: Yes Does Caregiver/Family have Issues with Lodging/Transportation while Pt is in Rehab?: No  Goals/Additional Needs Patient/Family Goal for Rehab: PT/OT mod I goals Expected length of stay: 5-7 days Cultural Considerations: None Dietary Needs: Regular diet, thin liquids Equipment Needs: TBD Pt/Family Agrees to Admission and willing to participate: Yes Program Orientation Provided & Reviewed with Pt/Caregiver Including Roles & Responsibilities: Yes  Decrease burden of Care through IP rehab admission: N/A  Possible need for SNF placement upon discharge: Not anticipated  Patient Condition: This patient's medical and functional status has changed since the consult dated: 03/30/15 in which the Rehabilitation Physician determined and documented that the patient's condition is appropriate for intensive rehabilitative care in an inpatient rehabilitation facility. See "History of Present Illness" (above) for medical update. Functional changes are: Currently requiring min assist to ambulate 50 ft RW. Patient's medical and functional status update has been discussed with the Rehabilitation physician and patient  remains appropriate for inpatient rehabilitation. Will admit to inpatient rehab tomorrow, Saturday.  Preadmission Screen Completed By: Retta Diones, 03/31/2015 1:47 PM ______________________________________________________________________  Discussed status with Dr. Letta Pate on 03/31/15 at 1345 and received telephone approval for admission tomorrow, Saturday.  Admission Coordinator: Retta Diones, time1346/Date08/19/16          Cosigned by: Charlett Blake, MD at 03/31/2015 4:34 PM  Revision History     Date/Time User Provider Type Action   03/31/2015 4:34 PM Charlett Blake, MD Physician Cosign   03/31/2015 1:47 PM Retta Diones, RN Rehab Admission Coordinator Sign

## 2015-04-03 NOTE — Progress Notes (Signed)
Occupational Therapy Session Note  Patient Details  Name: Cody Hodge MRN: [id number] Date of Birth: 1945/08/30  Today's Date: 04/03/2015 OT Individual Time: 0830-1000 and 1400-1500 OT Individual Time Calculation (min): 90 min and 60 min   Short Term Goals: Week 1:  OT Short Term Goal 1 (Week 1):  (STG= LTG)  Skilled Therapeutic Interventions/Progress Updates:    1) ADL retraining with focus on activity tolerance and pain management during self-care tasks.  Pt required increased time with all self-care tasks due to decreased knee ROM and multiple rest breaks however was setup to Mod I with all self-care tasks. Bathing and dressing completed at sit > stand level with increased time to reach feet, however not wanting to utilize AE to assist with LB dressing.  Grooming completed in standing at sink with pt reporting fatigue and soreness in knees after standing for approx 5 mins.  Requested to rest with BLE propped up in bed.  2) Treatment session with focus on functional mobility in home environment and knee exercises. Pt donned shoes with increased time without AE.  Ambulated to ADL apt, completed bed and couch transfers with education on seat preferences to increase independence with sit > stand.  Discussed 3 in 1 to place overtop of toilet upon d/c, as nowhere to push up from when on toilet - pt prefers elongated toilet seat ([place] aware).  Discussed possible d/c plan and pt goals as pt performing very well in sessions.  Completed 2 reps of 4 steps with close supervision going up and contact guard when descending steps.  Knee flexion exercises in supine with pt completing 3 reps of 15.    Therapy Documentation Precautions:  Precautions Precautions: Fall, Knee Required Braces or Orthoses:  (KI Discharged) Knee Immobilizer - Right: Discontinue once straight leg raise with < 10 degree lag Knee Immobilizer - Left: Discontinue once straight leg raise with < 10 degree lag Restrictions Weight  Bearing Restrictions: Yes RLE Weight Bearing: Weight bearing as tolerated LLE Weight Bearing: Weight bearing as tolerated Other Position/Activity Restrictions: WBAT General:   Vital Signs: Therapy Vitals Temp: 98.7 F (37.1 C) Temp Source: Oral Pulse Rate: 86 Resp: 18 BP: (!) 106/54 mmHg Patient Position (if appropriate): Lying Oxygen Therapy SpO2: 95 % O2 Device: Not Delivered Pain:   ADL:   Exercises:   Other Treatments:    Function:    Grooming Oral Care,Brush Teeth, Clean Dentures Activity:      Assist Level: More than reasonable amount of time      Wash, Rinse, Dry Face Activity   Assist Level: More than reasonable time      Wash, Rinse, Dry Hands Activity   Assist Level: More than reasonable time      Brush, Comb Hair Activity        Shave Activity   Assist Level: More than reasonable time;Set up   Set up : To obtain items  Apply Makeup Activity                                                             Bathing Bathing position   Position: Shower  Bathing parts Body parts bathed by patient: Right arm;Left arm;Chest;Abdomen;Front perineal area;Buttocks;Left upper leg;Right upper leg;Right lower leg;Left lower leg;Back    Bathing assist Assist Level: More than  reasonable time;Set up   Set up : To obtain items   Upper Body Dressing/Undressing Upper body dressing   What is the patient wearing?: Pull over shirt/dress     Pull over shirt/dress - Perfomed by patient: Thread/unthread right sleeve;Thread/unthread left sleeve;Put head through opening;Pull shirt over trunk          Upper body assist Assist Level: Set up   Set up : To obtain clothing/put away   Lower Body Dressing/Undressing Lower body dressing   What is the patient wearing?: Underwear;Pants;Shoes Underwear - Performed by patient: Thread/unthread right underwear leg;Thread/unthread left underwear leg;Pull underwear up/down   Pants- Performed by patient: Thread/unthread  right pants leg;Thread/unthread left pants leg;Pull pants up/down           Shoes - Performed by patient: Don/doff right shoe;Don/doff left shoe            Lower body assist Assist Level: Set up   Set up : To obtain clothing/put away    Bed Mobility Roll left and right activity   Assist level: No help, No cues, assistive device, takes more than a reasonable amount of time  Sit to lying activity   Assist level: No help, No cues, assistive device, takes more than a reasonable amount of time  Lying to sitting activity   Assist level: Supervision or verbal cues  Mobility details     Transfers Sit to stand transfer   Sit to stand assist level: Supervision or verbal cues Sit to stand assistive device: Walker  Chair/bed transfer   Chair/bed transfer method: Stand pivot;Ambulatory Chair/bed transfer assist level: Supervision or verbal cues Chair/bed transfer assistive device: Armrests;Walker      Copywriter, advertising assistive device: Shower chair;Walk in [employer] level into tub: Supervision or verbal cues Assist level out of tub: Set up only   Cognition Comprehension Comprehension assist level: Follows complex conversation/direction with extra time/assistive device  Expression Expression assist level: Expresses complex ideas: With extra time/assistive device  Social Interaction Social Interaction assist level: Interacts appropriately with others - No medications needed.  Problem Solving Problem solving assist level: Solves complex 90% of the time/cues < 10% of the time  Memory Memory assist level: More than reasonable amount of time    Therapy/Group: Individual Therapy  Simonne Come 04/03/2015, 9:53 AM

## 2015-04-03 NOTE — IPOC Note (Signed)
Overall Plan of Care [facility]) Patient Details Name: Cody Hodge MRN: [id number] DOB: 08/15/1945  Admitting Diagnosis: bilateral tkr  Hospital Problems: Active Problems:   OA (osteoarthritis) of knee     Functional Problem List: Nursing Bowel, Edema, Endurance, Medication Management, Motor, Skin Integrity, Safety  PT Balance, Endurance, Motor  OT Balance, Endurance, Motor, Pain, Safety  SLP    TR         Basic ADL's: OT Eating, Grooming, Bathing, Dressing, Toileting     Advanced  ADL's: OT Simple Meal Preparation     Transfers: PT Bed Mobility, Bed to Chair, Car  OT Toilet, Tub/Shower     Locomotion: PT Ambulation, Stairs     Additional Impairments: OT None  SLP        TR      Anticipated Outcomes Item Anticipated Outcome  Self Feeding independent  Swallowing      Basic self-care  mod I  Toileting  Mod I   Bathroom Transfers Mod I  Bowel/Bladder  Bowel and bladder with Mod I assist  Transfers  mod I transfers  Locomotion  mod I for gait, mod I for stairs  Communication     Cognition     Pain  Pain level  3 or less on a scale of 0-10.  Safety/Judgment  Safety/Judgement mod I assist   Therapy Plan: PT Intensity: Minimum of 1-2 x/day ,45 to 90 minutes PT Frequency: 5 out of 7 days PT Duration Estimated Length of Stay: 7 days  OT Intensity: Minimum of 1-2 x/day, 45 to 90 minutes OT Frequency: 5 out of 7 days OT Duration/Estimated Length of Stay: 7 days         Team Interventions: Nursing Interventions Patient/Family Education, Bowel Management, Pain Management, Medication Management, Skin Care/Wound Management, Discharge Planning  PT interventions Ambulation/gait training, Balance/vestibular training, Discharge planning, Community reintegration, DME/adaptive equipment instruction, Functional mobility training, Patient/family education, Pain management, Neuromuscular re-education, Psychosocial support, Therapeutic Exercise, Therapeutic  Activities, Stair training, UE/LE Strength taining/ROM, UE/LE Coordination activities  OT Interventions Training and development officer, Community reintegration, Discharge planning, DME/adaptive equipment instruction, Functional mobility training, Pain management, Patient/family education, Self Care/advanced ADL retraining, Splinting/orthotics, Therapeutic Activities, Therapeutic Exercise, UE/LE Strength taining/ROM  SLP Interventions    TR Interventions    SW/CM Interventions Discharge Planning, Psychosocial Support, Patient/Family Education    Team Discharge Planning: Destination: PT-Home ,OT- Home , SLP-  Projected Follow-up: PT-Home health PT, Outpatient PT, OT-  None, SLP-  Projected Equipment Needs: PT-To be determined, OT- None recommended by OT (Has borrowed 3n1 and shower seat ), SLP-  Equipment Details: PT- , OT-  Patient/family involved in discharge planning: PT- Patient,  OT-Patient, SLP-   MD ELOS: 7d Medical Rehab Prognosis:  Excellent Assessment: 69 yo male with end stage OA both knees underwent bilateral TKR on 03/29/2015.He is requiring physical assist for ADLs and mobility Now requiring 24/7 Rehab RN,MD, as well as CIR level PT, OT.  Treatment team will focus on ADLs and mobility with goals set at Mod I   See Team Conference Notes for weekly updates to the plan of care

## 2015-04-03 NOTE — Progress Notes (Signed)
Patient information reviewed and entered into eRehab system by Latona Krichbaum, RN, CRRN, PPS Coordinator.  Information including medical coding and functional independence measure will be reviewed and updated through discharge.     Per nursing patient was given "Data Collection Information Summary for Patients in Inpatient Rehabilitation Facilities with attached "Privacy Act Statement-Health Care Records" upon admission.  

## 2015-04-03 NOTE — Progress Notes (Signed)
Social Work Assessment and Plan Social Work Assessment and Plan  Patient Details  Name: Cody Hodge MRN: [id number] Date of Birth: 1946-05-11  Today's Date: 04/03/2015  Problem List:  Patient Active Problem List   Diagnosis Date Noted  . Postoperative anemia due to acute blood loss 04/03/2015  . Leucocytosis 04/03/2015  . OA (osteoarthritis) of knee 03/29/2015  . Abnormal myocardial perfusion study 10/06/2014  . Dyspnea on exertion 09/26/2014  . Family history of early CAD 09/26/2014  . HTN (hypertension)   . Mixed hyperlipidemia   . OSA (obstructive sleep apnea)   . PAF (paroxysmal atrial fibrillation)    Past Medical History:  Past Medical History  Diagnosis Date  . Tremor   . Knee arthropathy   . OSA (obstructive sleep apnea)     mild to moderate refused CPAP  . Mixed hyperlipidemia   . Hypercholesteremia   . Melanoma   . PAF (paroxysmal atrial fibrillation)     CHADS VASC score 2 (age>65 and HTN)  . Arthritis   . ED (erectile dysfunction)   . HTN (hypertension)   . BPH (benign prostatic hyperplasia)   . Melanoma   . Coronary artery disease 09/2014    50% mid to distal LAD, 50-70% ostial first diag, 40% ostial and mid left circ.  Marland Kitchen Hemorrhoids     associated with bowel movements   Past Surgical History:  Past Surgical History  Procedure Laterality Date  . Left and right arthroscopy    . Lasik    . Hydrocele excision / repair    . Left forearm melanoma    . Left heart catheterization with coronary angiogram N/A 10/06/2014    Procedure: LEFT HEART CATHETERIZATION WITH CORONARY ANGIOGRAM;  Surgeon: Sinclair Grooms, MD;  Location: [facility] CATH LAB;  Service: Cardiovascular;  Laterality: N/A;  . Cardiac catheterization      2'16- only ever heart cath  . Total knee arthroplasty Bilateral 03/29/2015    Procedure: TOTAL KNEE BILATERAL;  Surgeon: Gaynelle Arabian, MD;  Location: [facility] ORS;  Service: Orthopedics;  Laterality: Bilateral;  spinal anes. placed in OR   Social  History:  reports that he has never smoked. He does not have any smokeless tobacco history on file. He reports that he drinks alcohol. His drug history is not on file.  Family / Support Systems Marital Status: Married Patient Roles: Spouse, Parent, Other (Comment) (employee) Spouse/Significant Other: Gerald Stabs  [phone]  [phone] Children: Clarise Cruz Talbot-daughter  [phone] Other Supports: Another daughter and one son Anticipated Caregiver: Wife and patient Ability/Limitations of Caregiver: Wife can provide assist and pt is doing very well already Caregiver Availability: 24/7 Family Dynamics: Close knit family they have three children all are local and involved. Pt hired a Clinical research associate to prepare for this surgery 6 months prior and is doing well in his recovery.  Wife has been here to observe him in therapies  Social History Preferred language: English Religion: Non-Denominational Cultural Background: No issues Education: Conservation officer, historic buildings Read: Yes Write: Yes Employment Status: Employed Name of Employer: Chief Executive Officer Return to Work Plans: Plans to return when able Freight forwarder Issues: No issues Guardian/Conservator: None-according to MD pt is capable of making his own decisions while here   Abuse/Neglect Physical Abuse: Denies Verbal Abuse: Denies Sexual Abuse: Denies Exploitation of patient/patient's resources: Denies Self-Neglect: Denies  Emotional Status Pt's affect, behavior adn adjustment status: Pt is doing very well, he was well prepared and ready. He states: " It is hard to know how I'm doing  there is nothing to compare it too."  He has always been independent and active and wants to get back to this. This is his motivation. Recent Psychosocial Issues: healthy prior to admission Pyschiatric History: No history-more concerned regarding his wife and the memory issues he is seeing and unsure how to address this, due to wife has been defensive and angry when pointed out  to her. Pt is seeking why to address this, discussed family-daughter's talking with and or her PCP. Wanted to know id neuro-psych could talk with the both of them while here. Can ask the question. Substance Abuse History: No issues  Patient / Family Perceptions, Expectations & Goals Pt/Family understanding of illness & functional limitations: Pt is able to expalin his surgery and treatment plan. He has spoken with MD and feels his concerns are being addressed and feels it will be a short length of stay here. Premorbid pt/family roles/activities: Husband, father, Chief Executive Officer, church member, home owner, etc Anticipated changes in roles/activities/participation: resume Pt/family expectations/goals: Pt states: " I want to be able to take care of myself before I leave here."  [employer]: None Premorbid Home Care/DME Agencies: None Transportation available at discharge: Wife  Discharge Planning Living Arrangements: Spouse/significant other Support Systems: Spouse/significant other, Children, Water engineer, Social worker community Type of Residence: Private residence Insurance Resources: [employer], Multimedia programmer (specify) (mutual of [place]) Museum/gallery curator Resources: Employment Financial Screen Referred: No Living Expenses: Own Money Management: Spouse, Patient Does the patient have any problems obtaining your medications?: No Home Management: Wife Patient/Family Preliminary Plans: Return home with wife who can assist if needed. Pt will be at a mod/i level so will not need physical care at discharge.  Will ask wife to come in and attend therapies with pt to address any questions she may have. Will also assist with resources for pt's concern regarding wife's memory issues. Social Work Anticipated Follow Up Needs: HH/OP  Clinical Impression Pleasant motivated gentleman who is doing very well and will not be here long. Wife is available to assist if needed. Supportive  children and friends. Will provide information for pt regarding concerns he expressed regarding wife. Will work toward discharge plans.  [name] 04/03/2015, 2:51 PM

## 2015-04-03 NOTE — Progress Notes (Signed)
Charlett Blake, MD Physician Signed Physical Medicine and Rehabilitation Consult Note 03/30/2015 10:03 AM  Related encounter: Admission (Discharged) from 03/29/2015 in [place] Collapse All        Physical Medicine and Rehabilitation Consult  Reason for Consult: B-TKR due to OA bilateral knees Referring Physician: Dr. Wynelle Link.    HPI: Cody Hodge is a 69 y.o. male with history of HTN, CAD, OSA, bilaterl knee pain due to endstage OA. Patient elected to undergo B-TKR on 03/29/15 by Dr. Wynelle Link. Post op WBAT with CPM used for PROM. PT/OT evaluations to be done today. CIR consulted in anticipation of extensive rehab needs. Patient can see history of 25 or so pre-rehabilitation visits with a personal trainer prior to surgery. He is highly motivated to return to recreational tennis next spring He states he can to straight leg raising with both right and left legs yesterday however today he is unable to do a right straight leg raise.  Patient states his wife is unable to assist physically. We discussed other potential complications of surgery including ileus, urinary retention, and uncontrolled pain. He states his pain is well controlled at the current time with just Tylenol plus his epidural infusion. He feels like the epidural has affected his right lower extremity more today.   Review of Systems  Constitutional: Negative.  HENT: Negative.  Eyes: Negative.  Respiratory: Negative.  Cardiovascular: Negative.  Gastrointestinal: Negative.  Genitourinary: Negative.  Musculoskeletal: Positive for joint pain.  Skin: Negative.  Neurological: Negative.  Endo/Heme/Allergies: Negative.  Psychiatric/Behavioral: Negative.      Past Medical History  Diagnosis Date  . Tremor   . Knee arthropathy   . OSA (obstructive sleep apnea)     mild to moderate refused CPAP  . Mixed hyperlipidemia   .  Hypercholesteremia   . Melanoma   . PAF (paroxysmal atrial fibrillation)     CHADS VASC score 2 (age>65 and HTN)  . Arthritis   . ED (erectile dysfunction)   . HTN (hypertension)   . BPH (benign prostatic hyperplasia)   . Melanoma   . Coronary artery disease 09/2014    50% mid to distal LAD, 50-70% ostial first diag, 40% ostial and mid left circ.  Marland Kitchen Hemorrhoids     associated with bowel movements    Past Surgical History  Procedure Laterality Date  . Left and right arthroscopy    . Lasik    . Hydrocele excision / repair    . Left forearm melanoma    . Left heart catheterization with coronary angiogram N/A 10/06/2014    Procedure: LEFT HEART CATHETERIZATION WITH CORONARY ANGIOGRAM; Surgeon: Sinclair Grooms, MD; Location: [facility] CATH LAB; Service: Cardiovascular; Laterality: N/A;  . Cardiac catheterization      2'16- only ever heart cath   Family History  Problem Relation Age of Onset  . Dementia Mother   . Heart attack Father   . Heart disease Father     Social History:  reports that he has never smoked. He does not have any smokeless tobacco history on file. He reports that he drinks alcohol. His drug history is not on file.    Allergies: No Active Allergies    Medications Prior to Admission  Medication Sig Dispense Refill  . atorvastatin (LIPITOR) 20 MG tablet Take 20 mg by mouth daily.    . Coenzyme Q10 (COQ10) 100 MG CAPS Take 100 mg by mouth at bedtime.    Marland Kitchen  diltiazem (CARDIZEM CD) 120 MG 24 hr capsule Take 1 capsule (120 mg total) by mouth daily. 90 capsule 3  . finasteride (PROSCAR) 5 MG tablet Take 5 mg by mouth daily.     . flecainide (TAMBOCOR) 150 MG tablet Take 1 tablet (150 mg total) by mouth 2 (two) times daily. 180 tablet 2  . ibuprofen (ADVIL,MOTRIN) 200 MG tablet Take 400 mg by mouth 2 (two) times daily as needed (pain).    Marland Kitchen  OVER THE COUNTER MEDICATION Take 1 tablet by mouth 2 (two) times daily. grapeseed oil 75 mg/resveratrol 30 mg/ vitamin c 100 mg    . rivaroxaban (XARELTO) 20 MG TABS tablet Take 1 tablet (20 mg total) by mouth daily with supper. 30 tablet 6  . tamsulosin (FLOMAX) 0.4 MG CAPS capsule Take 0.4 mg by mouth at bedtime.     . triamcinolone (NASACORT) 55 MCG/ACT AERO nasal inhaler Place 1 spray into both nostrils at bedtime.    . TURMERIC PO Take 400 mg by mouth 2 (two) times daily.      Home: Home Living Family/patient expects to be discharged to:: Inpatient rehab Living Arrangements: Spouse/significant other  Functional History:   Functional Status:  Mobility:          ADL:    Cognition: Cognition Orientation Level: Oriented X4    Blood pressure 110/68, pulse 79, temperature 97.6 F (36.4 C), temperature source Oral, resp. rate 16, height 5\' 10"  (1.778 m), weight 87.998 kg (194 lb), SpO2 100 %. Physical Exam  Nursing note and vitals reviewed. Constitutional: He is oriented to person, place, and time. He appears well-developed and well-nourished.  HENT:  Head: Normocephalic and atraumatic.  Right Ear: External ear normal.  Left Ear: External ear normal.  Eyes: Conjunctivae and EOM are normal. Pupils are equal, round, and reactive to light.  Musculoskeletal:   Right knee: He exhibits decreased range of motion, swelling and effusion. He exhibits no deformity and normal alignment. Tenderness found.   Left knee: He exhibits decreased range of motion, swelling and effusion. Tenderness found.   Right ankle: Normal. No tenderness.   Left ankle: Normal. No tenderness.   Right lower leg: He exhibits swelling.   Left lower leg: He exhibits swelling.   Right foot: Normal.   Left foot: Normal.  Neurological: He is alert and oriented to person, place, and time.  Motor strength is normal bilateral upper extremities  Patient has  2 minus in the right hip flexor 3 minus knee extensor 4 minus ankle dorsiflexor 3/5 in the left hip flexor 4 minus left knee extensor and 4+ in the left ankle dorsiflexor   Psychiatric: He has a normal mood and affect. His behavior is normal. Judgment and thought content normal.     Lab Results Last 24 Hours    Results for orders placed or performed during the hospital encounter of 03/29/15 (from the past 24 hour(s))  CBC Status: Abnormal   Collection Time: 03/30/15 4:35 AM  Result Value Ref Range   WBC 14.6 (H) 4.0 - 10.5 K/uL   RBC 3.91 (L) 4.22 - 5.81 MIL/uL   Hemoglobin 10.8 (L) 13.0 - 17.0 g/dL   HCT 33.3 (L) 39.0 - 52.0 %   MCV 85.2 78.0 - 100.0 fL   MCH 27.6 26.0 - 34.0 pg   MCHC 32.4 30.0 - 36.0 g/dL   RDW 13.5 11.5 - 15.5 %   Platelets 259 150 - 400 K/uL  Basic metabolic panel Status: Abnormal   Collection  Time: 03/30/15 4:35 AM  Result Value Ref Range   Sodium 137 135 - 145 mmol/L   Potassium 3.8 3.5 - 5.1 mmol/L   Chloride 104 101 - 111 mmol/L   CO2 24 22 - 32 mmol/L   Glucose, Bld 150 (H) 65 - 99 mg/dL   BUN 16 6 - 20 mg/dL   Creatinine, Ser 0.96 0.61 - 1.24 mg/dL   Calcium 8.4 (L) 8.9 - 10.3 mg/dL   GFR calc non Af Amer >60 >60 mL/min   GFR calc Af Amer >60 >60 mL/min   Anion gap 9 5 - 15  Protime-INR Status: None   Collection Time: 03/30/15 4:35 AM  Result Value Ref Range   Prothrombin Time 15.1 11.6 - 15.2 seconds   INR 1.17 0.00 - 1.49      Imaging Results (Last 48 hours)    No results found.    Assessment/Plan: Diagnosis: Decline in functional abilities following total knee replacements postoperative day #1 1. Does the need for close, 24 hr/day medical supervision in concert with the patient's rehab needs make it unreasonable for this patient to be served in a less intensive setting? Yes 2. Co-Morbidities requiring  supervision/potential complications: Coronary artery disease, Paroxysmal atrial fibrillation, post operative anemia 3. Due to bladder management, bowel management, safety, skin/wound care, disease management, medication administration, pain management and patient education, does the patient require 24 hr/day rehab nursing? Yes 4. Does the patient require coordinated care of a physician, rehab nurse, PT (1-2 hrs/day, 5 days/week) and OT (1-2 hrs/day, 5 days/week) to address physical and functional deficits in the context of the above medical diagnosis(es)? Yes Addressing deficits in the following areas: balance, endurance, locomotion, strength, transferring, bowel/bladder control, bathing, dressing, feeding, grooming and toileting 5. Can the patient actively participate in an intensive therapy program of at least 3 hrs of therapy per day at least 5 days per week? Potentially 6. The potential for patient to make measurable gains while on inpatient rehab is excellent 7. Anticipated functional outcomes upon discharge from inpatient rehab are modified independent with PT, modified independent with OT, n/a with SLP. 8. Estimated rehab length of stay to reach the above functional goals is: 5-7 days 9. Does the patient have adequate social supports and living environment to accommodate these discharge functional goals? Yes 10. Anticipated D/C setting: Home 11. Anticipated post D/C treatments: [place] therapy 12. Overall Rehab/Functional Prognosis: excellent  RECOMMENDATIONS: This patient's condition is appropriate for continued rehabilitative care in the following setting: CIR Patient has agreed to participate in recommended program. Yes Note that insurance prior authorization may be required for reimbursement for recommended care.  Comment: If patient progresses to a supervision or modified independent level by postop day 3, would not recommend CIR.    03/30/2015       Revision History     Date/Time  User Provider Type Action   03/30/2015 3:18 PM Charlett Blake, MD Physician Sign   03/30/2015 10:04 AM Bary Leriche, PA-C Physician Assistant Pend   View Details Report       Routing History     Date/Time From To Method   03/30/2015 3:18 PM Charlett Blake, MD Charlett Blake, MD In [facility]   03/30/2015 3:18 PM Charlett Blake, MD L.Donnie Coffin, MD Fax

## 2015-04-03 NOTE — Progress Notes (Signed)
Subjective/Complaints: No dizziness in therapy Had a BM but not recorded yesterday  Review of Systems - +constip, neg knee pain, neg CP,or SOB  Objective: Vital Signs: Blood pressure 106/54, pulse 86, temperature 98.7 F (37.1 C), temperature source Oral, resp. rate 18, height $RemoveBe'5\' 10"'hBnoZLiVi$  (1.778 m), weight 90.855 kg (200 lb 4.8 oz), SpO2 95 %. No results found. Results for orders placed or performed during the hospital encounter of 04/01/15 (from the past 72 hour(s))  CBC WITH DIFFERENTIAL     Status: Abnormal   Collection Time: 04/01/15  4:45 PM  Result Value Ref Range   WBC 11.3 (H) 4.0 - 10.5 K/uL   RBC 3.00 (L) 4.22 - 5.81 MIL/uL   Hemoglobin 8.3 (L) 13.0 - 17.0 g/dL   HCT 25.1 (L) 39.0 - 52.0 %   MCV 83.7 78.0 - 100.0 fL   MCH 27.7 26.0 - 34.0 pg   MCHC 33.1 30.0 - 36.0 g/dL   RDW 13.7 11.5 - 15.5 %   Platelets 234 150 - 400 K/uL   Neutrophils Relative % 76 43 - 77 %   Neutro Abs 8.5 (H) 1.7 - 7.7 K/uL   Lymphocytes Relative 16 12 - 46 %   Lymphs Abs 1.8 0.7 - 4.0 K/uL   Monocytes Relative 8 3 - 12 %   Monocytes Absolute 0.9 0.1 - 1.0 K/uL   Eosinophils Relative 0 0 - 5 %   Eosinophils Absolute 0.1 0.0 - 0.7 K/uL   Basophils Relative 0 0 - 1 %   Basophils Absolute 0.0 0.0 - 0.1 K/uL  Comprehensive metabolic panel     Status: Abnormal   Collection Time: 04/01/15  4:45 PM  Result Value Ref Range   Sodium 137 135 - 145 mmol/L   Potassium 3.9 3.5 - 5.1 mmol/L   Chloride 100 (L) 101 - 111 mmol/L   CO2 31 22 - 32 mmol/L   Glucose, Bld 126 (H) 65 - 99 mg/dL   BUN 21 (H) 6 - 20 mg/dL   Creatinine, Ser 0.93 0.61 - 1.24 mg/dL   Calcium 8.6 (L) 8.9 - 10.3 mg/dL   Total Protein 5.4 (L) 6.5 - 8.1 g/dL   Albumin 2.7 (L) 3.5 - 5.0 g/dL   AST 29 15 - 41 U/L   ALT 22 17 - 63 U/L   Alkaline Phosphatase 64 38 - 126 U/L   Total Bilirubin 0.5 0.3 - 1.2 mg/dL   GFR calc non Af Amer >60 >60 mL/min   GFR calc Af Amer >60 >60 mL/min    Comment: (NOTE) The eGFR has been calculated  using the CKD EPI equation. This calculation has not been validated in all clinical situations. eGFR's persistently <60 mL/min signify possible Chronic Kidney Disease.    Anion gap 6 5 - 15  Urinalysis, Routine w reflex microscopic (not at [facility])     Status: None   Collection Time: 04/01/15  7:42 PM  Result Value Ref Range   Color, Urine YELLOW YELLOW   APPearance CLEAR CLEAR   Specific Gravity, Urine 1.021 1.005 - 1.030   pH 6.0 5.0 - 8.0   Glucose, UA NEGATIVE NEGATIVE mg/dL   Hgb urine dipstick NEGATIVE NEGATIVE   Bilirubin Urine NEGATIVE NEGATIVE   Ketones, ur NEGATIVE NEGATIVE mg/dL   Protein, ur NEGATIVE NEGATIVE mg/dL   Urobilinogen, UA 0.2 0.0 - 1.0 mg/dL   Nitrite NEGATIVE NEGATIVE   Leukocytes, UA NEGATIVE NEGATIVE    Comment: MICROSCOPIC NOT DONE ON URINES WITH NEGATIVE  PROTEIN, BLOOD, LEUKOCYTES, NITRITE, OR GLUCOSE <1000 mg/dL.  Urine culture     Status: None (Preliminary result)   Collection Time: 04/01/15  7:42 PM  Result Value Ref Range   Specimen Description URINE, CLEAN CATCH    Special Requests NONE    Culture NO GROWTH < 24 HOURS    Report Status PENDING   Protime-INR     Status: Abnormal   Collection Time: 04/02/15  5:43 AM  Result Value Ref Range   Prothrombin Time 21.1 (H) 11.6 - 15.2 seconds   INR 1.83 (H) 0.00 - 1.49  Protime-INR     Status: Abnormal   Collection Time: 04/03/15  5:17 AM  Result Value Ref Range   Prothrombin Time 22.3 (H) 11.6 - 15.2 seconds   INR 1.97 (H) 0.00 - 1.49     HEENT: normal Cardio: RRR and no murmur Resp: RRR and no murmur GI: BS positive and NT,ND Extremity:  BS positive and NT, ND Skin:   Wound C/D/I and steristrips Neuro: Alert/Oriented and Abnormal Motor 4/5 B HF, 3- KE, 3- KF, 4/5 B ADF Musc/Skel:  Extremity tender bilateral knees Gen NAD   Assessment/Plan: 1. Functional deficits secondary to Bilateral TKR for end stage OA which require 3+ hours per day of interdisciplinary therapy in a comprehensive  inpatient rehab setting. Physiatrist is providing close team supervision and 24 hour management of active medical problems listed below. Physiatrist and rehab team continue to assess barriers to discharge/monitor patient progress toward functional and medical goals. FIM: Function - Bathing Position: Shower Body parts bathed by patient: Right arm, Left arm, Chest, Abdomen, Front perineal area, Buttocks, Left upper leg, Right upper leg, Right lower leg, Left lower leg, Back Body parts bathed by helper: Right arm, Left arm, Chest, Abdomen, Front perineal area, Right upper leg, Left upper leg, Buttocks Assist Level: Touching or steadying assistance(Pt > 75%)  Function- Upper Body Dressing/Undressing What is the patient wearing?: Pull over shirt/dress Pull over shirt/dress - Perfomed by patient: Thread/unthread right sleeve, Thread/unthread left sleeve, Put head through opening, Pull shirt over trunk Assist Level: Set up Set up : To obtain clothing/put away Function - Lower Body Dressing/Undressing What is the patient wearing?: Pants, Socks Position: Sitting EOB Pants- Performed by patient: Thread/unthread right pants leg, Thread/unthread left pants leg, Pull pants up/down Socks - Performed by helper: Don/doff right sock, Don/doff left sock Assist Level: Touching or steadying assistance (Pt > 75%)  Function - Toileting Toileting steps completed by patient: Adjust clothing prior to toileting, Adjust clothing after toileting Toileting steps completed by helper: Performs perineal hygiene Toileting Assistive Devices: Grab bar or rail Assist level: Supervision or verbal cues  Function Midwife transfer assistive device: Grab bar Assist level to toilet: Supervision or verbal cues Assist level from toilet: Supervision or verbal cues  Function - Chair/bed transfer Chair/bed transfer method: Stand pivot, Ambulatory Chair/bed transfer assist level: Touching or steadying  assistance (Pt > 75%) Chair/bed transfer assistive device: Armrests, Walker Chair/bed transfer details: Verbal cues for sequencing, Verbal cues for precautions/safety  Function - Locomotion: Wheelchair Will patient use wheelchair at discharge?: No Type: Manual Max wheelchair distance: 150 Assist Level: Supervision or verbal cues Assist Level: Supervision or verbal cues Assist Level: Supervision or verbal cues Function - Locomotion: Ambulation Assistive device: Walker-rolling Max distance: 200 Assist level: Touching or steadying assistance (Pt > 75%) Assist level: Touching or steadying assistance (Pt > 75%) Assist level: Touching or steadying assistance (Pt > 75%) Assist level: Touching  or steadying assistance (Pt > 75%) Walk 10 feet on uneven surfaces activity did not occur: Safety/medical concerns  Function - Comprehension Comprehension: Auditory Comprehension assist level: Follows complex conversation/direction with extra time/assistive device  Function - Expression Expression: Verbal Expression assist level: Expresses complex ideas: With extra time/assistive device  Function - Social Interaction Social Interaction assist level: Interacts appropriately with others - No medications needed.  Function - Problem Solving Problem solving assist level: Solves complex 90% of the time/cues < 10% of the time  Function - Memory Memory assist level: More than reasonable amount of time Patient normally able to recall (first 3 days only): That he or she is in a hospital, Staff names and faces, Location of own room, Current season  Medical Problem List and Plan: 1. Functional deficits secondary to Decline in functional abilities following total knee replacements 2. DVT Prophylaxis/Anticoagulation: Pharmaceutical: Lovenox till coumadin therapeutic, INR up to 1.97 3. Pain Management: Continue prn oxycodone.  4. Mood: LCSW to follow for evaluation and support.  5. Neuropsych: This  patient is capable of making decisions on his own behalf. 6. Skin/Wound Care: Routine pressure relief measures. Maintain adequate nutrition and hydration status 7. Fluids/Electrolytes/Nutrition: Monitor I/O. Maintain adequate nutrition and hydration.  8. Leukocytosis: Monitor for signs of infection UA negative  9. Dehydration: Push po fluids. Check lytes in am.  10. Constipation: continue Miralax. Senna S11.  ABLA, Fe supplement,if  repeat CBC hgb<10   LOS (Days) 2 A FACE TO FACE EVALUATION WAS PERFORMED  KIRSTEINS,ANDREW E 04/03/2015, 8:45 AM

## 2015-04-03 NOTE — Care Management Note (Signed)
Inpatient [facility] Individual Statement of Services  Patient Name:  Cody Hodge  Date:  04/03/2015  Welcome to the [facility].  Our goal is to provide you with an individualized program based on your diagnosis and situation, designed to meet your specific needs.  With this comprehensive rehabilitation program, you will be expected to participate in at least 3 hours of rehabilitation therapies Monday-Friday, with modified therapy programming on the weekends.  Your rehabilitation program will include the following services:  Physical Therapy (PT), Occupational Therapy (OT), 24 hour per day rehabilitation nursing, Case Management (Social Worker), Rehabilitation Medicine, Nutrition Services and Pharmacy Services  Weekly team conferences will be held on Wednesday to discuss your progress.  Your Social Worker will talk with you frequently to get your input and to update you on team discussions.  Team conferences with you and your family in attendance may also be held.  Expected length of stay: 5-7 days  Overall anticipated outcome: mod/i level  Depending on your progress and recovery, your program may change. Your Social Worker will coordinate services and will keep you informed of any changes. Your Social Worker's name and contact numbers are listed  below.  The following services may also be recommended but are not provided by the [facility] will be made to provide these services after discharge if needed.  Arrangements include referral to agencies that provide these services.  Your insurance has been verified to be:  [place] primary doctor is:  Donnie Coffin  Pertinent information will be shared with your doctor and your insurance company.  Social Worker:  Ovidio Kin,  [place] or [phone]  Information discussed with and copy given to patient by: [name], 04/03/2015, 3:02 PM

## 2015-04-03 NOTE — Progress Notes (Signed)
Physical Therapy Session Note  Patient Details  Name: Cody Hodge MRN: [id number] Date of Birth: 1946/02/18  Today's Date: 04/03/2015 PT Individual Time: 0900-1000 PT Individual Time Calculation (min): 60 min   Short Term Goals: Week 1:  PT Short Term Goal 1 (Week 1): STGs equal LTGs  Skilled Therapeutic Interventions/Progress Updates:    Pt received supine in bed with handoff from OT after previous session. Supine>sit with no A needed. Pt performed donning of tennis shoes with supervision and increased time to perform due to difficulty reaching feet while seated on EOB. Gait to therapy gym x100' with supervision and RW. Performed Nustep to improve BLE knee ROM, x10 min with gradual increase in ROM by moving chair forward 1 level at a time (started at seat on 15, finished at 13). LE strengthening/ROM exercises performed on mat table including quad sets, short arc quad, hamstring curl with maximove to reduce friction, and seated long arc quad. Pt returned to room with ambulation as above. Sit >supine in bed with supervision. Remained supine in bed with all needs within reach at completion of session.   Therapy Documentation Precautions:  Precautions Precautions: Fall, Knee Required Braces or Orthoses:  (KI Discharged) Knee Immobilizer - Right: Discontinue once straight leg raise with < 10 degree lag Knee Immobilizer - Left: Discontinue once straight leg raise with < 10 degree lag Restrictions Weight Bearing Restrictions: Yes RLE Weight Bearing: Weight bearing as tolerated LLE Weight Bearing: Weight bearing as tolerated Other Position/Activity Restrictions: WBAT Pain: Pain Assessment Pain Assessment: No/denies pain Pain Score: 0-No pain   Function:    Bed Mobility Roll left and right activity   Assist level: No help, No cues, assistive device, takes more than a reasonable amount of time  Sit to lying activity   Assist level: No help, No cues, assistive device, takes more than  a reasonable amount of time  Lying to sitting activity   Assist level: Supervision or verbal cues  Mobility details     Transfers Sit to stand transfer   Sit to stand assist level: Supervision or verbal cues Sit to stand assistive device: Walker  Chair/bed transfer   Chair/bed transfer method: Stand pivot;Ambulatory Chair/bed transfer assist level: Supervision or verbal cues Chair/bed transfer assistive device: Armrests;Walker       Marine scientist     Max distance: 200 Assist level: Touching or steadying assistance (Pt > 75%)  Walk 10 feet activity   Assist level: Touching or steadying assistance (Pt > 75%)  Walk 50 feet with 2 turns activity   Assist level: Touching or steadying assistance (Pt > 75%)  Walk 150 feet activity   Assist level: Touching or steadying assistance (Pt > 75%)  Walk 10 feet on uneven surfaces activity      Stairs          Walk up/down 1 step activity        Walk up/down 4 steps activity      Walk up/down 12 steps activity      Pick up small objects from floor      Wheelchair          Wheel 50 feet with 2 turns activity      Wheel 150 feet activity        Therapy/Group: Individual Therapy  Luberta Mutter 04/03/2015, 12:17 PM

## 2015-04-04 ENCOUNTER — Inpatient Hospital Stay (HOSPITAL_COMMUNITY): Payer: Medicare Other | Admitting: Occupational Therapy

## 2015-04-04 ENCOUNTER — Inpatient Hospital Stay (HOSPITAL_COMMUNITY): Payer: No Typology Code available for payment source | Admitting: Physical Therapy

## 2015-04-04 DIAGNOSIS — D62 Acute posthemorrhagic anemia: Secondary | ICD-10-CM

## 2015-04-04 LAB — PROTIME-INR
INR: 1.8 — ABNORMAL HIGH (ref 0.00–1.49)
PROTHROMBIN TIME: 20.9 s — AB (ref 11.6–15.2)

## 2015-04-04 MED ORDER — WARFARIN SODIUM 4 MG PO TABS
4.0000 mg | ORAL_TABLET | Freq: Once | ORAL | Status: AC
  Administered 2015-04-04: 4 mg via ORAL
  Filled 2015-04-04: qty 1

## 2015-04-04 MED ORDER — OXYCODONE HCL ER 10 MG PO T12A
10.0000 mg | EXTENDED_RELEASE_TABLET | Freq: Every day | ORAL | Status: DC
  Administered 2015-04-04 – 2015-04-05 (×2): 10 mg via ORAL
  Filled 2015-04-04 (×2): qty 1

## 2015-04-04 NOTE — Progress Notes (Signed)
Subjective/Complaints: No current pain c/os, mainly notes moderate pain during ROM of knee Has 2-3/10pain with first few steps followed by 1/10 pain when he is moving  Review of Systems - +constip, neg knee pain, neg CP,or SOB  Objective: Vital Signs: Blood pressure 121/62, pulse 81, temperature 98.6 F (37 C), temperature source Oral, resp. rate 18, height _0  (1.778 m), weight 90.855 kg (200 lb 4.8 oz), SpO2 96 %. No results found. Results for orders placed or performed during the hospital encounter of 04/01/15 (from the past 72 hour(s))  CBC WITH DIFFERENTIAL     Status: Abnormal   Collection Time: 04/01/15  4:45 PM  Result Value Ref Range   WBC 11.3 (H) 4.0 - 10.5 K/uL   RBC 3.00 (L) 4.22 - 5.81 MIL/uL   Hemoglobin 8.3 (L) 13.0 - 17.0 g/dL   HCT 25.1 (L) 39.0 - 52.0 %   MCV 83.7 78.0 - 100.0 fL   MCH 27.7 26.0 - 34.0 pg   MCHC 33.1 30.0 - 36.0 g/dL   RDW 13.7 11.5 - 15.5 %   Platelets 234 150 - 400 K/uL   Neutrophils Relative % 76 43 - 77 %   Neutro Abs 8.5 (H) 1.7 - 7.7 K/uL   Lymphocytes Relative 16 12 - 46 %   Lymphs Abs 1.8 0.7 - 4.0 K/uL   Monocytes Relative 8 3 - 12 %   Monocytes Absolute 0.9 0.1 - 1.0 K/uL   Eosinophils Relative 0 0 - 5 %   Eosinophils Absolute 0.1 0.0 - 0.7 K/uL   Basophils Relative 0 0 - 1 %   Basophils Absolute 0.0 0.0 - 0.1 K/uL  Comprehensive metabolic panel     Status: Abnormal   Collection Time: 04/01/15  4:45 PM  Result Value Ref Range   Sodium 137 135 - 145 mmol/L   Potassium 3.9 3.5 - 5.1 mmol/L   Chloride 100 (L) 101 - 111 mmol/L   CO2 31 22 - 32 mmol/L   Glucose, Bld 126 (H) 65 - 99 mg/dL   BUN 21 (H) 6 - 20 mg/dL   Creatinine, Ser 0.93 0.61 - 1.24 mg/dL   Calcium 8.6 (L) 8.9 - 10.3 mg/dL   Total Protein 5.4 (L) 6.5 - 8.1 g/dL   Albumin 2.7 (L) 3.5 - 5.0 g/dL   AST 29 15 - 41 U/L   ALT 22 17 - 63 U/L   Alkaline Phosphatase 64 38 - 126 U/L   Total Bilirubin 0.5 0.3 - 1.2 mg/dL   GFR calc non Af Amer >60 >60 mL/min   GFR  calc Af Amer >60 >60 mL/min    Comment: (NOTE) The eGFR has been calculated using the CKD EPI equation. This calculation has not been validated in all clinical situations. eGFR's persistently <60 mL/min signify possible Chronic Kidney Disease.    Anion gap 6 5 - 15  Urinalysis, Routine w reflex microscopic (not at [facility])     Status: None   Collection Time: 04/01/15  7:42 PM  Result Value Ref Range   Color, Urine YELLOW YELLOW   APPearance CLEAR CLEAR   Specific Gravity, Urine 1.021 1.005 - 1.030   pH 6.0 5.0 - 8.0   Glucose, UA NEGATIVE NEGATIVE mg/dL   Hgb urine dipstick NEGATIVE NEGATIVE   Bilirubin Urine NEGATIVE NEGATIVE   Ketones, ur NEGATIVE NEGATIVE mg/dL   Protein, ur NEGATIVE NEGATIVE mg/dL   Urobilinogen, UA 0.2 0.0 - 1.0 mg/dL   Nitrite NEGATIVE NEGATIVE  Leukocytes, UA NEGATIVE NEGATIVE    Comment: MICROSCOPIC NOT DONE ON URINES WITH NEGATIVE PROTEIN, BLOOD, LEUKOCYTES, NITRITE, OR GLUCOSE <1000 mg/dL.  Urine culture     Status: None   Collection Time: 04/01/15  7:42 PM  Result Value Ref Range   Specimen Description URINE, CLEAN CATCH    Special Requests NONE    Culture NO GROWTH 2 DAYS    Report Status 04/03/2015 FINAL   Protime-INR     Status: Abnormal   Collection Time: 04/02/15  5:43 AM  Result Value Ref Range   Prothrombin Time 21.1 (H) 11.6 - 15.2 seconds   INR 1.83 (H) 0.00 - 1.49  Protime-INR     Status: Abnormal   Collection Time: 04/03/15  5:17 AM  Result Value Ref Range   Prothrombin Time 22.3 (H) 11.6 - 15.2 seconds   INR 1.97 (H) 0.00 - 1.49  CBC     Status: Abnormal   Collection Time: 04/03/15  9:51 AM  Result Value Ref Range   WBC 10.7 (H) 4.0 - 10.5 K/uL   RBC 3.12 (L) 4.22 - 5.81 MIL/uL   Hemoglobin 8.8 (L) 13.0 - 17.0 g/dL   HCT 26.3 (L) 39.0 - 52.0 %   MCV 84.3 78.0 - 100.0 fL   MCH 28.2 26.0 - 34.0 pg   MCHC 33.5 30.0 - 36.0 g/dL   RDW 13.5 11.5 - 15.5 %   Platelets 328 150 - 400 K/uL  Protime-INR     Status: Abnormal   Collection  Time: 04/04/15  6:45 AM  Result Value Ref Range   Prothrombin Time 20.9 (H) 11.6 - 15.2 seconds   INR 1.80 (H) 0.00 - 1.49     HEENT: normal Cardio: RRR and no murmur Resp: RRR and no murmur GI: BS positive and NT,ND Extremity:  BS positive and NT, ND Skin:   Wound C/D/I and steristrips Neuro: Alert/Oriented and Abnormal Motor 4/5 B HF, 4 KE, 3- KF, 4/5 B ADF Musc/Skel:  Extremity tender bilateral knees, flexion to 90deg actively Gen NAD   Assessment/Plan: 1. Functional deficits secondary to Bilateral TKR for end stage OA which require 3+ hours per day of interdisciplinary therapy in a comprehensive inpatient rehab setting. Physiatrist is providing close team supervision and 24 hour management of active medical problems listed below. Physiatrist and rehab team continue to assess barriers to discharge/monitor patient progress toward functional and medical goals. FIM: Function - Bathing Position: Shower Body parts bathed by patient: Right arm, Left arm, Chest, Abdomen, Front perineal area, Buttocks, Left upper leg, Right upper leg, Right lower leg, Left lower leg, Back Body parts bathed by helper: Right arm, Left arm, Chest, Abdomen, Front perineal area, Right upper leg, Left upper leg, Buttocks Assist Level: More than reasonable time, Set up Set up : To obtain items  Function- Upper Body Dressing/Undressing What is the patient wearing?: Pull over shirt/dress Pull over shirt/dress - Perfomed by patient: Thread/unthread right sleeve, Thread/unthread left sleeve, Put head through opening, Pull shirt over trunk Assist Level: Set up Set up : To obtain clothing/put away Function - Lower Body Dressing/Undressing What is the patient wearing?: Underwear, Pants, Shoes Position: Sitting EOB Underwear - Performed by patient: Thread/unthread right underwear leg, Thread/unthread left underwear leg, Pull underwear up/down Pants- Performed by patient: Thread/unthread right pants leg,  Thread/unthread left pants leg, Pull pants up/down Socks - Performed by helper: Don/doff right sock, Don/doff left sock Shoes - Performed by patient: Don/doff right shoe, Don/doff left shoe Assist Level: Set  up Set up : To obtain clothing/put away  Function - Toileting Toileting steps completed by patient: Adjust clothing prior to toileting, Adjust clothing after toileting Toileting steps completed by helper: Performs perineal hygiene Toileting Assistive Devices: Grab bar or rail Assist level: Supervision or verbal cues  Function - Toilet Transfers Toilet transfer assistive device: Elevated toilet seat/BSC over toilet, Walker Assist level to toilet: Supervision or verbal cues Assist level from toilet: Supervision or verbal cues  Function - Chair/bed transfer Chair/bed transfer method: Stand pivot, Ambulatory Chair/bed transfer assist level: Supervision or verbal cues Chair/bed transfer assistive device: Armrests, Walker Chair/bed transfer details: Verbal cues for sequencing, Verbal cues for precautions/safety  Function - Locomotion: Wheelchair Will patient use wheelchair at discharge?: No Type: Manual Max wheelchair distance: 150 Assist Level: Supervision or verbal cues Assist Level: Supervision or verbal cues Assist Level: Supervision or verbal cues Function - Locomotion: Ambulation Assistive device: Walker-rolling Max distance: 200 Assist level: Touching or steadying assistance (Pt > 75%) Assist level: Touching or steadying assistance (Pt > 75%) Assist level: Touching or steadying assistance (Pt > 75%) Assist level: Touching or steadying assistance (Pt > 75%) Walk 10 feet on uneven surfaces activity did not occur: Safety/medical concerns  Function - Comprehension Comprehension: Auditory Comprehension assist level: Follows complex conversation/direction with extra time/assistive device  Function - Expression Expression: Verbal Expression assist level: Expresses complex  ideas: With extra time/assistive device  Function - Social Interaction Social Interaction assist level: Interacts appropriately with others - No medications needed.  Function - Problem Solving Problem solving assist level: Solves complex 90% of the time/cues < 10% of the time  Function - Memory Memory assist level: More than reasonable amount of time Patient normally able to recall (first 3 days only): That he or she is in a hospital, Staff names and faces, Location of own room, Current season  Medical Problem List and Plan: 1. Functional deficits secondary to Decline in functional abilities following total knee replacements 2. DVT Prophylaxis/Anticoagulation: Pharmaceutical: Lovenox till coumadin therapeutic, INR  1.97> 1.8 , will stay on lovenox for now      3. Pain Management: Continue prn oxycodone.  4. Mood: LCSW to follow for evaluation and support.  5. Neuropsych: This patient is capable of making decisions on his own behalf. 6. Skin/Wound Care: Routine pressure relief measures. Maintain adequate nutrition and hydration status 7. Fluids/Electrolytes/Nutrition: Monitor I/O. Maintain adequate nutrition and hydration.  8. Leukocytosis: Monitor for signs of infection UA negative  9. Dehydration: Push po fluids. Check lytes in am.  10. Constipation: continue Miralax. Senna S           11.  ABLA asymptomatic , Fe supplement rec but pt concerned about increased constipation repeat  hgb 8.8 which is trending up   11.  Pain management- long discussion- went over long acting vs short acting oxycodone, will start QHS dose while in hospital.  Pt to work with RN staff for optimal timing3 A FACE TO [place] E 04/04/2015, 8:18 AM

## 2015-04-04 NOTE — Progress Notes (Signed)
Social Work Patient ID: Cody Hodge, male   DOB: May 03, 1946, 69 y.o.   MRN: [id number] Met with pt to give resources for elongated commodes since this worker can not locate one in [place]. Encouraged his wife to go to the retail store of [facility] to look at the risors that can Be locked onto existing commode. Also addressed concerns regarding neuro-psychologist seeing he and his wife due to his concerns about his wife's memory issues. They are here to see the pt he is our Primary focus and can get recommendations or resources for him to pursue with his wife at discharge if he wanted.  He understood but thought he would ask. Work toward discharge on Thursday

## 2015-04-04 NOTE — Progress Notes (Signed)
Physical Therapy Session Note  Patient Details  Name: Cody Hodge MRN: [id number] Date of Birth: 29-Jan-1946  Today's Date: 04/04/2015 PT Individual Time: 0900-1000 PT Individual Time Calculation (min): 60 min   Short Term Goals: Week 1:  PT Short Term Goal 1 (Week 1): STGs equal LTGs  Skilled Therapeutic Interventions/Progress Updates:    Pt received in bed with wife present; c/o pain as described below and agreeable to treatment. Gait training with RW and supervision x300'. Required seated rest break following activity. Stair training for two trials of 8 stairs with supervision, BUEs on handrails and pt noting majority of eccentric control during descent coming from UEs. Nustep x12 min on level 3 with gradual increase in seat proximity to increase LE ROM. Supine LE exercises including quad sets, short arc quad, and heel slides. Pt given handout with supine exercises and educated in progression of exercises as tolerated. Pt ambulated back to room; remained supine in bed at completion of session and ice packs donned on B knees, all needs within reach.  Therapy Documentation Precautions:  Precautions Precautions: Fall, Knee Required Braces or Orthoses:  (KI Discharged) Knee Immobilizer - Right: Discontinue once straight leg raise with < 10 degree lag Knee Immobilizer - Left: Discontinue once straight leg raise with < 10 degree lag Restrictions Weight Bearing Restrictions: Yes RLE Weight Bearing: Weight bearing as tolerated LLE Weight Bearing: Weight bearing as tolerated Other Position/Activity Restrictions: WBAT Pain: Pain Assessment Pain Assessment: 0-10 Pain Score: 1  Pain Type: Surgical pain Pain Location: Knee Pain Orientation: Right Pain Descriptors / Indicators: Aching;Dull Pain Onset: Gradual Patients Stated Pain Goal: 0 Pain Intervention(s): Ambulation/increased activity;Repositioned;Cold applied Multiple Pain Sites: No   Function:    Bed Mobility Roll left and  right activity        Sit to lying activity        Lying to sitting activity   Assist level: Supervision or verbal cues Lying to sitting assistive device: Bedrails  Mobility details Bed mobility details: Visual cues/gestures for precautions/safety;Verbal cues for techniques   Transfers Sit to stand transfer   Sit to stand assist level: Supervision or verbal cues Sit to stand assistive device: Walker  Chair/bed transfer   Chair/bed transfer method: Stand pivot;Ambulatory Chair/bed transfer assist level: Supervision or verbal cues Chair/bed transfer assistive device: Armrests;Walker   Chair/bed transfer details: Verbal cues for sequencing;Verbal cues for Electrical engineer device: Walker-rolling Max distance: 300 Assist level: Supervision or verbal cues  Walk 10 feet activity   Assist level: Supervision or verbal cues  Walk 50 feet with 2 turns activity   Assist level: Supervision or verbal cues  Walk 150 feet activity   Assist level: Supervision or verbal cues  Walk 10 feet on uneven surfaces activity      Stairs   Stairs assistive device: 2 hand rails Max number of stairs: 8 Stairs assist level: Supervision or verbal cues  Walk up/down 1 step activity     Walk up/down 1 step (curb) assist level: Supervision or verbal cues  Walk up/down 4 steps activity   Walk up/down 4 steps assist level: Supervision or verbal cues  Walk up/down 12 steps activity   Walk up/down 12 steps assist level: Supervision or verbal cues  Pick up small objects from floor  Wheelchair          Wheel 50 feet with 2 turns activity      Wheel 150 feet activity          Therapy/Group: Individual Therapy  Luberta Mutter 04/04/2015, 12:11 PM

## 2015-04-04 NOTE — Patient Instructions (Addendum)
Strengthening: Quadriceps Set   Tighten muscles on top of thighs by pushing knees down into surface. Hold ____ seconds. Repeat ____ times per set. Do ____ sets per session. Do ____ sessions per day.  http://orth.exer.us/602   Copyright  VHI. All rights reserved.  Strengthening: Terminal Knee Extension (Supine)   With right knee over bolster, straighten knee by tightening muscles on top of thigh. Keep bottom of knee on bolster. Repeat ____ times per set. Do ____ sets per session. Do ____ sessions per day.  http://orth.exer.us/626   Copyright  VHI. All rights reserved.  Gluteal Sets   Tighten buttocks while pressing pelvis to floor. Hold ____ seconds. Repeat ____ times per set. Do ____ sets per session. Do ____ sessions per day.  http://orth.exer.us/104   Copyright  VHI. All rights reserved.  Strengthening: Hamstring Set   With left foot turned in, tighten muscles on back of thigh by pulling heel down into surface. Hold ____ seconds. Repeat ____ times per set. Do ____ sets per session. Do ____ sessions per day.  http://orth.exer.us/604   Copyright  VHI. All rights reserved.  Strengthening: Hip Adduction - Isometric   With ball or folded pillow between knees, squeeze knees together. Hold ____ seconds. Repeat ____ times per set. Do ____ sets per session. Do ____ sessions per day.  http://orth.exer.us/612   Copyright  VHI. All rights reserved.  Knee Extension (Sitting)   Place ____ pound weight on left ankle and straighten knee fully, lower slowly. Repeat ____ times per set. Do ____ sets per session. Do ____ sessions per day.  http://orth.exer.us/732   Copyright  VHI. All rights reserved.  Strengthening: Straight Leg Raise (Phase 1)   Tighten muscles on front of right thigh, then lift leg ____ inches from surface, keeping knee locked.  Repeat ____ times per set. Do ____ sets per session. Do ____ sessions per day.  http://orth.exer.us/614   Copyright   VHI. All rights reserved.

## 2015-04-04 NOTE — Progress Notes (Signed)
Orthopedic Tech Progress Note Patient Details:  Cody Hodge [birth year] [id number] Took off cpm at 6:10 pm Patient ID: Cody Hodge, male   DOB: 1945-11-21, 69 y.o.   MRN: [id number]   [name] 04/04/2015, 6:15 PM

## 2015-04-04 NOTE — Progress Notes (Addendum)
ANTICOAGULATION CONSULT NOTE - Follow Up Consult  Pharmacy Consult for coumadin Indication: VTE prophylaxis  No Known Allergies  Patient Measurements: Height: 5\' 10"  (177.8 cm) Weight: 200 lb 4.8 oz (90.855 kg) IBW/kg (Calculated) : 73 Heparin Dosing Weight:   Vital Signs: Temp: 98.6 F (37 C) (08/23 0650) Temp Source: Oral (08/23 0650) BP: 121/62 mmHg (08/23 0650) Pulse Rate: 81 (08/23 0650)  Labs:  Recent Labs  04/01/15 1645 04/02/15 0543 04/03/15 0517 04/03/15 0951 04/04/15 0645  HGB 8.3*  --   --  8.8*  --   HCT 25.1*  --   --  26.3*  --   PLT 234  --   --  328  --   LABPROT  --  21.1* 22.3*  --  20.9*  INR  --  1.83* 1.97*  --  1.80*  CREATININE 0.93  --   --   --   --     Estimated Creatinine Clearance: 85 mL/min (by C-G formula based on Cr of 0.93).   Medications:  Scheduled:  . atorvastatin  20 mg Oral Daily  . diltiazem  120 mg Oral Daily  . enoxaparin (LOVENOX) injection  30 mg Subcutaneous Q12H  . finasteride  5 mg Oral Daily  . flecainide  100 mg Oral BID  . methocarbamol  500 mg Oral QHS,MR X 1  . polyethylene glycol  17 g Oral Daily  . senna-docusate  2 tablet Oral BID  . tamsulosin  0.4 mg Oral QHS  . triamcinolone  1 spray Each Nare QHS  . warfarin   Does not apply Once  . Warfarin - Pharmacist Dosing Inpatient   Does not apply q1800   Infusions:    Assessment: 69 yo male is currently on subtherapeutic coumadin for VTE prophylaxis.  INR today somehow went down to 1.8 from 1.97 (dose given last night)  Goal of Therapy:  INR 2-2.5 Monitor platelets by anticoagulation protocol: Yes   Plan:  Warfarin 4 mg po x 1 tonight Enoxaparin 30mg  SQ q12h until INR >=2 Daily PT/INR Monitor for bleeding  Shannen Vernon, Tsz-Yin 04/04/2015,8:07 AM

## 2015-04-04 NOTE — Progress Notes (Signed)
Occupational Therapy Session Note  Patient Details  Name: Cody Hodge MRN: [id number] Date of Birth: 02-Jul-1946  Today's Date: 04/04/2015 OT Individual Time: 1100-1200 and 8850-2774 OT Individual Time Calculation (min): 60 min and 90 min   Short Term Goals: Week 1:  OT Short Term Goal 1 (Week 1):  (STG= LTG)  Skilled Therapeutic Interventions/Progress Updates:    1) Treatment session with focus on activity tolerance and functional mobility.  Pt ambulated to/from toilet and completed toileting tasks at overall Modified independent level.  Donned shoes with increased time and no assist.  Therapeutic activity incorporating Wii tennis into session with focus on weight shifting and standing tolerance during task incorporating his hobbies.  Pt required 2 seated rest breaks due to "burning" in knees, still only reporting pain at a 4.  Ambulated > 300 feet with RW over thresholds and up/down ramp to simulate community mobility as pt ambulates from home to office.  Pt left semi-reclined in bed at end of session with RN present to provide pain meds.  2) Engaged in therapeutic activity with focus on activity tolerance and functional mobility.  Engaged in simulated simple meal prep with pt retrieving items from various surfaces, including refrigerator and microwave.  Pt demonstrated appropriate safety awareness in kitchen, discussed problem solving with transporting items while maintaining safety within RW.  Engaged in 8 mins on Nustep level 3 with focus on endurance and knee flexion.  Returned to room where pt required seated rest break prior to completing bathing and dressing.  Pt Mod I overall with mobility and self-care tasks.  Pt reports increased soreness in knees at end of session, discussed pain management strategies and medication scheduling to decrease pain and increase activity tolerance.  Therapy Documentation Precautions:  Precautions Precautions: Fall, Knee Required Braces or Orthoses:   (KI Discharged) Knee Immobilizer - Right: Discontinue once straight leg raise with < 10 degree lag Knee Immobilizer - Left: Discontinue once straight leg raise with < 10 degree lag Restrictions Weight Bearing Restrictions: Yes RLE Weight Bearing: Weight bearing as tolerated LLE Weight Bearing: Weight bearing as tolerated Other Position/Activity Restrictions: WBAT Pain: Pain Assessment Pain Assessment: 0-10 Pain Score: 1  Pain Type: Surgical pain Pain Location: Knee Pain Orientation: Right Pain Descriptors / Indicators: Aching;Dull Pain Onset: Gradual Patients Stated Pain Goal: 0 Pain Intervention(s): Ambulation/increased activity;Repositioned;Cold applied Multiple Pain Sites: No  Function:   Eating Eating   Eating Assist Level: No help, No cues           Grooming Oral Care,Brush Teeth, Clean Dentures Activity:             Wash, Rinse, Dry Face Activity   Assist Level: More than reasonable time      Wash, Rinse, Dry Hands Activity   Assist Level: More than reasonable time      Brush, Comb Hair Activity        Shave Activity          Apply Makeup Activity                                                             Bathing Bathing position   Position: Shower  Bathing parts Body parts bathed by patient: Right arm;Left arm;Chest;Abdomen;Front perineal area;Buttocks;Left upper leg;Right upper leg;Right lower leg;Left lower leg;Back  Bathing assist Assist Level: More than reasonable time       Upper Body Dressing/Undressing Upper body dressing   What is the patient wearing?: Pull over shirt/dress     Pull over shirt/dress - Perfomed by patient: Thread/unthread right sleeve;Thread/unthread left sleeve;Put head through opening;Pull shirt over trunk          Upper body assist Assist Level: More than reasonable time   Set up : To obtain clothing/put away   Lower Body Dressing/Undressing Lower body dressing   What is the patient wearing?:  Underwear;Pants;Shoes;Socks Underwear - Performed by patient: Thread/unthread right underwear leg;Thread/unthread left underwear leg;Pull underwear up/down   Pants- Performed by patient: Thread/unthread right pants leg;Thread/unthread left pants leg;Pull pants up/down       Socks - Performed by patient: Don/doff right sock;Don/doff left sock   Shoes - Performed by patient: Don/doff right shoe;Don/doff left shoe;Fasten right;Fasten left            Lower body assist Assist Level: More than reasonable time       Toileting Toileting   Toileting steps completed by patient: Adjust clothing prior to toileting;Adjust clothing after toileting;Performs perineal hygiene   Toileting Assistive Devices: Grab bar or rail  Toileting assist Assist level: No help/no cues    Bed Mobility Roll left and right activity   Assist level: No help, No cues, assistive device, takes more than a reasonable amount of time  Sit to lying activity   Assist level: No help, No cues, assistive device, takes more than a reasonable amount of time  Lying to sitting activity   Assist level: No help, No cues, assistive device, takes more than a reasonable amount of time  Mobility details Bed mobility details: Visual cues/gestures for precautions/safety;Verbal cues for techniques   Transfers Sit to stand transfer   Sit to stand assist level: No help, no cues, assistive device, takes more than a reasonable amount of time Sit to stand assistive device: Walker  Chair/bed transfer   Chair/bed transfer method: Stand pivot;Ambulatory Chair/bed transfer assist level: No Help, no cues, assistive device, takes more than a reasonable amount of time Chair/bed transfer assistive device: Walker   Chair/bed transfer details: Verbal cues for sequencing;Verbal cues for precautions/safety  Toilet transfer   Toilet transfer assistive device: Elevated toilet seat/BSC over toilet;Walker       Assist level to toilet: No Help, no  cues, assistive device, takes more than a reasonable amount of time Assist level from toilet: No Help, no cues, assistive device, takes more than a reasonable amount of time  Tub/shower transfer   Tub/shower assistive device: Shower chair;Walk in shower;Walker   Assist level into tub: No Help, no cues, assistive device, takes more than a reasonable amount of time Assist level out of tub: No Help, no cues, assistive device, takes more than a reasonable amount of time   Cognition Comprehension Comprehension assist level: Follows complex conversation/direction with no assist  Expression Expression assist level: Expresses complex ideas: With extra time/assistive device  Social Interaction Social Interaction assist level: Interacts appropriately with others - No medications needed.  Problem Solving Problem solving assist level: Solves complex 90% of the time/cues < 10% of the time  Memory Memory assist level: More than reasonable amount of time    Therapy/Group: Individual Therapy  Simonne Come 04/04/2015, 4:03 PM

## 2015-04-04 NOTE — Progress Notes (Signed)
Orthopedic Tech Progress Note Patient Details:  Cody Hodge 08/18/1945 [id number] On cpm at 4:10 pm Patient ID: Cody Hodge, male   DOB: Jan 24, 1946, 69 y.o.   MRN: [id number]   [name] 04/04/2015, 4:28 PM

## 2015-04-04 NOTE — Progress Notes (Signed)
Social Work Patient ID: Cody Hodge, male   DOB: 1946-01-08, 69 y.o.   MRN: [id number] Team feels pt is getting close to goals, MD wants to monitor INR and hemoglobin until Thurs. Pt and wife agreeable to this plan and feel more comfortable with this. Work on discharge [place].  Wife here to attend therapies with pt.

## 2015-04-05 ENCOUNTER — Inpatient Hospital Stay (HOSPITAL_COMMUNITY): Payer: Medicare Other | Admitting: Physical Therapy

## 2015-04-05 ENCOUNTER — Inpatient Hospital Stay (HOSPITAL_COMMUNITY): Payer: Medicare Other | Admitting: Occupational Therapy

## 2015-04-05 LAB — PROTIME-INR
INR: 2.12 — AB (ref 0.00–1.49)
Prothrombin Time: 23.5 seconds — ABNORMAL HIGH (ref 11.6–15.2)

## 2015-04-05 MED ORDER — WARFARIN SODIUM 4 MG PO TABS
4.0000 mg | ORAL_TABLET | Freq: Once | ORAL | Status: AC
  Administered 2015-04-05: 4 mg via ORAL
  Filled 2015-04-05 (×2): qty 1

## 2015-04-05 NOTE — Progress Notes (Signed)
Social Work Patient ID: Cody Hodge, male   DOB: November 21, 1945, 69 y.o.   MRN: [id number] Met with pt to inform team conference goals-mod/i and teams feels ready for discharge tomorrow. Pt is agreeable to this plan. Have given him information regarding elongated bedside commode. [facility] to provide follow up PT,OT & RN. MD feels no CPM necessary due to range he is already getting back. Wife has been in for observation in therapies and has seen pt's progress. Prepare for discharge tomorrow.

## 2015-04-05 NOTE — Progress Notes (Signed)
Occupational Therapy Session Note  Patient Details  Name: Cody Hodge MRN: [id number] Date of Birth: Mar 20, 1946  Today's Date: 04/05/2015 OT Individual Time: 1000-1100 OT Individual Time Calculation (min): 60 min    Short Term Goals: Week 1:  OT Short Term Goal 1 (Week 1):  (STG= LTG)  Skilled Therapeutic Interventions/Progress Updates:    Treatment session with focus on activity tolerance, functional mobility, and education with wife regarding activity tolerance.  Pt declined bathing and dressing, secondary to completing at Mod I level during PM session yesterday.  Requested to ambulate outside on various community surfaces.  Ambulated > 300 feet to elevator, took seated rest break prior to ambulating outside.  Ambulated over various surfaces outside, including ramp and crossing street.  Overall Mod I with all mobility.  Required seated rest break prior to returning inside.  Ambulated 300 feet to return to room.  Discussed shower transfer and equipment with wife and energy conservation strategies with pt and wife as pt very motivated to return to prior independent level.  Therapy Documentation Precautions:  Precautions Precautions: Fall, Knee Required Braces or Orthoses:  (KI Discharged) Knee Immobilizer - Right: Discontinue once straight leg raise with < 10 degree lag Knee Immobilizer - Left: Discontinue once straight leg raise with < 10 degree lag Restrictions Weight Bearing Restrictions: Yes RLE Weight Bearing: Weight bearing as tolerated LLE Weight Bearing: Weight bearing as tolerated Other Position/Activity Restrictions: WBAT Pain: Pain Assessment Pain Assessment: 0-10 Pain Score: 2  Pain Type: Surgical pain Pain Location: Knee Pain Orientation: Left;Right Pain Descriptors / Indicators: Aching;Dull Pain Frequency: Intermittent Pain Onset: On-going Patients Stated Pain Goal: 0 Pain Intervention(s): Other (Comment) (pre-medicated) Multiple Pain Sites:  No    Therapy/Group: Individual Therapy  Simonne Come 04/05/2015, 11:50 AM

## 2015-04-05 NOTE — Progress Notes (Signed)
Physical Therapy Discharge Summary  Patient Details  Name: Cody Hodge MRN: [id number] Date of Birth: 1945/09/10  PT Individual Time: 0900-1000 and 8182-9937 PT Individual Time Calculation: 90 min   Patient has met 7 of 7 long term goals due to improved activity tolerance, improved balance, increased strength, increased range of motion, decreased pain, ability to compensate for deficits and functional use of  right lower extremity and left lower extremity.  Patient to discharge at an ambulatory level Modified Independent.   Patient's care partner is independent to provide the necessary physical assistance at discharge.  Reasons goals not met: all goals met  Recommendation:  Patient will benefit from ongoing skilled PT services in home health setting to continue to advance safe functional mobility, address ongoing impairments in strength, ROM, activity tolerance, balance, and minimize fall risk.  Equipment: No equipment provided  Reasons for discharge: treatment goals met and discharge from hospital  Patient/family agrees with progress made and goals achieved: Yes  PT Discharge Precautions/Restrictions Precautions Precautions: Fall;Knee Restrictions Weight Bearing Restrictions: Yes RLE Weight Bearing: Weight bearing as tolerated LLE Weight Bearing: Weight bearing as tolerated Vital Signs Therapy Vitals Temp: 99.1 F (37.3 C) Temp Source: Oral Pulse Rate: 87 Resp: 18 BP: 137/70 mmHg Patient Position (if appropriate): Lying Oxygen Therapy SpO2: 98 % O2 Device: Not Delivered Pain Pain Assessment Pain Assessment: 0-10 Pain Score: 3  Pain Type: Surgical pain Pain Location: Knee Pain Orientation: Left;Right Pain Descriptors / Indicators: Aching;Dull Pain Onset: On-going Patients Stated Pain Goal: 0 Pain Intervention(s): Other (Comment) (pre-medicated) Multiple Pain Sites: No Vision/Perception  Perception Comments: WNL  Cognition Overall Cognitive Status: Within  Functional Limits for tasks assessed Arousal/Alertness: Awake/alert Orientation Level: Oriented X4 Attention: Selective Selective Attention: Appears intact Memory: Impaired Memory Impairment: Decreased recall of new information Awareness: Appears intact Problem Solving: Appears intact Safety/Judgment: Appears intact Sensation Sensation Light Touch: Appears Intact Proprioception: Appears Intact Motor  Motor Motor: Within Functional Limits Motor - Discharge Observations: [facility]  Mobility Bed Mobility Bed Mobility: Supine to Sit;Sit to Supine Supine to Sit: 6: Modified independent (Device/Increase time) Sit to Supine: 6: Modified independent (Device/Increase time) Transfers Transfers: Yes Stand Pivot Transfers: 6: Modified independent (Device/Increase time) (with RW) Locomotion  Ambulation Ambulation: Yes Ambulation/Gait Assistance: 6: Modified independent (Device/Increase time) Ambulation Distance (Feet): 300 Feet Assistive device: Rolling walker Gait Gait: Yes Gait Pattern: Impaired Gait Pattern: Poor foot clearance - left;Poor foot clearance - right;Wide base of support;Right flexed knee in stance;Left flexed knee in stance Gait velocity: slow for age/gender norms Stairs / Additional Locomotion Stairs: Yes Stairs Assistance: 5: Supervision Stair Management Technique: Two rails;Step to pattern Number of Stairs: 12 Height of Stairs: 6 Wheelchair Mobility Wheelchair Mobility: No (pt ambulatory)  Trunk/Postural Assessment  Cervical Assessment Cervical Assessment: Within Functional Limits Thoracic Assessment Thoracic Assessment: Within Functional Limits Lumbar Assessment Lumbar Assessment: Within Functional Limits Postural Control Postural Control: Within Functional Limits  Balance Balance Balance Assessed: Yes Static Sitting Balance Static Sitting - Balance Support: Feet supported Static Sitting - Level of Assistance: 6: Modified independent (Device/Increase  time) Static Standing Balance Static Standing - Balance Support: Bilateral upper extremity supported;During functional activity Static Standing - Level of Assistance: 6: Modified independent (Device/Increase time) Dynamic Standing Balance Dynamic Standing - Balance Support: Left upper extremity supported;During functional activity Dynamic Standing - Level of Assistance: 6: Modified independent (Device/Increase time) Dynamic Standing - Balance Activities: Reaching across midline;Reaching for objects Extremity Assessment  RUE Assessment RUE Assessment: Within Functional Limits LUE Assessment LUE Assessment: Within  Functional Limits RLE Assessment RLE Assessment: Exceptions to [facility] RLE AROM (degrees) Overall AROM Right Lower Extremity: Deficits;Due to pain;Other (Comment);Due to decreased strength RLE Overall AROM Comments: Hip flexion limited due to pain, ankle WFL, knee AROM flex 89, extension -10 RLE Strength RLE Overall Strength: Deficits;Due to pain RLE Overall Strength Comments: hip and knee WFLs, knee 3-/5 LLE Assessment LLE Assessment: Exceptions to WFL LLE AROM (degrees) Overall AROM Left Lower Extremity: Deficits;Due to decreased strength;Due to pain LLE Overall AROM Comments: hip flexion limited secondary to pain, ankle WFLs, knee flex to ext 100 to 15 degrees, PROM ext 5 degrees from full ext, perform SLR with less than 10 degree lag LLE Strength LLE Overall Strength: Deficits LLE Overall Strength Comments: hip and ankle WFLs, knee 3-/5  Function:   Bed Mobility Roll left and right activity   Assist level: No help, No cues, assistive device, takes more than a reasonable amount of time  Sit to lying activity   Assist level: No help, No cues, assistive device, takes more than a reasonable amount of time  Lying to sitting activity   Assist level: No help, No cues, assistive device, takes more than a reasonable amount of time  Mobility details     Transfers Sit to stand  transfer   Sit to stand assist level: No help, no cues, assistive device, takes more than a reasonable amount of time Sit to stand assistive device: Walker  Chair/bed transfer   Chair/bed transfer method: Stand pivot;Ambulatory Chair/bed transfer assist level: No Help, no cues, assistive device, takes more than a reasonable amount of time Chair/bed transfer assistive device: Education administrator transfer assistive device: Publishing rights manager transfer assist level: No help, no cues, assistive device, takes more than a reasonable amount of time    Locomotion Ambulation   Assistive device: Walker-rolling Max distance: 300 Assist level: No help, No cues, assistive device, takes more than a reasonable amount of time  Walk 10 feet activity   Assist level: No help, No cues, assistive device, takes more than a reasonable amount of time  Walk 50 feet with 2 turns activity   Assist level: No help, No cues, assistive device, takes more than a reasonable amount of time  Walk 150 feet activity   Assist level: No help, No cues, assistive device, takes more than a reasonable amount of time  Walk 10 feet on uneven surfaces activity   Assist level: No help, No cues, assistive device, takes more than a reasonable amount of time  Stairs   Stairs assistive device: 2 hand rails Max number of stairs: 12 Stairs assist level: No help, no cues, assistive device, takes more than a reasonable amount of time  Walk up/down 1 step activity     Walk up/down 1 step (curb) assist level: No help, no cues, assistive device, takes more than a reasonable amount of time  Walk up/down 4 steps activity      Walk up/down 12 steps activity   Walk up/down 12 steps assist level: No help, no cues, assistive device, takes more than a reasonable amount of time  Pick up small objects from floor   Assist level: Supervision or verbal cues  Wheelchair          Wheel 50 feet with 2 turns activity       Wheel 150 feet activity  Skilled Therapeutic Intervention 0900-1000: Pt received supine in bed, c/o pain as described above and agreeable to treatment. Performed car transfers with modI. Performed bed mobility and transfer in simulated apartment on R side of bed to simulate home environment. Gait training x300' with RW and modI. Stair training x12 stairs 6" height with BUE on handrails and mod I. Nustep x10 min to improve BLE strength and ROM. Supine LE exercises performed including quad sets, heel slides, and seated long arc quad, as well as AROM measured as described above. Pt ambulated back to room with RW and mod I. Pt remained supine in bed with all needs within reach at completion of session.   1500-1530: Pt received in bed with c/o 3/10 pain in bilateral knees and reports having recently taken pain medication, otherwise agreeable to treatment. Pt's wife reports having misplaced HEP given at yesterdays session; printed new handouts for pt and wife and reviewed exercises as well as safe progression per pt tolerance. Session focused on gait training in community setting with rolling walker, including management of curb step and inclines. Pt does not require any assistance and wife appropriately cues pt for upright posture and safety as necessary. Pt returned to room and remained supine in bed at completion of session, all needs within reach.    Benjiman Core Tygielski 04/05/2015, 10:03 AM

## 2015-04-05 NOTE — Progress Notes (Signed)
Physical Therapy Session Note  Patient Details  Name: Cody Hodge MRN: [id number] Date of Birth: 12-01-1945  Today's Date: 04/05/2015 PT Individual Time: 1300-1400 PT Individual Time Calculation (min): 60 min   Short Term Goals: Week 1:  PT Short Term Goal 1 (Week 1): STGs equal LTGs  Skilled Therapeutic Interventions/Progress Updates:    PT instructed patient in community level gait training with RW. Pt negotiated 4 steps with RW and 4 steps with 1 hand rail, step to pattern forward ascend/descend and mod I. Pt amb over uneven surfaces, over thresholds, around obstacles, and through doorways with mod I. Pt returned to room at end of session, ice applied to bilateral knees, call bell in reach, and needs met.   Therapy Documentation Precautions:  Precautions Precautions: Fall, Knee Required Braces or Orthoses:  (KI Discharged) Knee Immobilizer - Right: Discontinue once straight leg raise with < 10 degree lag Knee Immobilizer - Left: Discontinue once straight leg raise with < 10 degree lag Restrictions Weight Bearing Restrictions: Yes RLE Weight Bearing: Weight bearing as tolerated LLE Weight Bearing: Weight bearing as tolerated Other Position/Activity Restrictions: WBAT Pain: Pain Assessment Pain Assessment: 0-10 Pain Score: 2  Pain Location: Knee Pain Orientation: Right;Left Pain Intervention(s): Emotional support   Function:   Bed Mobility Roll left and right activity   Assist level: No help, No cues, assistive device, takes more than a reasonable amount of time    Sit to lying activity   Assist level: No help, No cues, assistive device, takes more than a reasonable amount of time    Lying to sitting activity   Assist level: No help, No cues, assistive device, takes more than a reasonable amount of time    Mobility details     Transfers Sit to stand transfer   Sit to stand assist level: No help, no cues, assistive device, takes more than a reasonable amount of  time    Chair/bed transfer   Chair/bed transfer method: Ambulatory Chair/bed transfer assist level: No Help, no cues, assistive device, takes more than a reasonable amount of time Chair/bed transfer assistive device: Banker device: Walker-rolling Max distance: >300  Assist level: No help, No cues, assistive device, takes more than a reasonable amount of time  Walk 10 feet activity   Assist level: No help, No cues, assistive device, takes more than a reasonable amount of time  Walk 50 feet with 2 turns activity   Assist level: No help, No cues, assistive device, takes more than a reasonable amount of time  Walk 150 feet activity   Assist level: No help, No cues, assistive device, takes more than a reasonable amount of time  Walk 10 feet on uneven surfaces activity   Assist level: No help, No cues, assistive device, takes more than a reasonable amount of time  Stairs          Walk up/down 1 step activity        Walk up/down 4 steps activity      Walk up/down 12 steps activity      Pick up small objects from floor      Wheelchair          Wheel 50 feet with 2 turns activity      Wheel 150 feet  activity         Therapy/Group: Individual Therapy  Cody Hodge 04/05/2015, 2:42 PM

## 2015-04-05 NOTE — Patient Care Conference (Signed)
Inpatient RehabilitationTeam Conference and Plan of Care Update Date: 04/05/2015   Time: 11;10 AM    Patient Name: Cody Hodge      Medical Record Number: [id number]  Date of Birth: 1945-11-27 Sex: Male         Room/Bed: 4M13C/4M13C-01 Payor Info: Payor: MEDICARE / Plan: MEDICARE PART A AND B / Product Type: *No Product type* /    Admitting Diagnosis: bilateral tkr  Admit Date/Time:  04/01/2015  2:23 PM Admission Comments: No comment available   Primary Diagnosis:  <principal problem not specified> Principal Problem: <principal problem not specified>  Patient Active Problem List   Diagnosis Date Noted  . Postoperative anemia due to acute blood loss 04/03/2015  . Leucocytosis 04/03/2015  . OA (osteoarthritis) of knee 03/29/2015  . Abnormal myocardial perfusion study 10/06/2014  . Dyspnea on exertion 09/26/2014  . Family history of early CAD 09/26/2014  . HTN (hypertension)   . Mixed hyperlipidemia   . OSA (obstructive sleep apnea)   . PAF (paroxysmal atrial fibrillation)     Expected Discharge Date: Expected Discharge Date: 04/06/15  Team Members Present: Physician leading conference: Dr. Alysia Penna Social Worker Present: Ovidio Kin, LCSW Nurse Present: Heather Roberts, RN PT Present: Kem Parkinson, PT OT Present: Willeen Cass, Jules Schick, OT SLP Present: Windell Moulding, SLP PPS Coordinator present : Daiva Nakayama, RN, CRRN     Current Status/Progress Goal Weekly Team Focus  Medical   pain control is ok, INR now therapeutic  Mod I levels  D/C planning   Bowel/Bladder   Continet bowel and bladder LBM 8/22  remain continent   offer laxatives as needed. Keep urinal at bedside    Swallow/Nutrition/ Hydration     na        ADL's   Setup to Mod I  Mod I overall  pain management, activity tolerance, self-care retraining, D/C planning   Mobility   supervision to mod I  mod I overall  pain management, BLE ROM, activity tolerance, HEP in preparation for d/c  home   Communication     na        Safety/Cognition/ Behavioral Observations  min assist   mod I   keep call bell in reach and hourly rounding    Pain   less than 3 out of 10 with PRN oxy IR 10mg  q4h and tylenol 650 q4h  remain less than 3 out of 10   continue to assess pain - offer PRNs as needed   Skin   steri strips to bilateral knees- smear of blood noted on right knee- steri strip reapplied  no breakdown, s/s of infection   assess skin q shift      *See Care Plan and progress notes for long and short-term goals.  Barriers to Discharge: anemia, hemorrhoids on anticoag    Possible Resolutions to Barriers:  d/c loveniox when INR is stable, monitor Hgb    Discharge Planning/Teaching Needs:  Home with wife who can provide supervision if needed, pt's goals are mod/i and ready tomorrow.      Team Discussion:  On coumadin now-pain managed. Knee range L-100 R-89. Working on knee extension. Reaching mod/i level goals will be ready for DC tomorrow. Wife has been in for observation in therapies  Revisions to Treatment Plan:  None   Continued Need for Acute Rehabilitation Level of Care: The patient requires daily medical management by a physician with specialized training in physical medicine and rehabilitation for the following conditions: Daily direction of a  multidisciplinary physical rehabilitation program to ensure safe treatment while eliciting the highest outcome that is of practical value to the patient.: Yes Daily medical management of patient stability for increased activity during participation in an intensive rehabilitation regime.: Yes Daily analysis of laboratory values and/or radiology reports with any subsequent need for medication adjustment of medical intervention for : Other;Post surgical problems  Deric Bocock, Gardiner Rhyme 04/05/2015, 12:58 PM

## 2015-04-05 NOTE — Progress Notes (Signed)
ANTICOAGULATION CONSULT NOTE - Follow Up Consult  Pharmacy Consult for coumadin Indication: VTE prophylaxis  No Known Allergies  Patient Measurements: Height: 5\' 10"  (177.8 cm) Weight: 200 lb 4.8 oz (90.855 kg) IBW/kg (Calculated) : 73 Heparin Dosing Weight:   Vital Signs: Temp: 99.1 F (37.3 C) (08/24 0636) Temp Source: Oral (08/24 0636) BP: 137/70 mmHg (08/24 0636) Pulse Rate: 87 (08/24 0636)  Labs:  Recent Labs  04/03/15 0517 04/03/15 0951 04/04/15 0645 04/05/15 0525  HGB  --  8.8*  --   --   HCT  --  26.3*  --   --   PLT  --  328  --   --   LABPROT 22.3*  --  20.9* 23.5*  INR 1.97*  --  1.80* 2.12*    Estimated Creatinine Clearance: 85 mL/min (by C-G formula based on Cr of 0.93).   Medications:  Scheduled:  . atorvastatin  20 mg Oral Daily  . diltiazem  120 mg Oral Daily  . enoxaparin (LOVENOX) injection  30 mg Subcutaneous Q12H  . finasteride  5 mg Oral Daily  . flecainide  100 mg Oral BID  . methocarbamol  500 mg Oral QHS,MR X 1  . OxyCODONE  10 mg Oral QHS  . polyethylene glycol  17 g Oral Daily  . senna-docusate  2 tablet Oral BID  . tamsulosin  0.4 mg Oral QHS  . triamcinolone  1 spray Each Nare QHS  . warfarin   Does not apply Once  . Warfarin - Pharmacist Dosing Inpatient   Does not apply q1800   Infusions:    Assessment: 69 yo male is currently on therapeutic coumadin for VTE prophylaxis.  INR today is 2.12 from 1.8.  Goal of Therapy:  INR 2-2.5 Monitor platelets by anticoagulation protocol: Yes   Plan:  Warfarin 4 mg po x 1 tonight D/c lovenox Daily PT/INR Monitor for bleeding  Yuval Rubens, Tsz-Yin 04/05/2015,8:15 AM

## 2015-04-05 NOTE — Progress Notes (Signed)
Social Work [name], LCSW Social Worker Signed  Patient Care Conference 04/05/2015 12:58 PM    Expand All Collapse All   Inpatient RehabilitationTeam Conference and Plan of Care Update Date: 04/05/2015   Time: 11;10 AM     Patient Name: Cody Hodge       Medical Record Number: [id number]  Date of Birth: 1946-07-31 Sex: Male         Room/Bed: 4M13C/4M13C-01 Payor Info: Payor: MEDICARE / Plan: MEDICARE PART A AND B / Product Type: *No Product type* /    Admitting Diagnosis: bilateral tkr   Admit Date/Time:  04/01/2015  2:23 PM Admission Comments: No comment available   Primary Diagnosis:  <principal problem not specified> Principal Problem: <principal problem not specified>    Patient Active Problem List     Diagnosis  Date Noted   .  Postoperative anemia due to acute blood loss  04/03/2015   .  Leucocytosis  04/03/2015   .  OA (osteoarthritis) of knee  03/29/2015   .  Abnormal myocardial perfusion study  10/06/2014   .  Dyspnea on exertion  09/26/2014   .  Family history of early CAD  09/26/2014   .  HTN (hypertension)     .  Mixed hyperlipidemia     .  OSA (obstructive sleep apnea)     .  PAF (paroxysmal atrial fibrillation)       Expected Discharge Date: Expected Discharge Date: 04/06/15  Team Members Present: Physician leading conference: Dr. Alysia Penna Social Worker Present: Ovidio Kin, LCSW Nurse Present: Heather Roberts, RN PT Present: Kem Parkinson, PT OT Present: Willeen Cass, Jules Schick, OT SLP Present: Windell Moulding, SLP PPS Coordinator present : Daiva Nakayama, RN, CRRN        Current Status/Progress  Goal  Weekly Team Focus   Medical     pain control is ok, INR now therapeutic   Mod I levels  D/C planning   Bowel/Bladder     Continet bowel and bladder LBM 8/22   remain continent   offer laxatives as needed. Keep urinal at bedside    Swallow/Nutrition/ Hydration       na         ADL's     Setup to Mod I  Mod I overall  pain  management, activity tolerance, self-care retraining, D/C planning   Mobility     supervision to mod I  mod I overall  pain management, BLE ROM, activity tolerance, HEP in preparation for d/c home   Communication       na         Safety/Cognition/ Behavioral Observations    min assist   mod I   keep call bell in reach and hourly rounding    Pain     less than 3 out of 10 with PRN oxy IR 10mg  q4h and tylenol 650 q4h  remain less than 3 out of 10   continue to assess pain - offer PRNs as needed    Skin     steri strips to bilateral knees- smear of blood noted on right knee- steri strip reapplied  no breakdown, s/s of infection   assess skin q shift      *See Care Plan and progress notes for long and short-term goals.    Barriers to Discharge:  anemia, hemorrhoids on anticoag     Possible Resolutions to Barriers:   d/c loveniox when INR is stable, monitor Hgb     Discharge  Planning/Teaching Needs:   Home with wife who can provide supervision if needed, pt's goals are mod/i and ready tomorrow.       Team Discussion:    On coumadin now-pain managed. Knee range L-100 R-89. Working on knee extension. Reaching mod/i level goals will be ready for DC tomorrow. Wife has been in for observation in therapies   Revisions to Treatment Plan:    None    Continued Need for Acute Rehabilitation Level of Care: The patient requires daily medical management by a physician with specialized training in physical medicine and rehabilitation for the following conditions: Daily direction of a multidisciplinary physical rehabilitation program to ensure safe treatment while eliciting the highest outcome that is of practical value to the patient.: Yes Daily medical management of patient stability for increased activity during participation in an intensive rehabilitation regime.: Yes Daily analysis of laboratory values and/or radiology reports with any subsequent need for medication adjustment of medical  intervention for : Other;Post surgical problems  [name] 04/05/2015, 12:58 PM                  Patient ID: Cody Hodge, male   DOB: [birth year], 69 y.o.   MRN: [id number]

## 2015-04-05 NOTE — Progress Notes (Addendum)
Subjective/Complaints: Patient working in physical therapy. His wife is with him. She has arranged to clear her schedule Friday. We discussed discharge date of 04/06/2015, should be ready from medical standpoint Review of Systems - +constip, neg knee pain, neg CP,or SOB  Objective: Vital Signs: Blood pressure 137/70, pulse 87, temperature 99.1 F (37.3 C), temperature source Oral, resp. rate 18, height _0  (1.778 m), weight 90.855 kg (200 lb 4.8 oz), SpO2 98 %. No results found. Results for orders placed or performed during the hospital encounter of 04/01/15 (from the past 72 hour(s))  Protime-INR     Status: Abnormal   Collection Time: 04/03/15  5:17 AM  Result Value Ref Range   Prothrombin Time 22.3 (H) 11.6 - 15.2 seconds   INR 1.97 (H) 0.00 - 1.49  CBC     Status: Abnormal   Collection Time: 04/03/15  9:51 AM  Result Value Ref Range   WBC 10.7 (H) 4.0 - 10.5 K/uL   RBC 3.12 (L) 4.22 - 5.81 MIL/uL   Hemoglobin 8.8 (L) 13.0 - 17.0 g/dL   HCT 26.3 (L) 39.0 - 52.0 %   MCV 84.3 78.0 - 100.0 fL   MCH 28.2 26.0 - 34.0 pg   MCHC 33.5 30.0 - 36.0 g/dL   RDW 13.5 11.5 - 15.5 %   Platelets 328 150 - 400 K/uL  Protime-INR     Status: Abnormal   Collection Time: 04/04/15  6:45 AM  Result Value Ref Range   Prothrombin Time 20.9 (H) 11.6 - 15.2 seconds   INR 1.80 (H) 0.00 - 1.49  Protime-INR     Status: Abnormal   Collection Time: 04/05/15  5:25 AM  Result Value Ref Range   Prothrombin Time 23.5 (H) 11.6 - 15.2 seconds   INR 2.12 (H) 0.00 - 1.49     HEENT: normal  Extremity:  BS positive and NT, ND Skin:   Wound C/D/I and steristrips Neuro: Alert/Oriented and Abnormal Motor 4+/5 B HF, 4 KE, 4- KF, 4+/5 B ADF Musc/Skel:  Extremity tender bilateral knees, flexion to 90deg actively Gen NAD   Assessment/Plan: 1. Functional deficits secondary to Bilateral TKR for end stage OA which require 3+ hours per day of interdisciplinary therapy in a comprehensive inpatient rehab  setting. Physiatrist is providing close team supervision and 24 hour management of active medical problems listed below. Physiatrist and rehab team continue to assess barriers to discharge/monitor patient progress toward functional and medical goals. Team conference today please see physician documentation under team conference tab, met with team face-to-face to discuss problems,progress, and goals. Formulized individual treatment plan based on medical history, underlying problem and comorbidities. We'll discuss with patient ready from a rehabilitation standpoint for discharge tomorrow FIM: Function - Bathing Position: Shower Body parts bathed by patient: Right arm, Left arm, Chest, Abdomen, Front perineal area, Buttocks, Left upper leg, Right upper leg, Right lower leg, Left lower leg, Back Body parts bathed by helper: Right lower leg, Left lower leg, Back Assist Level: More than reasonable time Set up : To obtain items  Function- Upper Body Dressing/Undressing What is the patient wearing?: Pull over shirt/dress Pull over shirt/dress - Perfomed by patient: Thread/unthread right sleeve, Thread/unthread left sleeve, Put head through opening, Pull shirt over trunk Assist Level: More than reasonable time Set up : To obtain clothing/put away Function - Lower Body Dressing/Undressing What is the patient wearing?: Underwear, Pants, Shoes, Socks Position: Sitting EOB Underwear - Performed by patient: Thread/unthread right underwear leg, Thread/unthread left  underwear leg, Pull underwear up/down Pants- Performed by patient: Thread/unthread right pants leg, Thread/unthread left pants leg, Pull pants up/down Socks - Performed by patient: Don/doff right sock, Don/doff left sock Socks - Performed by helper: Don/doff right sock, Don/doff left sock Shoes - Performed by patient: Don/doff right shoe, Don/doff left shoe, Fasten right, Fasten left Assist Level: More than reasonable time Set up : To obtain  clothing/put away  Function - Toileting Toileting steps completed by patient: Adjust clothing prior to toileting, Adjust clothing after toileting, Performs perineal hygiene Toileting steps completed by helper: Performs perineal hygiene Toileting Assistive Devices: Grab bar or rail Assist level: No help/no cues  Function - Air cabin crew transfer assistive device: Elevated toilet seat/BSC over toilet, Walker Assist level to toilet: No Help, no cues, assistive device, takes more than a reasonable amount of time Assist level from toilet: No Help, no cues, assistive device, takes more than a reasonable amount of time  Function - Chair/bed transfer Chair/bed transfer method: Stand pivot, Ambulatory Chair/bed transfer assist level: No Help, no cues, assistive device, takes more than a reasonable amount of time Chair/bed transfer assistive device: Walker Chair/bed transfer details: Verbal cues for sequencing, Verbal cues for precautions/safety  Function - Locomotion: Wheelchair Will patient use wheelchair at discharge?: No Type: Manual Max wheelchair distance: 150 Assist Level: Supervision or verbal cues Assist Level: Supervision or verbal cues Assist Level: Supervision or verbal cues Function - Locomotion: Ambulation Assistive device: Walker-rolling Max distance: 300 Assist level: Supervision or verbal cues Assist level: Supervision or verbal cues Assist level: Supervision or verbal cues Assist level: Supervision or verbal cues Walk 10 feet on uneven surfaces activity did not occur: Safety/medical concerns  Function - Comprehension Comprehension: Auditory Comprehension assist level: Follows complex conversation/direction with no assist  Function - Expression Expression: Verbal Expression assist level: Expresses complex ideas: With extra time/assistive device  Function - Social Interaction Social Interaction assist level: Interacts appropriately with others - No  medications needed.  Function - Problem Solving Problem solving assist level: Solves complex 90% of the time/cues < 10% of the time  Function - Memory Memory assist level: More than reasonable amount of time Patient normally able to recall (first 3 days only): That he or she is in a hospital, Staff names and faces, Location of own room, Current season  Medical Problem List and Plan: 1. Functional deficits secondary to Decline in functional abilities following total knee replacements 2. DVT Prophylaxis/Anticoagulation: Pharmaceutical: Lovenox till coumadin therapeutic, INR  1.97> 1.8>2.12 , will stay on lovenox for overlap x 1 d     3. Pain Management: oxyCR at night 4. Mood: LCSW to follow for evaluation and support.  5. Neuropsych: This patient is capable of making decisions on his own behalf. 6. Skin/Wound Care: Routine pressure relief measures. Maintain adequate nutrition and hydration status 7. Fluids/Electrolytes/Nutrition: Monitor I/O. Maintain adequate nutrition and hydration.  8. Leukocytosis: Monitor for signs of infection UA negative  9. Dehydration: Push po fluids. Check lytes in am.  10. Constipation: continue Miralax. Senna S           11.  ABLA asymptomatic , Fe supplement rec but pt concerned about increased constipation repeat  hgb 8.8 which is trending up  , repeat in am 11.  Pain management- long discussion- went over long acting vs short acting oxycodone, will start QHS dose while in hospital.  Pt to work with RN staff for optimal timing4 A FACE TO [place] E  04/05/2015, 7:53 AM

## 2015-04-05 NOTE — Progress Notes (Signed)
Occupational Therapy Discharge Summary  Patient Details  Name: Cody Hodge MRN: [id number] Date of Birth: 12-04-1945  Patient has met 9 of 9 long term goals due to improved activity tolerance, postural control and ability to compensate for deficits.  Patient to discharge at overall Modified Independent level.  Patient's care partner is independent to provide the necessary assistance at discharge.    Reasons goals not met: NA  Recommendation:  Patient will not require follow up OT at this point.  Equipment: No equipment provided.  Pt has borrowed a shower seat from friend.  He intends to purchase 3 in 1 for commode.  Reasons for discharge: treatment goals met and discharge from hospital  Patient/family agrees with progress made and goals achieved: Yes  OT Discharge Precautions/Restrictions  Precautions Precautions: Fall;Knee Restrictions Weight Bearing Restrictions: Yes RLE Weight Bearing: Weight bearing as tolerated LLE Weight Bearing: Weight bearing as tolerated Pain Pain Assessment Pain Assessment: 0-10 Pain Score: 2  Pain Type: Surgical pain Pain Location: Knee Pain Orientation: Left;Right Pain Descriptors / Indicators: Aching;Dull Pain Frequency: Intermittent Pain Onset: On-going Patients Stated Pain Goal: 0 Pain Intervention(s): Other (Comment) (pre-medicated) Multiple Pain Sites: No ADL   Vision/Perception  Vision- History Baseline Vision/History: No visual deficits Patient Visual Report: No change from baseline Vision- Assessment Vision Assessment?: No apparent visual deficits Perception Comments: WNL  Cognition Overall Cognitive Status: Within Functional Limits for tasks assessed Arousal/Alertness: Awake/alert Orientation Level: Oriented X4 Attention: Selective Selective Attention: Appears intact Memory: Impaired Memory Impairment: Decreased recall of new information Awareness: Appears intact Problem Solving: Appears intact Safety/Judgment:  Appears intact Sensation Sensation Light Touch: Appears Intact Stereognosis: Not tested Hot/Cold: Appears Intact Proprioception: Appears Intact Coordination Gross Motor Movements are Fluid and Coordinated: Yes Fine Motor Movements are Fluid and Coordinated: Yes Motor  Motor Motor: Within Functional Limits Motor - Discharge Observations: [facility] Mobility  Bed Mobility Bed Mobility: Supine to Sit;Sit to Supine Supine to Sit: 6: Modified independent (Device/Increase time) Sit to Supine: 6: Modified independent (Device/Increase time)  Trunk/Postural Assessment  Cervical Assessment Cervical Assessment: Within Functional Limits Thoracic Assessment Thoracic Assessment: Within Functional Limits Lumbar Assessment Lumbar Assessment: Within Functional Limits Postural Control Postural Control: Within Functional Limits  Balance Balance Balance Assessed: Yes Static Sitting Balance Static Sitting - Balance Support: Feet supported Static Sitting - Level of Assistance: 6: Modified independent (Device/Increase time) Static Standing Balance Static Standing - Balance Support: Bilateral upper extremity supported;During functional activity Static Standing - Level of Assistance: 6: Modified independent (Device/Increase time) Dynamic Standing Balance Dynamic Standing - Balance Support: Left upper extremity supported;During functional activity Dynamic Standing - Level of Assistance: 6: Modified independent (Device/Increase time) Dynamic Standing - Balance Activities: Reaching across midline;Reaching for objects Extremity/Trunk Assessment RUE Assessment RUE Assessment: Within Functional Limits LUE Assessment LUE Assessment: Within Functional Limits    Firmin Belisle, [place] 04/05/2015, 12:28 PM

## 2015-04-05 NOTE — Progress Notes (Signed)
Social Work Patient ID: Cody Hodge, male   DOB: 04-16-1946, 69 y.o.   MRN: [id number] Pt feels it is not necessary to go to his PCP upon discharge, his main MD appointments should be with his ortho surgeon. Will not make follow up appointment for him.

## 2015-04-06 DIAGNOSIS — Z96653 Presence of artificial knee joint, bilateral: Secondary | ICD-10-CM | POA: Diagnosis present

## 2015-04-06 LAB — PROTIME-INR
INR: 2.68 — ABNORMAL HIGH (ref 0.00–1.49)
Prothrombin Time: 28.1 seconds — ABNORMAL HIGH (ref 11.6–15.2)

## 2015-04-06 MED ORDER — ACETAMINOPHEN 325 MG PO TABS: 325.0000 mg | ORAL_TABLET | ORAL | Status: DC | PRN

## 2015-04-06 MED ORDER — WARFARIN SODIUM 3 MG PO TABS
3.0000 mg | ORAL_TABLET | Freq: Once | ORAL | Status: DC
  Filled 2015-04-06: qty 1

## 2015-04-06 MED ORDER — OXYCODONE HCL 5 MG PO TABS: 5.0000 mg | ORAL_TABLET | Freq: Four times a day (QID) | ORAL | Status: DC | PRN

## 2015-04-06 MED ORDER — WARFARIN SODIUM 2 MG PO TABS: 3.0000 mg | ORAL_TABLET | Freq: Every day | ORAL | Status: DC

## 2015-04-06 MED ORDER — POLYETHYLENE GLYCOL 3350 17 G PO PACK: 17.0000 g | PACK | Freq: Every day | ORAL | Status: DC

## 2015-04-06 MED ORDER — POLYETHYLENE GLYCOL 3350 17 G PO PACK: 17.0000 g | PACK | Freq: Every day | ORAL | Status: DC | PRN

## 2015-04-06 MED ORDER — SENNOSIDES-DOCUSATE SODIUM 8.6-50 MG PO TABS: 2.0000 | ORAL_TABLET | Freq: Two times a day (BID) | ORAL | Status: DC | PRN

## 2015-04-06 MED ORDER — TRAMADOL HCL 50 MG PO TABS: 50.0000 mg | ORAL_TABLET | Freq: Four times a day (QID) | ORAL | Status: DC | PRN

## 2015-04-06 MED ORDER — SENNOSIDES-DOCUSATE SODIUM 8.6-50 MG PO TABS: 2.0000 | ORAL_TABLET | Freq: Two times a day (BID) | ORAL | Status: DC

## 2015-04-06 NOTE — Progress Notes (Signed)
Social Work Discharge Note Discharge Note  The overall goal for the admission was met for:   Discharge location: Yes-HOME WITH WIFE-PT MOD/I  Length of Stay: Yes-5 DAYS  Discharge activity level: Yes-MOD/I LEVEL  Home/community participation: Yes  Services provided included: MD, RD, PT, OT, RN, CM, TR, Pharmacy and SW  Financial Services: Medicare and Private Insurance: [place]  Follow-up services arranged: Home Health: [facility]-PT,OT,RN and Patient/Family request agency HH: MD REQUEST, DME: NO NEEDS  Comments (or additional information):WIFE Corry, PT MOD/I LEVEL  Patient/Family verbalized understanding of follow-up arrangements: Yes  Individual responsible for coordination of the follow-up plan: SELF & CHRIS-WIFE  Confirmed correct DME delivered: [name] 04/06/2015    [name]

## 2015-04-06 NOTE — Progress Notes (Signed)
Late entry: Pt discharged home with wife @1030AM . Discharge instruction provided by Algis Liming, PA. All questions answered, pt verbalized understanding. Pain medication given prior to discharge, per pt's request. Pt escorted off unit in w/c with personal belonging by Jasmin, NT.

## 2015-04-06 NOTE — Progress Notes (Signed)
Subjective/Complaints: Pt slept well, mod I in room Review of Systems - +constip, neg knee pain, neg CP,or SOB  Objective: Vital Signs: Blood pressure 118/72, pulse 80, temperature 98.6 F (37 C), temperature source Oral, resp. rate 16, height 5\' 10"  (1.778 m), weight 89.132 kg (196 lb 8 oz), SpO2 98 %. No results found. Results for orders placed or performed during the hospital encounter of 04/01/15 (from the past 72 hour(s))  CBC     Status: Abnormal   Collection Time: 04/03/15  9:51 AM  Result Value Ref Range   WBC 10.7 (H) 4.0 - 10.5 K/uL   RBC 3.12 (L) 4.22 - 5.81 MIL/uL   Hemoglobin 8.8 (L) 13.0 - 17.0 g/dL   HCT 26.3 (L) 39.0 - 52.0 %   MCV 84.3 78.0 - 100.0 fL   MCH 28.2 26.0 - 34.0 pg   MCHC 33.5 30.0 - 36.0 g/dL   RDW 13.5 11.5 - 15.5 %   Platelets 328 150 - 400 K/uL  Protime-INR     Status: Abnormal   Collection Time: 04/04/15  6:45 AM  Result Value Ref Range   Prothrombin Time 20.9 (H) 11.6 - 15.2 seconds   INR 1.80 (H) 0.00 - 1.49  Protime-INR     Status: Abnormal   Collection Time: 04/05/15  5:25 AM  Result Value Ref Range   Prothrombin Time 23.5 (H) 11.6 - 15.2 seconds   INR 2.12 (H) 0.00 - 1.49  Protime-INR     Status: Abnormal   Collection Time: 04/06/15  6:08 AM  Result Value Ref Range   Prothrombin Time 28.1 (H) 11.6 - 15.2 seconds   INR 2.68 (H) 0.00 - 1.49     HEENT: normal Cor RRR Extremity:  BS positive and NT, ND Skin:   Wound C/D/I and steristrips Neuro: Alert/Oriented and Abnormal Motor 4+/5 B HF, 4 KE, 4- KF, 4+/5 B ADF Musc/Skel:  Extremity tender bilateral knees, flexion to 90deg actively Gen NAD   Assessment/Plan: 1. Functional deficits secondary to Bilateral TKR for end stage OA  Stable for D/C today F/u ortho in 1-2 weeks  See D/C summary See D/C instructionsFIM: Function - Bathing Position: Shower Body parts bathed by patient: Right arm, Left arm, Chest, Abdomen, Front perineal area, Buttocks, Left upper leg, Right upper  leg, Right lower leg, Left lower leg, Back Body parts bathed by helper: Right lower leg, Left lower leg, Back Assist Level: More than reasonable time Set up : To obtain items  Function- Upper Body Dressing/Undressing What is the patient wearing?: Pull over shirt/dress Pull over shirt/dress - Perfomed by patient: Thread/unthread right sleeve, Thread/unthread left sleeve, Put head through opening, Pull shirt over trunk Assist Level: More than reasonable time Set up : To obtain clothing/put away Function - Lower Body Dressing/Undressing What is the patient wearing?: Underwear, Pants, Shoes, Socks Position: Sitting EOB Underwear - Performed by patient: Thread/unthread right underwear leg, Thread/unthread left underwear leg, Pull underwear up/down Pants- Performed by patient: Thread/unthread right pants leg, Thread/unthread left pants leg, Pull pants up/down Socks - Performed by patient: Don/doff right sock, Don/doff left sock Socks - Performed by helper: Don/doff right sock, Don/doff left sock Shoes - Performed by patient: Don/doff right shoe, Don/doff left shoe, Fasten right, Fasten left Assist Level: More than reasonable time Set up : To obtain clothing/put away  Function - Toileting Toileting steps completed by patient: Adjust clothing prior to toileting, Performs perineal hygiene, Adjust clothing after toileting Toileting steps completed by helper: Performs perineal  hygiene Toileting Assistive Devices: Grab bar or rail Assist level: No help/no cues  Function Midwife transfer assistive device: Elevated toilet seat/BSC over toilet, Walker Assist level to toilet: No Help, no cues, assistive device, takes more than a reasonable amount of time Assist level from toilet: No Help, no cues, assistive device, takes more than a reasonable amount of time  Function - Chair/bed transfer Chair/bed transfer method: Ambulatory Chair/bed transfer assist level: No Help, no cues,  assistive device, takes more than a reasonable amount of time Chair/bed transfer assistive device: Walker Chair/bed transfer details: Verbal cues for sequencing, Verbal cues for precautions/safety  Function - Locomotion: Wheelchair Will patient use wheelchair at discharge?: No Type: Manual Max wheelchair distance: 150 Assist Level: Supervision or verbal cues Assist Level: Supervision or verbal cues Assist Level: Supervision or verbal cues Function - Locomotion: Ambulation Assistive device: Walker-rolling Max distance: >300  Assist level: No help, No cues, assistive device, takes more than a reasonable amount of time Assist level: No help, No cues, assistive device, takes more than a reasonable amount of time Assist level: No help, No cues, assistive device, takes more than a reasonable amount of time Assist level: No help, No cues, assistive device, takes more than a reasonable amount of time Walk 10 feet on uneven surfaces activity did not occur: Safety/medical concerns Assist level: No help, No cues, assistive device, takes more than a reasonable amount of time  Function - Comprehension Comprehension: Auditory Comprehension assist level: Follows complex conversation/direction with no assist  Function - Expression Expression: Verbal Expression assist level: Expresses complex ideas: With extra time/assistive device  Function - Social Interaction Social Interaction assist level: Interacts appropriately with others - No medications needed.  Function - Problem Solving Problem solving assist level: Solves complex problems: With extra time  Function - Memory Memory assist level: More than reasonable amount of time Patient normally able to recall (first 3 days only): That he or she is in a hospital, Staff names and faces, Location of own room, Current season  Medical Problem List and Plan: 1. Functional deficits secondary to Decline in functional abilities following total knee  replacements 2. DVT Prophylaxis/Anticoagulation: Pharmaceutical:, INR  Therapeutic, D/C lovenox   4. Mood: LCSW to follow for evaluation and support.  5. Neuropsych: This patient is capable of making decisions on his own behalf. 6. Skin/Wound Care: Routine pressure relief measures. Maintain adequate nutrition and hydration status 7. Fluids/Electrolytes/Nutrition: Monitor I/O. Maintain adequate nutrition and hydration. stable 8. Constipation: continue Miralax. Senna S           11.  ABLA asymptomatic , Fe supplement rec but pt concerned about increased constipation repeat  hgb 8.8 which is trending up  ,no further hemorrhoidal bleeding may hold off on CBC 9.  Pain management- long discussion- D/C oxycontin, reduce oxy IR to 5-10mg  Q 6 hr prn   10.  Questionable hx of Afib f/u cardiology as outpt           g5 A FACE TO [place] E 04/06/2015, 8:27 AM

## 2015-04-06 NOTE — Progress Notes (Signed)
ANTICOAGULATION CONSULT NOTE - Follow Up Consult  Pharmacy Consult for coumadin Indication: VTE prophylaxis  No Known Allergies  Patient Measurements: Height: 5\' 10"  (177.8 cm) Weight: 196 lb 8 oz (89.132 kg) IBW/kg (Calculated) : 73 Heparin Dosing Weight:   Vital Signs: Temp: 98.6 F (37 C) (08/25 0500) Temp Source: Oral (08/25 0500) BP: 118/72 mmHg (08/25 0500) Pulse Rate: 80 (08/25 0500)  Labs:  Recent Labs  04/03/15 0951 04/04/15 0645 04/05/15 0525 04/06/15 0608  HGB 8.8*  --   --   --   HCT 26.3*  --   --   --   PLT 328  --   --   --   LABPROT  --  20.9* 23.5* 28.1*  INR  --  1.80* 2.12* 2.68*    Estimated Creatinine Clearance: 84.2 mL/min (by C-G formula based on Cr of 0.93).   Medications:  Scheduled:  . atorvastatin  20 mg Oral Daily  . diltiazem  120 mg Oral Daily  . finasteride  5 mg Oral Daily  . flecainide  100 mg Oral BID  . methocarbamol  500 mg Oral QHS,MR X 1  . polyethylene glycol  17 g Oral Daily  . senna-docusate  2 tablet Oral BID  . tamsulosin  0.4 mg Oral QHS  . triamcinolone  1 spray Each Nare QHS  . warfarin   Does not apply Once  . Warfarin - Pharmacist Dosing Inpatient   Does not apply q1800   Infusions:    Assessment: 69 yo male is currently on slightly supratherapeutic coumadin for VTE prophylaxis.  INR today is 2.68 from 2.12; INR is fluctuating.   Goal of Therapy:  INR 2-2.5 per MD's request Monitor platelets by anticoagulation protocol: Yes   Plan:  - coumadin 3 mg po x1 - INR in am  Cahlil Sattar, Tsz-Yin 04/06/2015,8:20 AM

## 2015-04-06 NOTE — Discharge Summary (Signed)
Physician Discharge Summary  Patient ID: Cody Hodge MRN: [id number] DOB/AGE: [birth year] 69 y.o.  Admit date: 04/01/2015 Discharge date: 04/06/2015  Discharge Diagnoses:  Principal Problem:   Status post total bilateral knee replacement Active Problems:   HTN (hypertension)   OA (osteoarthritis) of knee   Acute blood loss anemia   Dehydration   Constipation   Discharged Condition: stable.   Significant Diagnostic Studies: No results found.  Labs:  Basic Metabolic Panel: BMP Latest Ref Rng 04/01/2015 03/31/2015 03/30/2015  Glucose 65 - 99 mg/dL 126(H) 135(H) 150(H)  BUN 6 - 20 mg/dL 21(H) 24(H) 16  Creatinine 0.61 - 1.24 mg/dL 0.93 0.99 0.96  Sodium 135 - 145 mmol/L 137 137 137  Potassium 3.5 - 5.1 mmol/L 3.9 3.9 3.8  Chloride 101 - 111 mmol/L 100(L) 101 104  CO2 22 - 32 mmol/L 31 28 24   Calcium 8.9 - 10.3 mg/dL 8.6(L) 8.6(L) 8.4(L)     CBC: CBC Latest Ref Rng 04/03/2015 04/01/2015 04/01/2015  WBC 4.0 - 10.5 K/uL 10.7(H) 11.3(H) 14.6(H)  Hemoglobin 13.0 - 17.0 g/dL 8.8(L) 8.3(L) 8.4(L)  Hematocrit 39.0 - 52.0 % 26.3(L) 25.1(L) 25.9(L)  Platelets 150 - 400 K/uL 328 234 240      Brief HPI:   Cody Hodge is a 69 y.o. male with history of HTN, CAD, OSA, bilaterl knee pain due to endstage OA. Patient elected to undergo B-TKR on 03/29/15 by Dr. Wynelle Link. Post op WBAT with CPM used for PROM. Post op with ABLA and leucocytosis with WBC up to 21.7. PT/OT ongoing and CIR was recommended for follow up therapy   Hospital Course: Efrain Clauson was admitted to rehab 04/01/2015 for inpatient therapies to consist of PT and OT at least three hours five days a week. Past admission physiatrist, therapy team and rehab RN have worked together to provide customized collaborative inpatient rehab.   He was started on Oxcontin at during day to help manage pain. He was cross covered till Lovenox till INR was therapeutic.  He continues on coumadin for DVT prophylaxis and INR is therapeutic  at discharge. Cody Hodge is to manage and adjust coumadin per ortho protocol.  CBC has been monitored with routine checks and is showing recovery. Patient declined iron supplement due to concerns of increased constipation.  Bilateral knee incisions have healed well without s/s of infection.  He continues to have moderate edema bilaterally and TED were used for edema control.  Lytes have been monitored and patient was encouraged to push fluids to help with dehydration.  He has made steady progress during his rehab stay and is independent at discharge. He will continue to receive follow up HHPT and [facility] after discharge.      Rehab course: During patient's stay in rehab weekly team conferences were held to monitor patient's progress, set goals and discuss barriers to discharge. At admission, patient required min assist with mobility and basic self care needs. He has had improvement in activity tolerance, balance, postural control, as well as ability to compensate for deficits.  He is able to complete ADL tasks independently. He is independent for mobility and is ambulating > 300' with RW. He is able to climb 4 stairs with one rail independently.     Disposition: 01-Home or Self Care   Diet: Regular  Special Instructions: 1. Wound Care: Wash with soap and water. Pat dry. NO creams, ointments or lotions. Contact MD if you notice any redness, swelling. drainage or develop fever or chills.  2. No driving till cleared by MD.     Medication List    STOP taking these medications        enoxaparin 30 MG/0.3ML injection  Commonly known as:  LOVENOX     ibuprofen 200 MG tablet  Commonly known as:  ADVIL,MOTRIN     OVER THE COUNTER MEDICATION     XARELTO 20 MG Tabs tablet  Generic drug:  rivaroxaban      TAKE these medications        acetaminophen 325 MG tablet  Commonly known as:  TYLENOL  Take 1-2 tablets (325-650 mg total) by mouth every 4 (four) hours as needed  for mild pain.     atorvastatin 20 MG tablet  Commonly known as:  LIPITOR  Take 20 mg by mouth daily at 6 PM.     CoQ10 100 MG Caps  Take 200 mg by mouth at bedtime.     diltiazem 120 MG 24 hr capsule  Commonly known as:  CARDIZEM CD  Take 1 capsule (120 mg total) by mouth daily.     docusate sodium 100 MG capsule  Commonly known as:  COLACE  Take 1 capsule (100 mg total) by mouth 2 (two) times daily.     finasteride 5 MG tablet  Commonly known as:  PROSCAR  Take 5 mg by mouth daily.     flecainide 150 MG tablet  Commonly known as:  TAMBOCOR  Take 1 tablet (150 mg total) by mouth 2 (two) times daily.     flecainide 100 MG tablet  Commonly known as:  TAMBOCOR  Take 100 mg by mouth 2 (two) times daily.     methocarbamol 500 MG tablet  Commonly known as:  ROBAXIN  Take 1 tablet (500 mg total) by mouth every 6 (six) hours as needed for muscle spasms.     oxyCODONE 5 MG immediate release tablet--Rx # 45 pills   Commonly known as:  Oxy IR/ROXICODONE  Take 1-2 tablets (5-10 mg total) by mouth every 6 (six) hours as needed for breakthrough pain.     polyethylene glycol packet  Commonly known as:  MIRALAX / GLYCOLAX  Take 17 g by mouth daily.     senna-docusate 8.6-50 MG per tablet  Commonly known as:  Senokot-S  Take 2 tablets by mouth 2 (two) times daily.     SYSTANE OP  Apply 1 drop to eye 2 (two) times daily.     tamsulosin 0.4 MG Caps capsule  Commonly known as:  FLOMAX  Take 0.4 mg by mouth at bedtime.     traMADol 50 MG tablet--Rx # 60 pills  Commonly known as:  ULTRAM  Take 1-2 tablets (50-100 mg total) by mouth every 6 (six) hours as needed for moderate pain.     triamcinolone 55 MCG/ACT Aero nasal inhaler  Commonly known as:  NASACORT  Place 1 spray into both nostrils at bedtime.     TURMERIC PO  Take 400 mg by mouth 2 (two) times daily.     warfarin 2 MG tablet  Commonly known as:  COUMADIN  Take 1.5 tablets (3 mg total) by mouth daily at 6 PM.        Follow-up Information    Follow up with Donnie Coffin, MD. Call today.   Specialty:  Family Medicine   Why:  for post hospital follow up and check of CBC/anemia in 2 weeks.    Contact information:   [address] [place]  [place] [address] [phone]       Follow up with Gearlean Alf, MD. Call today.   Specialty:  Orthopedic Surgery   Why:  for post op appointment in next 7-10 days   Contact information:   [address] [place] [address] [phone]       Call Charlett Blake, MD.   Specialty:  Physical Medicine and Rehabilitation   Why:  As needed   Contact information:   [place] [address] [phone]       Signed: Bary Leriche 04/11/2015, 3:55 PM

## 2015-04-08 DIAGNOSIS — M1990 Unspecified osteoarthritis, unspecified site: Secondary | ICD-10-CM | POA: Diagnosis not present

## 2015-04-08 DIAGNOSIS — I2510 Atherosclerotic heart disease of native coronary artery without angina pectoris: Secondary | ICD-10-CM | POA: Diagnosis not present

## 2015-04-08 DIAGNOSIS — I480 Paroxysmal atrial fibrillation: Secondary | ICD-10-CM | POA: Diagnosis not present

## 2015-04-08 DIAGNOSIS — R251 Tremor, unspecified: Secondary | ICD-10-CM | POA: Diagnosis not present

## 2015-04-08 DIAGNOSIS — I10 Essential (primary) hypertension: Secondary | ICD-10-CM | POA: Diagnosis not present

## 2015-04-08 DIAGNOSIS — Z471 Aftercare following joint replacement surgery: Secondary | ICD-10-CM | POA: Diagnosis not present

## 2015-04-11 DIAGNOSIS — M1990 Unspecified osteoarthritis, unspecified site: Secondary | ICD-10-CM | POA: Diagnosis not present

## 2015-04-11 DIAGNOSIS — K5900 Constipation, unspecified: Secondary | ICD-10-CM | POA: Diagnosis present

## 2015-04-11 DIAGNOSIS — Z471 Aftercare following joint replacement surgery: Secondary | ICD-10-CM | POA: Diagnosis not present

## 2015-04-11 DIAGNOSIS — I480 Paroxysmal atrial fibrillation: Secondary | ICD-10-CM | POA: Diagnosis not present

## 2015-04-11 DIAGNOSIS — R251 Tremor, unspecified: Secondary | ICD-10-CM | POA: Diagnosis not present

## 2015-04-11 DIAGNOSIS — I2510 Atherosclerotic heart disease of native coronary artery without angina pectoris: Secondary | ICD-10-CM | POA: Diagnosis not present

## 2015-04-11 DIAGNOSIS — E860 Dehydration: Secondary | ICD-10-CM | POA: Diagnosis present

## 2015-04-11 DIAGNOSIS — I10 Essential (primary) hypertension: Secondary | ICD-10-CM | POA: Diagnosis not present

## 2015-04-11 DIAGNOSIS — D62 Acute posthemorrhagic anemia: Secondary | ICD-10-CM | POA: Diagnosis present

## 2015-04-12 DIAGNOSIS — R251 Tremor, unspecified: Secondary | ICD-10-CM | POA: Diagnosis not present

## 2015-04-12 DIAGNOSIS — I2510 Atherosclerotic heart disease of native coronary artery without angina pectoris: Secondary | ICD-10-CM | POA: Diagnosis not present

## 2015-04-12 DIAGNOSIS — Z471 Aftercare following joint replacement surgery: Secondary | ICD-10-CM | POA: Diagnosis not present

## 2015-04-12 DIAGNOSIS — I480 Paroxysmal atrial fibrillation: Secondary | ICD-10-CM | POA: Diagnosis not present

## 2015-04-12 DIAGNOSIS — M1990 Unspecified osteoarthritis, unspecified site: Secondary | ICD-10-CM | POA: Diagnosis not present

## 2015-04-12 DIAGNOSIS — I10 Essential (primary) hypertension: Secondary | ICD-10-CM | POA: Diagnosis not present

## 2015-04-13 DIAGNOSIS — Z96653 Presence of artificial knee joint, bilateral: Secondary | ICD-10-CM | POA: Diagnosis not present

## 2015-04-13 DIAGNOSIS — E780 Pure hypercholesterolemia: Secondary | ICD-10-CM | POA: Diagnosis not present

## 2015-04-13 DIAGNOSIS — M1990 Unspecified osteoarthritis, unspecified site: Secondary | ICD-10-CM | POA: Diagnosis not present

## 2015-04-13 DIAGNOSIS — Z471 Aftercare following joint replacement surgery: Secondary | ICD-10-CM | POA: Diagnosis not present

## 2015-04-13 DIAGNOSIS — R251 Tremor, unspecified: Secondary | ICD-10-CM | POA: Diagnosis not present

## 2015-04-13 DIAGNOSIS — I10 Essential (primary) hypertension: Secondary | ICD-10-CM | POA: Diagnosis not present

## 2015-04-13 DIAGNOSIS — I2510 Atherosclerotic heart disease of native coronary artery without angina pectoris: Secondary | ICD-10-CM | POA: Diagnosis not present

## 2015-04-13 DIAGNOSIS — I480 Paroxysmal atrial fibrillation: Secondary | ICD-10-CM | POA: Diagnosis not present

## 2015-04-14 DIAGNOSIS — I10 Essential (primary) hypertension: Secondary | ICD-10-CM | POA: Diagnosis not present

## 2015-04-14 DIAGNOSIS — R251 Tremor, unspecified: Secondary | ICD-10-CM | POA: Diagnosis not present

## 2015-04-14 DIAGNOSIS — I2510 Atherosclerotic heart disease of native coronary artery without angina pectoris: Secondary | ICD-10-CM | POA: Diagnosis not present

## 2015-04-14 DIAGNOSIS — I480 Paroxysmal atrial fibrillation: Secondary | ICD-10-CM | POA: Diagnosis not present

## 2015-04-14 DIAGNOSIS — Z471 Aftercare following joint replacement surgery: Secondary | ICD-10-CM | POA: Diagnosis not present

## 2015-04-14 DIAGNOSIS — M1990 Unspecified osteoarthritis, unspecified site: Secondary | ICD-10-CM | POA: Diagnosis not present

## 2015-04-18 DIAGNOSIS — I10 Essential (primary) hypertension: Secondary | ICD-10-CM | POA: Diagnosis not present

## 2015-04-18 DIAGNOSIS — I480 Paroxysmal atrial fibrillation: Secondary | ICD-10-CM | POA: Diagnosis not present

## 2015-04-18 DIAGNOSIS — R251 Tremor, unspecified: Secondary | ICD-10-CM | POA: Diagnosis not present

## 2015-04-18 DIAGNOSIS — I2510 Atherosclerotic heart disease of native coronary artery without angina pectoris: Secondary | ICD-10-CM | POA: Diagnosis not present

## 2015-04-18 DIAGNOSIS — Z471 Aftercare following joint replacement surgery: Secondary | ICD-10-CM | POA: Diagnosis not present

## 2015-04-18 DIAGNOSIS — M1990 Unspecified osteoarthritis, unspecified site: Secondary | ICD-10-CM | POA: Diagnosis not present

## 2015-04-19 DIAGNOSIS — R251 Tremor, unspecified: Secondary | ICD-10-CM | POA: Diagnosis not present

## 2015-04-19 DIAGNOSIS — I480 Paroxysmal atrial fibrillation: Secondary | ICD-10-CM | POA: Diagnosis not present

## 2015-04-19 DIAGNOSIS — I10 Essential (primary) hypertension: Secondary | ICD-10-CM | POA: Diagnosis not present

## 2015-04-19 DIAGNOSIS — Z471 Aftercare following joint replacement surgery: Secondary | ICD-10-CM | POA: Diagnosis not present

## 2015-04-19 DIAGNOSIS — I2510 Atherosclerotic heart disease of native coronary artery without angina pectoris: Secondary | ICD-10-CM | POA: Diagnosis not present

## 2015-04-19 DIAGNOSIS — M1990 Unspecified osteoarthritis, unspecified site: Secondary | ICD-10-CM | POA: Diagnosis not present

## 2015-04-20 DIAGNOSIS — I10 Essential (primary) hypertension: Secondary | ICD-10-CM | POA: Diagnosis not present

## 2015-04-20 DIAGNOSIS — I480 Paroxysmal atrial fibrillation: Secondary | ICD-10-CM | POA: Diagnosis not present

## 2015-04-20 DIAGNOSIS — I2510 Atherosclerotic heart disease of native coronary artery without angina pectoris: Secondary | ICD-10-CM | POA: Diagnosis not present

## 2015-04-20 DIAGNOSIS — Z471 Aftercare following joint replacement surgery: Secondary | ICD-10-CM | POA: Diagnosis not present

## 2015-04-20 DIAGNOSIS — M1990 Unspecified osteoarthritis, unspecified site: Secondary | ICD-10-CM | POA: Diagnosis not present

## 2015-04-20 DIAGNOSIS — R251 Tremor, unspecified: Secondary | ICD-10-CM | POA: Diagnosis not present

## 2015-04-21 DIAGNOSIS — R251 Tremor, unspecified: Secondary | ICD-10-CM | POA: Diagnosis not present

## 2015-04-21 DIAGNOSIS — I2510 Atherosclerotic heart disease of native coronary artery without angina pectoris: Secondary | ICD-10-CM | POA: Diagnosis not present

## 2015-04-21 DIAGNOSIS — I10 Essential (primary) hypertension: Secondary | ICD-10-CM | POA: Diagnosis not present

## 2015-04-21 DIAGNOSIS — M1990 Unspecified osteoarthritis, unspecified site: Secondary | ICD-10-CM | POA: Diagnosis not present

## 2015-04-21 DIAGNOSIS — I480 Paroxysmal atrial fibrillation: Secondary | ICD-10-CM | POA: Diagnosis not present

## 2015-04-21 DIAGNOSIS — Z471 Aftercare following joint replacement surgery: Secondary | ICD-10-CM | POA: Diagnosis not present

## 2015-04-25 DIAGNOSIS — M170 Bilateral primary osteoarthritis of knee: Secondary | ICD-10-CM | POA: Diagnosis not present

## 2015-04-27 DIAGNOSIS — M170 Bilateral primary osteoarthritis of knee: Secondary | ICD-10-CM | POA: Diagnosis not present

## 2015-04-27 NOTE — Discharge Summary (Signed)
Physician Discharge Summary   Patient ID: Cody Hodge MRN: [id number] DOB/AGE: 1945/10/07 69 y.o.  Admit date: 03/29/2015 Discharge date: 04/01/2015  Primary Diagnosis:  Osteoarthritis Bilateral knee(s)  Admission Diagnoses:  Past Medical History  Diagnosis Date  . Tremor   . Knee arthropathy   . OSA (obstructive sleep apnea)     mild to moderate refused CPAP  . Mixed hyperlipidemia   . Hypercholesteremia   . Melanoma   . PAF (paroxysmal atrial fibrillation)     CHADS VASC score 2 (age>65 and HTN)  . Arthritis   . ED (erectile dysfunction)   . HTN (hypertension)   . BPH (benign prostatic hyperplasia)   . Melanoma   . Coronary artery disease 09/2014    50% mid to distal LAD, 50-70% ostial first diag, 40% ostial and mid left circ.  Marland Kitchen Hemorrhoids     associated with bowel movements   Discharge Diagnoses:   Principal Problem:   OA (osteoarthritis) of knee Active Problems:   HTN (hypertension)   Postoperative anemia due to acute blood loss   Leucocytosis  Estimated body mass index is 27.84 kg/(m^2) as calculated from the following:   Height as of this encounter: $RemoveBeforeD'5\' 10"'[id number]$  (1.778 m).   Weight as of this encounter: 87.998 kg (194 lb).  Procedure:  Procedure(s) (LRB): TOTAL KNEE BILATERAL (Bilateral)   Consults: [facility]  HPI: Cody Hodge is a 69 y.o. year old male with end stage OA of both knees with progressively worsening pain and dysfunction. He has constant pain, with activity and at rest and significant functional deficits with difficulties even with ADLs. He has had extensive non-op management including analgesics, injections of cortisone and viscosupplements, and home exercise program, but remains in significant pain with significant dysfunction. We discussed replacing both knees in the same setting versus one at a time including procedure, risks, potential complications, rehab course, and pros and cons associated with each and the patient elects to  do both knees at the same time. He presents now for BilateralTotal Knee Arthroplasty.   Laboratory Data: Admission on 03/29/2015, Discharged on 04/01/2015  Component Date Value Ref Range Status  . WBC 03/30/2015 14.6* 4.0 - 10.5 K/uL Final  . RBC 03/30/2015 3.91* 4.22 - 5.81 MIL/uL Final  . Hemoglobin 03/30/2015 10.8* 13.0 - 17.0 g/dL Final  . HCT 03/30/2015 33.3* 39.0 - 52.0 % Final  . MCV 03/30/2015 85.2  78.0 - 100.0 fL Final  . MCH 03/30/2015 27.6  26.0 - 34.0 pg Final  . MCHC 03/30/2015 32.4  30.0 - 36.0 g/dL Final  . RDW 03/30/2015 13.5  11.5 - 15.5 % Final  . Platelets 03/30/2015 259  150 - 400 K/uL Final  . Sodium 03/30/2015 137  135 - 145 mmol/L Final  . Potassium 03/30/2015 3.8  3.5 - 5.1 mmol/L Final  . Chloride 03/30/2015 104  101 - 111 mmol/L Final  . CO2 03/30/2015 24  22 - 32 mmol/L Final  . Glucose, Bld 03/30/2015 150* 65 - 99 mg/dL Final  . BUN 03/30/2015 16  6 - 20 mg/dL Final  . Creatinine, Ser 03/30/2015 0.96  0.61 - 1.24 mg/dL Final  . Calcium 03/30/2015 8.4* 8.9 - 10.3 mg/dL Final  . GFR calc non Af Amer 03/30/2015 >60  >60 mL/min Final  . GFR calc Af Amer 03/30/2015 >60  >60 mL/min Final   Comment: (NOTE) The eGFR has been calculated using the CKD EPI equation. This calculation has not been validated in all clinical situations.  eGFR's persistently <60 mL/min signify possible Chronic Kidney Disease.   . Anion gap 03/30/2015 9  5 - 15 Final  . Prothrombin Time 03/30/2015 15.1  11.6 - 15.2 seconds Final  . INR 03/30/2015 1.17  0.00 - 1.49 Final  . WBC 03/31/2015 21.7* 4.0 - 10.5 K/uL Final  . RBC 03/31/2015 3.49* 4.22 - 5.81 MIL/uL Final  . Hemoglobin 03/31/2015 9.6* 13.0 - 17.0 g/dL Final  . HCT 03/31/2015 29.1* 39.0 - 52.0 % Final  . MCV 03/31/2015 83.4  78.0 - 100.0 fL Final  . MCH 03/31/2015 27.5  26.0 - 34.0 pg Final  . MCHC 03/31/2015 33.0  30.0 - 36.0 g/dL Final  . RDW 03/31/2015 13.7  11.5 - 15.5 % Final  . Platelets 03/31/2015 270  150 - 400 K/uL  Final  . Sodium 03/31/2015 137  135 - 145 mmol/L Final  . Potassium 03/31/2015 3.9  3.5 - 5.1 mmol/L Final  . Chloride 03/31/2015 101  101 - 111 mmol/L Final  . CO2 03/31/2015 28  22 - 32 mmol/L Final  . Glucose, Bld 03/31/2015 135* 65 - 99 mg/dL Final  . BUN 03/31/2015 24* 6 - 20 mg/dL Final  . Creatinine, Ser 03/31/2015 0.99  0.61 - 1.24 mg/dL Final  . Calcium 03/31/2015 8.6* 8.9 - 10.3 mg/dL Final  . GFR calc non Af Amer 03/31/2015 >60  >60 mL/min Final  . GFR calc Af Amer 03/31/2015 >60  >60 mL/min Final   Comment: (NOTE) The eGFR has been calculated using the CKD EPI equation. This calculation has not been validated in all clinical situations. eGFR's persistently <60 mL/min signify possible Chronic Kidney Disease.   . Anion gap 03/31/2015 8  5 - 15 Final  . Prothrombin Time 03/31/2015 15.8* 11.6 - 15.2 seconds Final  . INR 03/31/2015 1.25  0.00 - 1.49 Final  . WBC 04/01/2015 14.6* 4.0 - 10.5 K/uL Final  . RBC 04/01/2015 3.09* 4.22 - 5.81 MIL/uL Final  . Hemoglobin 04/01/2015 8.4* 13.0 - 17.0 g/dL Final  . HCT 04/01/2015 25.9* 39.0 - 52.0 % Final  . MCV 04/01/2015 83.8  78.0 - 100.0 fL Final  . MCH 04/01/2015 27.2  26.0 - 34.0 pg Final  . MCHC 04/01/2015 32.4  30.0 - 36.0 g/dL Final  . RDW 04/01/2015 13.8  11.5 - 15.5 % Final  . Platelets 04/01/2015 240  150 - 400 K/uL Final  . Prothrombin Time 04/01/2015 14.8  11.6 - 15.2 seconds Final  . INR 04/01/2015 1.14  0.00 - 1.49 Final  Hospital Outpatient Visit on 03/23/2015  Component Date Value Ref Range Status  . MRSA, PCR 03/23/2015 NEGATIVE  NEGATIVE Final  . Staphylococcus aureus 03/23/2015 POSITIVE* NEGATIVE Final   Comment:        The Xpert SA Assay (FDA approved for NASAL specimens in patients over 35 years of age), is one component of a comprehensive surveillance program.  Test performance has been validated by [facility] for patients greater than or equal to 8 year old. It is not intended to diagnose infection  nor to guide or monitor treatment.   Marland Kitchen aPTT 03/23/2015 28  24 - 37 seconds Final  . WBC 03/23/2015 8.6  4.0 - 10.5 K/uL Final  . RBC 03/23/2015 4.89  4.22 - 5.81 MIL/uL Final  . Hemoglobin 03/23/2015 13.5  13.0 - 17.0 g/dL Final  . HCT 03/23/2015 41.1  39.0 - 52.0 % Final  . MCV 03/23/2015 84.0  78.0 - 100.0 fL Final  .  [place] 03/23/2015 27.6  26.0 - 34.0 pg Final  . MCHC 03/23/2015 32.8  30.0 - 36.0 g/dL Final  . RDW 03/23/2015 13.7  11.5 - 15.5 % Final  . Platelets 03/23/2015 259  150 - 400 K/uL Final  . Sodium 03/23/2015 139  135 - 145 mmol/L Final  . Potassium 03/23/2015 4.6  3.5 - 5.1 mmol/L Final  . Chloride 03/23/2015 102  101 - 111 mmol/L Final  . CO2 03/23/2015 32  22 - 32 mmol/L Final  . Glucose, Bld 03/23/2015 87  65 - 99 mg/dL Final  . BUN 03/23/2015 18  6 - 20 mg/dL Final  . Creatinine, Ser 03/23/2015 1.13  0.61 - 1.24 mg/dL Final  . Calcium 03/23/2015 9.4  8.9 - 10.3 mg/dL Final  . Total Protein 03/23/2015 6.8  6.5 - 8.1 g/dL Final  . Albumin 03/23/2015 4.1  3.5 - 5.0 g/dL Final  . AST 03/23/2015 31  15 - 41 U/L Final  . ALT 03/23/2015 41  17 - 63 U/L Final  . Alkaline Phosphatase 03/23/2015 101  38 - 126 U/L Final  . Total Bilirubin 03/23/2015 0.5  0.3 - 1.2 mg/dL Final  . GFR calc non Af Amer 03/23/2015 >60  >60 mL/min Final  . GFR calc Af Amer 03/23/2015 >60  >60 mL/min Final   Comment: (NOTE) The eGFR has been calculated using the CKD EPI equation. This calculation has not been validated in all clinical situations. eGFR's persistently <60 mL/min signify possible Chronic Kidney Disease.   . Anion gap 03/23/2015 5  5 - 15 Final  . Prothrombin Time 03/23/2015 14.8  11.6 - 15.2 seconds Final  . INR 03/23/2015 1.14  0.00 - 1.49 Final  . ABO/RH(D) 03/23/2015 A NEG   Final  . Antibody Screen 03/23/2015 NEG   Final  . Sample Expiration 03/23/2015 04/01/2015   Final  . Color, Urine 03/23/2015 AMBER* YELLOW Final   BIOCHEMICALS MAY BE AFFECTED BY COLOR  . APPearance  03/23/2015 CLEAR  CLEAR Final  . Specific Gravity, Urine 03/23/2015 1.025  1.005 - 1.030 Final  . pH 03/23/2015 7.5  5.0 - 8.0 Final  . Glucose, UA 03/23/2015 NEGATIVE  NEGATIVE mg/dL Final  . Hgb urine dipstick 03/23/2015 NEGATIVE  NEGATIVE Final  . Bilirubin Urine 03/23/2015 NEGATIVE  NEGATIVE Final  . Ketones, ur 03/23/2015 NEGATIVE  NEGATIVE mg/dL Final  . Protein, ur 03/23/2015 NEGATIVE  NEGATIVE mg/dL Final  . Urobilinogen, UA 03/23/2015 0.2  0.0 - 1.0 mg/dL Final  . Nitrite 03/23/2015 NEGATIVE  NEGATIVE Final  . Leukocytes, UA 03/23/2015 NEGATIVE  NEGATIVE Final   MICROSCOPIC NOT DONE ON URINES WITH NEGATIVE PROTEIN, BLOOD, LEUKOCYTES, NITRITE, OR GLUCOSE <1000 mg/dL.  . ABO/RH(D) 03/23/2015 A NEG   Final     X-Rays:No results found.  EKG: Orders placed or performed in visit on 09/22/14  . Cardiac event monitor     Hospital Course: Patient was admitted to [facility] and taken to the OR and underwent the above stated procedure well without complications.  Patient tolerated the procedure well and was later transferred to the recovery room and then to the orthopaedic floor for postoperative care. Anesthesia was consulted postoperatively to place an epidural in for postoperative pain management. The patient was also given PO and IV analgesics for pain control following their surgery.  They were given 24 hours of postoperative antibiotics and started on DVT prophylaxis in the form of Lovenox Coumadin after the epidural had been removed.   PT  and OT were ordered for total joint protocol.  Discharge planning consulted to help with postop disposition and equipment needs.  Patient had a good night on the evening of surgery and started to get up OOB with therapy on day one. Hemovac drains were pulled without difficulty on day one.  Continued to work with therapy into day two.  Dressings were changed on day two and both incisions were healing well.  The epidural was removed without  difficulty by Anesthesia on day two.  By day three, the patient started to show progress with therapy.  Patient was seen initally postop by CIR and felt to be a good candidate.  By day three, arrangements had been made and he was ab le to transfer to CIR.   Take Coumadin for 4 weeks and then discontinue.  The dose may need to be adjusted based upon the INR.  Please follow the INR and titrate Coumadin dose for a therapeutic range between 2.0 and 3.0 INR.  After completing the 4 weeks of Coumadin, the patient may stop the Coumadin and resume their 81 mg Aspirin daily.  Continue Lovenox injections until the INR is therapeutic at or greater than 2.0.  When INR reaches the therapeutic level of equal to or greater than 2.0, the patient may discontinue the Lovenox injections.   Diet: Cardiac diet Activity:WBAT Follow-up:in 2 weeks Disposition - CIR Discharged Condition: good   Discharge Instructions    Call MD / Call 911    Complete by:  As directed   If you experience chest pain or shortness of breath, CALL 911 and be transported to the hospital emergency room.  If you develope a fever above 101 F, pus (white drainage) or increased drainage or redness at the wound, or calf pain, call your surgeon's office.     Constipation Prevention    Complete by:  As directed   Drink plenty of fluids.  Prune juice may be helpful.  You may use a stool softener, such as Colace (over the counter) 100 mg twice a day.  Use MiraLax (over the counter) for constipation as needed.     Diet - low sodium heart healthy    Complete by:  As directed      Discharge instructions    Complete by:  As directed   INSTRUCTIONS AFTER JOINT REPLACEMENT   Remove items at home which could result in a fall. This includes throw rugs or furniture in walking pathways ICE to the affected joint every three hours while awake for 30 minutes at a time, for at least the first 3-5 days, and then as needed for pain and swelling.  Continue to  use ice for pain and swelling. You may notice swelling that will progress down to the foot and ankle.  This is normal after surgery.  Elevate your leg when you are not up walking on it.   Continue to use the breathing machine you got in the hospital (incentive spirometer) which will help keep your temperature down.  It is common for your temperature to cycle up and down following surgery, especially at night when you are not up moving around and exerting yourself.  The breathing machine keeps your lungs expanded and your temperature down.   DIET:  As you were doing prior to hospitalization, we recommend a well-balanced diet.  DRESSING / WOUND CARE / SHOWERING  You may change the dressing every day with sterile gauze.  Please use good hand washing techniques before changing the  dressing.  Do not use any lotions or creams on the incision until instructed by your surgeon.  ACTIVITY  Increase activity slowly as tolerated, but follow the weight bearing instructions below.   No driving for 6 weeks or until further direction given by your physician.  You cannot drive while taking narcotics.  No lifting or carrying greater than 10 lbs. until further directed by your surgeon. Avoid periods of inactivity such as sitting longer than an hour when not asleep. This helps prevent blood clots.  You may return to work once you are authorized by your doctor.     WEIGHT BEARING   Weight bearing as tolerated with assist device (walker, cane, etc) as directed, use it as long as suggested by your surgeon or therapist, typically at least 4-6 weeks.   EXERCISES  Results after joint replacement surgery are often greatly improved when you follow the exercise, range of motion and muscle strengthening exercises prescribed by your doctor. Safety measures are also important to protect the joint from further injury. Any time any of these exercises cause you to have increased pain or swelling, decrease what you are doing  until you are comfortable again and then slowly increase them. If you have problems or questions, call your caregiver or physical therapist for advice.   Rehabilitation is important following a joint replacement. After just a few days of immobilization, the muscles of the leg can become weakened and shrink (atrophy).  These exercises are designed to build up the tone and strength of the thigh and leg muscles and to improve motion. Often times heat used for twenty to thirty minutes before working out will loosen up your tissues and help with improving the range of motion but do not use heat for the first two weeks following surgery (sometimes heat can increase post-operative swelling).   These exercises can be done on a training (exercise) mat, on the floor, on a table or on a bed. Use whatever works the best and is most comfortable for you.    Use music or television while you are exercising so that the exercises are a pleasant break in your day. This will make your life better with the exercises acting as a break in your routine that you can look forward to.   Perform all exercises about fifteen times, three times per day or as directed.  You should exercise both the operative leg and the other leg as well.   Exercises include:   Quad Sets - Tighten up the muscle on the front of the thigh (Quad) and hold for 5-10 seconds.   Straight Leg Raises - With your knee straight (if you were given a brace, keep it on), lift the leg to 60 degrees, hold for 3 seconds, and slowly lower the leg.  Perform this exercise against resistance later as your leg gets stronger.  Leg Slides: Lying on your back, slowly slide your foot toward your buttocks, bending your knee up off the floor (only go as far as is comfortable). Then slowly slide your foot back down until your leg is flat on the floor again.  Angel Wings: Lying on your back spread your legs to the side as far apart as you can without causing discomfort.  Hamstring  Strength:  Lying on your back, push your heel against the floor with your leg straight by tightening up the muscles of your buttocks.  Repeat, but this time bend your knee to a comfortable angle, and push your heel  against the floor.  You may put a pillow under the heel to make it more comfortable if necessary.   A rehabilitation program following joint replacement surgery can speed recovery and prevent re-injury in the future due to weakened muscles. Contact your doctor or a physical therapist for more information on knee rehabilitation.    CONSTIPATION  Constipation is defined medically as fewer than three stools per week and severe constipation as less than one stool per week.  Even if you have a regular bowel pattern at home, your normal regimen is likely to be disrupted due to multiple reasons following surgery.  Combination of anesthesia, postoperative narcotics, change in appetite and fluid intake all can affect your bowels.   YOU MUST use at least one of the following options; they are listed in order of increasing strength to get the job done.  They are all available over the counter, and you may need to use some, POSSIBLY even all of these options:    Drink plenty of fluids (prune juice may be helpful) and high fiber foods Colace 100 mg by mouth twice a day  Senokot for constipation as directed and as needed Dulcolax (bisacodyl), take with full glass of water  Miralax (polyethylene glycol) once or twice a day as needed.  If you have tried all these things and are unable to have a bowel movement in the first 3-4 days after surgery call either your surgeon or your primary doctor.    If you experience loose stools or diarrhea, hold the medications until you stool forms back up.  If your symptoms do not get better within 1 week or if they get worse, check with your doctor.  If you experience "the worst abdominal pain ever" or develop nausea or vomiting, please contact the office immediately  for further recommendations for treatment.   ITCHING:  If you experience itching with your medications, try taking only a single pain pill, or even half a pain pill at a time.  You can also use Benadryl over the counter for itching or also to help with sleep.   TED HOSE STOCKINGS:  Use stockings on both legs until for at least 2 weeks or as directed by physician office. They may be removed at night for sleeping.  MEDICATIONS:  See your medication summary on the "After Visit Summary" that nursing will review with you.  You may have some home medications which will be placed on hold until you complete the course of blood thinner medication.  It is important for you to complete the blood thinner medication as prescribed.  PRECAUTIONS:  If you experience chest pain or shortness of breath - call 911 immediately for transfer to the hospital emergency department.   If you develop a fever greater that 101 F, purulent drainage from wound, increased redness or drainage from wound, foul odor from the wound/dressing, or calf pain - CONTACT YOUR SURGEON.                                                   FOLLOW-UP APPOINTMENTS:  If you do not already have a post-op appointment, please call the office for an appointment to be seen by your surgeon.  Guidelines for how soon to be seen are listed in your "After Visit Summary", but are typically between 1-4 weeks after surgery.  MAKE SURE YOU:  Understand these instructions.  Get help right away if you are not doing well or get worse.    Thank you for letting us be a part of your medical care team.  It is a privilege we respect greatly.  We hope these instructions will help you stay on track for a fast and full recovery!            Medication List    STOP taking these medications        flecainide 150 MG tablet  Commonly known as:  TAMBOCOR     ibuprofen 200 MG tablet  Commonly known as:  ADVIL,MOTRIN     OVER THE COUNTER MEDICATION      rivaroxaban 20 MG Tabs tablet  Commonly known as:  XARELTO      TAKE these medications        atorvastatin 20 MG tablet  Commonly known as:  LIPITOR  Take 20 mg by mouth daily at 6 PM.     CoQ10 100 MG Caps  Take 200 mg by mouth at bedtime.     diltiazem 120 MG 24 hr capsule  Commonly known as:  CARDIZEM CD  Take 1 capsule (120 mg total) by mouth daily.     finasteride 5 MG tablet  Commonly known as:  PROSCAR  Take 5 mg by mouth daily.     methocarbamol 500 MG tablet  Commonly known as:  ROBAXIN  Take 1 tablet (500 mg total) by mouth every 6 (six) hours as needed for muscle spasms.     tamsulosin 0.4 MG Caps capsule  Commonly known as:  FLOMAX  Take 0.4 mg by mouth at bedtime.     triamcinolone 55 MCG/ACT Aero nasal inhaler  Commonly known as:  NASACORT  Place 1 spray into both nostrils at bedtime.     TURMERIC PO  Take 400 mg by mouth 2 (two) times daily.           Follow-up Information    Follow up with Gearlean Alf, MD. Call today.   Specialty:  Orthopedic Surgery   Why:  for follow up appointment   Contact information:   [address] [place] [address] [phone]       Call Charlett Blake, MD.   Specialty:  Physical Medicine and Rehabilitation   Why:  As needed   Contact information:   [place] [address] [phone]       Signed: Arlee Muslim, PA-C Orthopaedic Surgery 04/27/2015, 9:13 AM

## 2015-05-01 DIAGNOSIS — M170 Bilateral primary osteoarthritis of knee: Secondary | ICD-10-CM | POA: Diagnosis not present

## 2015-05-02 DIAGNOSIS — M1711 Unilateral primary osteoarthritis, right knee: Secondary | ICD-10-CM | POA: Diagnosis not present

## 2015-05-02 DIAGNOSIS — Z96652 Presence of left artificial knee joint: Secondary | ICD-10-CM | POA: Diagnosis not present

## 2015-05-02 DIAGNOSIS — Z96651 Presence of right artificial knee joint: Secondary | ICD-10-CM | POA: Diagnosis not present

## 2015-05-02 DIAGNOSIS — Z96653 Presence of artificial knee joint, bilateral: Secondary | ICD-10-CM | POA: Diagnosis not present

## 2015-05-02 DIAGNOSIS — Z471 Aftercare following joint replacement surgery: Secondary | ICD-10-CM | POA: Diagnosis not present

## 2015-05-03 DIAGNOSIS — M170 Bilateral primary osteoarthritis of knee: Secondary | ICD-10-CM | POA: Diagnosis not present

## 2015-05-08 DIAGNOSIS — M170 Bilateral primary osteoarthritis of knee: Secondary | ICD-10-CM | POA: Diagnosis not present

## 2015-05-10 DIAGNOSIS — M170 Bilateral primary osteoarthritis of knee: Secondary | ICD-10-CM | POA: Diagnosis not present

## 2015-05-15 DIAGNOSIS — M170 Bilateral primary osteoarthritis of knee: Secondary | ICD-10-CM | POA: Diagnosis not present

## 2015-05-19 DIAGNOSIS — M170 Bilateral primary osteoarthritis of knee: Secondary | ICD-10-CM | POA: Diagnosis not present

## 2015-05-22 DIAGNOSIS — M170 Bilateral primary osteoarthritis of knee: Secondary | ICD-10-CM | POA: Diagnosis not present

## 2015-05-23 DIAGNOSIS — Z96652 Presence of left artificial knee joint: Secondary | ICD-10-CM | POA: Diagnosis not present

## 2015-05-23 DIAGNOSIS — Z96653 Presence of artificial knee joint, bilateral: Secondary | ICD-10-CM | POA: Diagnosis not present

## 2015-05-23 DIAGNOSIS — Z23 Encounter for immunization: Secondary | ICD-10-CM | POA: Diagnosis not present

## 2015-05-23 DIAGNOSIS — Z471 Aftercare following joint replacement surgery: Secondary | ICD-10-CM | POA: Diagnosis not present

## 2015-06-05 DIAGNOSIS — N401 Enlarged prostate with lower urinary tract symptoms: Secondary | ICD-10-CM | POA: Diagnosis not present

## 2015-06-06 DIAGNOSIS — Z471 Aftercare following joint replacement surgery: Secondary | ICD-10-CM | POA: Diagnosis not present

## 2015-06-06 DIAGNOSIS — Z96653 Presence of artificial knee joint, bilateral: Secondary | ICD-10-CM | POA: Diagnosis not present

## 2015-06-06 DIAGNOSIS — Z96652 Presence of left artificial knee joint: Secondary | ICD-10-CM | POA: Diagnosis not present

## 2015-06-07 DIAGNOSIS — L570 Actinic keratosis: Secondary | ICD-10-CM | POA: Diagnosis not present

## 2015-06-07 DIAGNOSIS — L821 Other seborrheic keratosis: Secondary | ICD-10-CM | POA: Diagnosis not present

## 2015-06-07 DIAGNOSIS — Z08 Encounter for follow-up examination after completed treatment for malignant neoplasm: Secondary | ICD-10-CM | POA: Diagnosis not present

## 2015-06-07 DIAGNOSIS — L814 Other melanin hyperpigmentation: Secondary | ICD-10-CM | POA: Diagnosis not present

## 2015-06-07 DIAGNOSIS — Z85820 Personal history of malignant melanoma of skin: Secondary | ICD-10-CM | POA: Diagnosis not present

## 2015-06-09 DIAGNOSIS — N138 Other obstructive and reflux uropathy: Secondary | ICD-10-CM | POA: Diagnosis not present

## 2015-06-09 DIAGNOSIS — R351 Nocturia: Secondary | ICD-10-CM | POA: Diagnosis not present

## 2015-06-09 DIAGNOSIS — N401 Enlarged prostate with lower urinary tract symptoms: Secondary | ICD-10-CM | POA: Diagnosis not present

## 2015-07-24 DIAGNOSIS — N401 Enlarged prostate with lower urinary tract symptoms: Secondary | ICD-10-CM | POA: Diagnosis not present

## 2015-07-24 DIAGNOSIS — R351 Nocturia: Secondary | ICD-10-CM | POA: Diagnosis not present

## 2015-07-25 DIAGNOSIS — Z96652 Presence of left artificial knee joint: Secondary | ICD-10-CM | POA: Diagnosis not present

## 2015-07-25 DIAGNOSIS — Z471 Aftercare following joint replacement surgery: Secondary | ICD-10-CM | POA: Diagnosis not present

## 2015-07-25 DIAGNOSIS — Z96653 Presence of artificial knee joint, bilateral: Secondary | ICD-10-CM | POA: Diagnosis not present

## 2015-08-15 ENCOUNTER — Other Ambulatory Visit: Payer: Self-pay | Admitting: Cardiology

## 2015-09-04 DIAGNOSIS — Z0000 Encounter for general adult medical examination without abnormal findings: Secondary | ICD-10-CM | POA: Diagnosis not present

## 2015-09-04 DIAGNOSIS — E7800 Pure hypercholesterolemia, unspecified: Secondary | ICD-10-CM | POA: Diagnosis not present

## 2015-09-21 DIAGNOSIS — Z96653 Presence of artificial knee joint, bilateral: Secondary | ICD-10-CM | POA: Diagnosis not present

## 2015-09-21 DIAGNOSIS — Z471 Aftercare following joint replacement surgery: Secondary | ICD-10-CM | POA: Diagnosis not present

## 2015-10-18 DIAGNOSIS — Z8679 Personal history of other diseases of the circulatory system: Secondary | ICD-10-CM | POA: Diagnosis not present

## 2015-10-18 DIAGNOSIS — E7800 Pure hypercholesterolemia, unspecified: Secondary | ICD-10-CM | POA: Diagnosis not present

## 2015-10-18 DIAGNOSIS — Z0000 Encounter for general adult medical examination without abnormal findings: Secondary | ICD-10-CM | POA: Diagnosis not present

## 2015-10-18 DIAGNOSIS — R351 Nocturia: Secondary | ICD-10-CM | POA: Diagnosis not present

## 2015-10-18 DIAGNOSIS — I2510 Atherosclerotic heart disease of native coronary artery without angina pectoris: Secondary | ICD-10-CM | POA: Diagnosis not present

## 2016-02-08 DIAGNOSIS — H25813 Combined forms of age-related cataract, bilateral: Secondary | ICD-10-CM | POA: Diagnosis not present

## 2016-02-08 DIAGNOSIS — H04123 Dry eye syndrome of bilateral lacrimal glands: Secondary | ICD-10-CM | POA: Diagnosis not present

## 2016-02-08 DIAGNOSIS — Z9889 Other specified postprocedural states: Secondary | ICD-10-CM | POA: Diagnosis not present

## 2016-02-28 DIAGNOSIS — L0390 Cellulitis, unspecified: Secondary | ICD-10-CM | POA: Diagnosis not present

## 2016-03-18 DIAGNOSIS — H2511 Age-related nuclear cataract, right eye: Secondary | ICD-10-CM | POA: Diagnosis not present

## 2016-04-11 DIAGNOSIS — I2510 Atherosclerotic heart disease of native coronary artery without angina pectoris: Secondary | ICD-10-CM | POA: Diagnosis not present

## 2016-04-11 DIAGNOSIS — E7800 Pure hypercholesterolemia, unspecified: Secondary | ICD-10-CM | POA: Diagnosis not present

## 2016-04-11 DIAGNOSIS — Z8679 Personal history of other diseases of the circulatory system: Secondary | ICD-10-CM | POA: Diagnosis not present

## 2016-04-12 DIAGNOSIS — I2510 Atherosclerotic heart disease of native coronary artery without angina pectoris: Secondary | ICD-10-CM | POA: Diagnosis not present

## 2016-05-21 DIAGNOSIS — H2512 Age-related nuclear cataract, left eye: Secondary | ICD-10-CM | POA: Diagnosis not present

## 2016-05-27 DIAGNOSIS — H2512 Age-related nuclear cataract, left eye: Secondary | ICD-10-CM | POA: Diagnosis not present

## 2016-06-03 DIAGNOSIS — L814 Other melanin hyperpigmentation: Secondary | ICD-10-CM | POA: Diagnosis not present

## 2016-06-03 DIAGNOSIS — Z85820 Personal history of malignant melanoma of skin: Secondary | ICD-10-CM | POA: Diagnosis not present

## 2016-06-03 DIAGNOSIS — L821 Other seborrheic keratosis: Secondary | ICD-10-CM | POA: Diagnosis not present

## 2016-06-03 DIAGNOSIS — D1801 Hemangioma of skin and subcutaneous tissue: Secondary | ICD-10-CM | POA: Diagnosis not present

## 2016-06-03 DIAGNOSIS — D225 Melanocytic nevi of trunk: Secondary | ICD-10-CM | POA: Diagnosis not present

## 2016-06-06 DIAGNOSIS — Z96652 Presence of left artificial knee joint: Secondary | ICD-10-CM | POA: Diagnosis not present

## 2016-06-06 DIAGNOSIS — Z96653 Presence of artificial knee joint, bilateral: Secondary | ICD-10-CM | POA: Diagnosis not present

## 2016-06-06 DIAGNOSIS — Z471 Aftercare following joint replacement surgery: Secondary | ICD-10-CM | POA: Diagnosis not present

## 2016-06-06 DIAGNOSIS — Z96651 Presence of right artificial knee joint: Secondary | ICD-10-CM | POA: Diagnosis not present

## 2016-06-12 DIAGNOSIS — I2510 Atherosclerotic heart disease of native coronary artery without angina pectoris: Secondary | ICD-10-CM | POA: Diagnosis not present

## 2016-06-12 DIAGNOSIS — E7800 Pure hypercholesterolemia, unspecified: Secondary | ICD-10-CM | POA: Diagnosis not present

## 2016-06-12 DIAGNOSIS — Z8679 Personal history of other diseases of the circulatory system: Secondary | ICD-10-CM | POA: Diagnosis not present

## 2016-06-12 DIAGNOSIS — Z23 Encounter for immunization: Secondary | ICD-10-CM | POA: Diagnosis not present

## 2016-10-30 DIAGNOSIS — Z0000 Encounter for general adult medical examination without abnormal findings: Secondary | ICD-10-CM | POA: Diagnosis not present

## 2016-10-30 DIAGNOSIS — E7800 Pure hypercholesterolemia, unspecified: Secondary | ICD-10-CM | POA: Diagnosis not present

## 2016-10-31 DIAGNOSIS — R972 Elevated prostate specific antigen [PSA]: Secondary | ICD-10-CM | POA: Diagnosis not present

## 2016-10-31 DIAGNOSIS — R351 Nocturia: Secondary | ICD-10-CM | POA: Diagnosis not present

## 2016-10-31 DIAGNOSIS — N401 Enlarged prostate with lower urinary tract symptoms: Secondary | ICD-10-CM | POA: Diagnosis not present

## 2016-10-31 DIAGNOSIS — N5201 Erectile dysfunction due to arterial insufficiency: Secondary | ICD-10-CM | POA: Diagnosis not present

## 2016-12-09 DIAGNOSIS — E7800 Pure hypercholesterolemia, unspecified: Secondary | ICD-10-CM | POA: Diagnosis not present

## 2016-12-09 DIAGNOSIS — Z8679 Personal history of other diseases of the circulatory system: Secondary | ICD-10-CM | POA: Diagnosis not present

## 2016-12-09 DIAGNOSIS — I2510 Atherosclerotic heart disease of native coronary artery without angina pectoris: Secondary | ICD-10-CM | POA: Diagnosis not present

## 2017-01-08 DIAGNOSIS — K644 Residual hemorrhoidal skin tags: Secondary | ICD-10-CM | POA: Diagnosis not present

## 2017-01-08 DIAGNOSIS — E7800 Pure hypercholesterolemia, unspecified: Secondary | ICD-10-CM | POA: Diagnosis not present

## 2017-01-08 DIAGNOSIS — I25119 Atherosclerotic heart disease of native coronary artery with unspecified angina pectoris: Secondary | ICD-10-CM | POA: Diagnosis not present

## 2017-01-08 DIAGNOSIS — I480 Paroxysmal atrial fibrillation: Secondary | ICD-10-CM | POA: Diagnosis not present

## 2017-01-13 DIAGNOSIS — I2510 Atherosclerotic heart disease of native coronary artery without angina pectoris: Secondary | ICD-10-CM | POA: Diagnosis not present

## 2017-01-13 DIAGNOSIS — R0602 Shortness of breath: Secondary | ICD-10-CM | POA: Diagnosis not present

## 2017-01-13 DIAGNOSIS — I200 Unstable angina: Secondary | ICD-10-CM | POA: Diagnosis not present

## 2017-01-15 DIAGNOSIS — D649 Anemia, unspecified: Secondary | ICD-10-CM | POA: Diagnosis not present

## 2017-01-15 DIAGNOSIS — R7309 Other abnormal glucose: Secondary | ICD-10-CM | POA: Diagnosis not present

## 2017-01-23 DIAGNOSIS — I25119 Atherosclerotic heart disease of native coronary artery with unspecified angina pectoris: Secondary | ICD-10-CM | POA: Diagnosis not present

## 2017-01-23 DIAGNOSIS — R0602 Shortness of breath: Secondary | ICD-10-CM | POA: Diagnosis not present

## 2017-01-24 DIAGNOSIS — K921 Melena: Secondary | ICD-10-CM | POA: Diagnosis not present

## 2017-02-03 DIAGNOSIS — E7800 Pure hypercholesterolemia, unspecified: Secondary | ICD-10-CM | POA: Diagnosis not present

## 2017-02-03 DIAGNOSIS — I480 Paroxysmal atrial fibrillation: Secondary | ICD-10-CM | POA: Diagnosis not present

## 2017-02-03 DIAGNOSIS — I25119 Atherosclerotic heart disease of native coronary artery with unspecified angina pectoris: Secondary | ICD-10-CM | POA: Diagnosis not present

## 2017-03-10 DIAGNOSIS — D649 Anemia, unspecified: Secondary | ICD-10-CM | POA: Diagnosis not present

## 2017-03-14 DIAGNOSIS — I25119 Atherosclerotic heart disease of native coronary artery with unspecified angina pectoris: Secondary | ICD-10-CM | POA: Diagnosis not present

## 2017-03-16 DIAGNOSIS — I209 Angina pectoris, unspecified: Secondary | ICD-10-CM

## 2017-03-16 NOTE — H&P (Addendum)
OFFICE VISIT NOTES COPIED TO EPIC FOR DOCUMENTATION  . History of Present Illness Cody Page MD; 02/03/2017 7:52 PM) Patient words: Last O/V 01/08/2017; F/U Nuc results.  The patient is a 70 year old male who presents for a Follow-up for Angina.  Additional reasons for visit:  Follow-up for Atrial fibrillation is described as the following: This was diagnosed as paroxysmal atrial fibrillation (appears to be diagnosis in 2008 by event monitor(no strips available) after he c/o palpitations and fatigue during tennis). Underwent coronary angio in Feb 2016 for mildly abnormal stress test showing apical ischemia. Found to have non critical moderate CAD. He has had occasional episodes of breakthrough atrial fibrillation per history but no documented EKG.  For symptoms suggestive of new onset angina pectoris that started 3 months ago, he underwent nuclear stress testing on 01/13/2017 which is markedly abnormal with positive EKG changes at 8 minutes into exercise along with moderate area of severe ischemia in the anterolateral and mid anterolateral myocardial walls. Echocardiogram revealed no evidence of wall motion abnormalities and preserved EF. He is accompanied by his wife at the bedside today.  He had been doing well until about 3 months ago he started noticing that his exercise capacity has reduced, he is been having exertional chest pain while playing tennis in the form of chest tightness and is associated with dyspnea and reduced exercise tolerance.   Problem List/Past Medical (April Garrison; 02/03/2017 2:03 PM) Arthritis of knee (M17.10)  ED (erectile dysfunction) (N52.9)  BPH (benign prostatic hyperplasia) (N40.0)  Hypercholesteremia (E78.00)  Labwork  12/08/2016: Cholesterol 133, triglycerides 59, HDL 51, LDL 70. TSH 1.3. Creatinine 1.02, potassium 4.6, CMP normal. RBC 4.07, hemoglobin 11.0, hematocrit 33.1, platelets 392, CBC otherwise normal. Labs 04/12/2016: Serum glucose 107  mg, nonfasting. BUN 35, serum creatinine 1.09, eGFR 79 mL. Potassium 4.6. Alkaline phosphatase minimally elevated at 120, probably normal. AST minimally elevated at 55, HB 13.5/HCT 39.1, platelets 307. 10/19/2015: Total cholesterol 151, triglycerides 95, HDL 48, LDL 83, creatinine 0.95, potassium 4.7, CMP normal 05/05/2014: Total cholesterol 182, triglycerides 116, HDL 54, LDL 105. Non-HDL cholesterol 129. BUN 20, serum creatinine 1.08, eGFR 68,. CMP otherwise normal. Hemorrhoids, external (K64.4)  PAF (paroxysmal atrial fibrillation) (I48.0) [06/03/2007]: CHA2DS2-VASc Score is 2 with yearly risk of stroke of 2.2%. HAS-Bled score is 1 and estimated major bleeding in one year is 1.02-1.5% Echocardiogram 01/23/2017: Left ventricle cavity is normal in size. Mild asymmetric hypertrophy of the left ventricle. Normal global wall motion. Visual EF is 50-55%. Normal diastolic filling pattern, normal LAP. Left atrial cavity is mildly dilated at 4.2 cm. Trace tricuspid regurgitation. Mild pulmonary hypertension. Pulmonary artery systolic pressure is estimated at 33 mm Hg. IVC is minimally dilated with blunted respiratory response. May suggest elevated central venous pressure. Compared to 07/70/2016, mild pulmonary hypertension new. Holter monitor 06/03/2007: Atrial fibrillation, IVCD. 2 episodes off atrial fibrillation, total duration 22 minutes. No documentation could be found (was being followed by Dr. Golden Hurter). Atherosclerosis of native coronary artery of native heart with angina pectoris (I25.119) [10/06/2014]: Exercise Myoview 01/13/2017: 1. The resting electrocardiogram demonstrated normal sinus rhythm, normal resting conduction, no resting arrhythmias and normal rest repolarization. The stress electrocardiogram was abnormal. Patient developed 2 mm inferior and lateral ST segment depression which persisted for greater than 3 minutes into recovery.Patient exercised on Bruce protocol for 8: 41mnutes and achieved  10.16 METS. Stress test terminated due to chest pain and 87% MPHR achieved (Target HR >85%). 2. There is a moderate area of severe  ischemia in the basal anterolateral, mid anterolateral and lateral myocardial wall(s). Overall left ventricular systolic function was normal with regional wall motion abnormalities. The left ventricular ejection fraction was calculated or visually estimated to be 65%. This is a high risk study, consider further cardiac work-up. Coronary angiogram 10/06/2014: Distal LAD 40-50% stenosis, first diagonal 50-70% stenosis. Obtuse marginal proximal 30-40%, ostial circumflex 30-40%. LVEF 55%.  Allergies (April Garrison; 03-05-17 2:03 PM) No Known Drug Allergies [04/13/2015]:  Family History (April Louretta Shorten; March 05, 2017 2:10 PM) Mother  Deceased. at age 15 from dementia; no heart attack or strokes, PVD but no other cardiovascular conditions Father  Deceased. at age 81 from sepsis; heart attack at 81 mild MI; no strokes, no other cardiovascular conditions Sister 1  8 yrs older; no other cardiovascular conditions; Alzheimers Sister 2  In good health. 4 yrs older; no cardiovascular conditions Sister 3  In good health. 1 1/2 yrs younger; no cardiovascular conditions  Social History (April Garrison; 2017/03/05 2:03 PM) Current tobacco use  Never smoker. Alcohol Use  Occasional alcohol use. 4 drinks per week Marital status  Married. Living Situation  Lives with spouse. Number of Children  3.  Past Surgical History (April Garrison; 03-05-17 2:03 PM) left and right arthroscopy  Arthroscopic Knee Surgery - Both [1996]: LASIK [2006]: left forearm melanoma [2010]: HYDROCELE EXCISION / REPAIR [2011]: Total Knee Replacement - Both [03/29/2015]: Dr Wynelle Link Cataract Extraction-Bilateral [05/2016]: Right in August 2017  Medication History (April Louretta Shorten; 03-05-2017 2:21 PM) Eliquis (5MG Tablet, 1 (one) Tablet Oral two times daily, Taken starting 01/08/2017)  Active. Atorvastatin Calcium (20MG Tablet, 1 (one) Tablet Tablet Tablet Oral daily, Taken starting 08/27/2016) Active. Flecainide Acetate (100MG Tablet, 1/2 tablet Tablet Tablet Table Oral two times daily, Taken starting 06/10/2016) Active. Finasteride (5MG Tablet, 1 Oral daily) Active. CoQ10 (1 Oral daily) Specific strength unknown - Active. Vitamin D (1000UNIT Tablet, 1 Oral daily) Active. Resveratrol (250MG Capsule, 2 Oral daily) Active. Advil (200MG Tablet, 2 Oral as needed) Active. Multivitamin Adults 50+ (1 Oral daily) Active. Turmeric (1 Oral two times daily) Specific strength unknown - Active. Iron (325 (65 Fe)MG Tablet, 1 Oral daily) Active. Medications Reconciled (Verbally - Pt brought list)  Diagnostic Studies History (April Garrison; 03-05-2017 2:05 PM) Echocardiogram [02/16/2015]: Left ventricle cavity is normal in size. Mild concentric hypertrophy of the left ventricle. Normal global wall motion. Normal diastolic filling pattern. Calculated EF 63%. Left atrial cavity is mildly dilated. The Doppler, spectral and color flow examinations are unremarkable and show no significant flow abnormalities of the aortic, mitral, tricuspid or pulmonic valves. Sleep Study [2011]: Told to be minimally abnormal. Colonoscopy [2015]: Normal Coronary Angiogram [10/06/2014]: 1. Minimal calcification throughout the left coronary distribution. 2. 50% mid to distal LAD, 50-70% ostial first diagonal, and 40% ostial and mid circumflex. The patient has no hemodynamically significant coronary obstructive disease. The myocardial perfusion study was low risk. The findings on both studies are compatible. 3. Normal left ventricular function Nuclear stress test [01/13/2017]: 1. The resting electrocardiogram demonstrated normal sinus rhythm, normal resting conduction, no resting arrhythmias and normal rest repolarization. The stress electrocardiogram was abnormal. Patient developed 2 mm inferior and  lateral ST segment depression which persisted for greater than 3 minutes into recovery.Patient exercised on Bruce protocol for 8: 56mnutes and achieved 10.16 METS. Stress test terminated due to chest pain and 87% MPHR achieved (Target HR >85%). 2. There is a moderate area of severe ischemia in the basal anterolateral, mid anterolateral and lateral myocardial wall(s). Overall left ventricular systolic  function was normal with regional wall motion abnormalities. The left ventricular ejection fraction was calculated or visually estimated to be 65%. This is a high risk study, consider further cardiac work-up.    Review of Systems Cody Page, MD; 02/03/2017 7:52 PM) General Present- Feeling well. Not Present- Fatigue, Fever and Night Sweats. Skin Not Present- Itching and Rash. HEENT Not Present- Headache. Respiratory Present- Decreased Exercise Tolerance. Cardiovascular Present- Chest Pain and Difficulty Breathing On Exertion. Not Present- Claudications, Fainting, Orthopnea and Swelling of Extremities. Gastrointestinal Present- Bloody Stool (occasional bleeding due to hemorrhoids ongoing fro years). Not Present- Abdominal Pain, Constipation, Diarrhea, Nausea and Vomiting. Musculoskeletal Not Present- Joint Pain (bilateral knee replacement 03/29/2015) and Joint Swelling. Neurological Not Present- Headaches. Hematology Not Present- Blood Clots, Easy Bruising and Nose Bleed.  Vitals (April Garrison; 02/03/2017 2:24 PM) 02/03/2017 2:05 PM Weight: 193.25 lb Height: 70.5in Body Surface Area: 2.07 m Body Mass Index: 27.34 kg/m  Pulse: 81 (Regular)  P.OX: 97% (Room air) BP: 122/68 (Sitting, Left Arm, Standard) Physical Exam Cody Page, MD; 02/03/2017 7:52 PM) General Mental Status-Alert. General Appearance-Cooperative, Appears younger than stated age, Not in acute distress. Orientation-Oriented X3. Build & Nutrition-Well built.  Head and Neck Thyroid Gland  Characteristics - no palpable nodules, no palpable enlargement.  Chest and Lung Exam Chest and lung exam reveals -normal excursion with symmetric chest walls. Palpation Tender - No chest wall tenderness.  Cardiovascular Cardiovascular examination reveals -normal heart sounds, regular rate and rhythm with no murmurs, carotid auscultation reveals no bruits, femoral artery auscultation bilaterally reveals normal pulses, no bruits, no thrills, normal pedal pulses bilaterally and no digital clubbing, cyanosis, edema, increased warmth or tenderness. Inspection Jugular vein - Right - No Distention.  Abdomen Palpation/Percussion Normal exam - Non Tender and No hepatosplenomegaly. Auscultation Normal exam - Bowel sounds normal.  Neurologic Motor-Grossly intact without any focal deficits.  Musculoskeletal - Did not examine.    Assessment & Plan Cody Page MD; 02/03/2017 7:54 PM) Atherosclerosis of native coronary artery of native heart with angina pectoris (I25.119) Story: Exercise Myoview 01/13/2017: 1. The resting electrocardiogram demonstrated normal sinus rhythm, normal resting conduction, no resting arrhythmias and normal rest repolarization. The stress electrocardiogram was abnormal. Patient developed 2 mm inferior and lateral ST segment depression which persisted for greater than 3 minutes into recovery.Patient exercised on Bruce protocol for 8: 00 minutes and achieved 10.16 METS. Stress test terminated due to chest pain and 87% MPHR achieved (Target HR >85%). 2. There is a moderate area of severe ischemia in the basal anterolateral, mid anterolateral and lateral myocardial wall(s). Overall left ventricular systolic function was normal with regional wall motion abnormalities. The left ventricular ejection fraction was calculated or visually estimated to be 65%. This is a high risk study, consider further cardiac work-up.  Coronary angiogram 10/06/2014: Distal LAD 40-50%  stenosis, first diagonal 50-70% stenosis. Obtuse marginal proximal 30-40%, ostial circumflex 30-40%. LVEF 55%. Current Plans Started Metoprolol Succinate ER 25MG, 1 (one) Tablet daily, #30, 02/03/2017, Ref. x1. Future Plans 04/25/7828: METABOLIC PANEL, BASIC (56213) - one time 03/10/2017: CBC & PLATELETS (AUTO) (08657) - one time 03/10/2017: PT (PROTHROMBIN TIME) (84696) - one time PAF (paroxysmal atrial fibrillation) (I48.0) Story: CHA2DS2-VASc Score is 2 with yearly risk of stroke of 2.2%. HAS-Bled score is 1 and estimated major bleeding in one year is 1.02-1.5%  Echocardiogram 01/23/2017: Left ventricle cavity is normal in size. Mild asymmetric hypertrophy of the left ventricle. Normal global wall motion. Visual EF is 50-55%. Normal diastolic filling  pattern, normal LAP. Left atrial cavity is mildly dilated at 4.2 cm. Trace tricuspid regurgitation. Mild pulmonary hypertension. Pulmonary artery systolic pressure is estimated at 33 mm Hg. IVC is minimally dilated with blunted respiratory response. May suggest elevated central venous pressure. Compared to 07/70/2016, mild pulmonary hypertension new.  Holter monitor 06/03/2007: Atrial fibrillation, IVCD. 2 episodes off atrial fibrillation, total duration 22 minutes. No documentation could be found (was being followed by Dr. Golden Hurter). Impression: EKG 01/08/2017: Normal Sinus rhythm at rate of 78 bpm, left atrial enlargement, IVCD, borderline criteria for LVH. Normal QT interval. No evidence of ischemia. No significant change from EKG 06/12/2016. Labwork Story: Labs 02/03/2017: Stool guaiac negative 3 A1c 5.6%, serum glucose 160 mg, creatinine 1.03, eGFR 71 mL, potassium 4.4, CMP otherwise normal. CBC normal with hemoglobin of 11.8/hematocrit 6.4, platelets 339, normal indicis. Ferritin markedly reduced at 12.6.  12/08/2016: Cholesterol 133, triglycerides 59, HDL 51, LDL 70. TSH 1.3. Creatinine 1.02, potassium 4.6, CMP normal. RBC 4.07,  hemoglobin 11.0, hematocrit 33.1, platelets 392, CBC otherwise normal.  Labs 04/12/2016: Serum glucose 107 mg, nonfasting. BUN 35, serum creatinine 1.09, eGFR 79 mL. Potassium 4.6. Alkaline phosphatase minimally elevated at 120, probably normal. AST minimally elevated at 55, HB 13.5/HCT 39.1, platelets 307.  10/19/2015: Total cholesterol 151, triglycerides 95, HDL 48, LDL 83, creatinine 0.95, potassium 4.7, CMP normal  05/05/2014: Total cholesterol 182, triglycerides 116, HDL 54, LDL 105. Non-HDL cholesterol 129. BUN 20, serum creatinine 1.08, eGFR 68,. CMP otherwise normal. Hypercholesteremia (E78.00) Current Plans Mechanism of underlying disease process and action of medications discussed with the patient. I discussed primary/secondary prevention and also dietary counceling was done. Patient has noticed significant lifestyle limiting angina pectoris. Also nuclear stress test is high risk with severe lateral ischemia hence I recommended that he proceed with coronary angiography. Medcal therapy versus proceeding with invasive procedure was discussed at length. Patient states that his lifestyle is clearly been affected and would like to proceed with angiography.  I started him on metoprolol succinate 25 mg daily. BP is soft. Schedule for cardiac catheterization, and possible angioplasty. We discussed regarding risks, benefits, alternatives to this including stress testing, CTA and continued medical therapy for stable angina pectoris. Patient wants to proceed. Understands <1-2% risk of death, stroke, MI, urgent CABG, bleeding, infection, renal failure but not limited to these. I have reviewed patient's labs/records and updated them. He has h/o hemorrhoids and recent [place] x 3 negative.  CC: Dr. Donnie Coffin.  Signed by Cody Page, MD (02/03/2017 7:55 PM)  Chief complaints: Chest pain  I had last seen the patient on 02/03/2017 for chest pain and reduced exercise tolerance, isn't a new onset  of angina pectoris and nuclear stress test on 01/23/2017 had revealed positive EKG changes with exercise with severe anterolateral ischemia. He was recommended coronary angiography due to his symptoms.  Patient states that his symptoms are improved with continued medical therapy. But wishes to proceed with angiography as he still concern about underlying CAD. Wife is present at the bedside.  Blood pressure 137/79, pulse 66, temperature 97.8 F (36.6 C), temperature source Oral, height _0  (1.778 m), weight 83.9 kg (185 lb), SpO2 96 %. Physical Exam  Constitutional: He is oriented to person, place, and time and well-developed, well-nourished, and in no distress. No distress.  HENT:  Head: Normocephalic.  Eyes: Conjunctivae and EOM are normal.  Neck: Normal range of motion.  Cardiovascular: Normal rate and regular rhythm.   Pulmonary/Chest: Effort normal and breath sounds  normal.  Abdominal: Soft.  Musculoskeletal: Normal range of motion.  Neurological: He is alert and oriented to person, place, and time.   No change in symptoms, overall slight improvement in symptomatology with exertional activities, given his abnormal nuclear stress test he wishes to proceed with angiography. Again I have discussed with him regarding the risks, benefits and alternatives. I will make further recommendations following angiography.  Adrian Prows, MD 03/18/2017, 9:16 AM [facility]. [place] Pager: [phone] Office: [phone] If no answer: Cell:  [phone]

## 2017-03-18 ENCOUNTER — Encounter (HOSPITAL_COMMUNITY): Payer: Self-pay | Admitting: *Deleted

## 2017-03-18 ENCOUNTER — Ambulatory Visit (HOSPITAL_COMMUNITY)
Admission: RE | Admit: 2017-03-18 | Discharge: 2017-03-18 | Disposition: A | Discharge status: Home / Self Care | Payer: Medicare Other | Source: Ambulatory Visit | Attending: Cardiology | Admitting: Cardiology

## 2017-03-18 ENCOUNTER — Encounter (HOSPITAL_COMMUNITY)
Admission: RE | Disposition: A | Discharge status: Home / Self Care | Payer: Self-pay | Source: Ambulatory Visit | Attending: Cardiology

## 2017-03-18 DIAGNOSIS — N529 Male erectile dysfunction, unspecified: Secondary | ICD-10-CM | POA: Diagnosis not present

## 2017-03-18 DIAGNOSIS — Z7901 Long term (current) use of anticoagulants: Secondary | ICD-10-CM | POA: Insufficient documentation

## 2017-03-18 DIAGNOSIS — I10 Essential (primary) hypertension: Secondary | ICD-10-CM | POA: Diagnosis not present

## 2017-03-18 DIAGNOSIS — I25118 Atherosclerotic heart disease of native coronary artery with other forms of angina pectoris: Secondary | ICD-10-CM | POA: Diagnosis not present

## 2017-03-18 DIAGNOSIS — Z955 Presence of coronary angioplasty implant and graft: Secondary | ICD-10-CM

## 2017-03-18 DIAGNOSIS — I480 Paroxysmal atrial fibrillation: Secondary | ICD-10-CM | POA: Insufficient documentation

## 2017-03-18 DIAGNOSIS — Z96653 Presence of artificial knee joint, bilateral: Secondary | ICD-10-CM | POA: Diagnosis not present

## 2017-03-18 DIAGNOSIS — I209 Angina pectoris, unspecified: Secondary | ICD-10-CM

## 2017-03-18 DIAGNOSIS — N400 Enlarged prostate without lower urinary tract symptoms: Secondary | ICD-10-CM | POA: Diagnosis not present

## 2017-03-18 DIAGNOSIS — M1710 Unilateral primary osteoarthritis, unspecified knee: Secondary | ICD-10-CM | POA: Diagnosis not present

## 2017-03-18 DIAGNOSIS — I25119 Atherosclerotic heart disease of native coronary artery with unspecified angina pectoris: Secondary | ICD-10-CM | POA: Diagnosis not present

## 2017-03-18 DIAGNOSIS — Z79899 Other long term (current) drug therapy: Secondary | ICD-10-CM | POA: Diagnosis not present

## 2017-03-18 DIAGNOSIS — Z85820 Personal history of malignant melanoma of skin: Secondary | ICD-10-CM | POA: Diagnosis not present

## 2017-03-18 DIAGNOSIS — E7800 Pure hypercholesterolemia, unspecified: Secondary | ICD-10-CM | POA: Diagnosis not present

## 2017-03-18 DIAGNOSIS — R9439 Abnormal result of other cardiovascular function study: Secondary | ICD-10-CM | POA: Diagnosis present

## 2017-03-18 DIAGNOSIS — Z791 Long term (current) use of non-steroidal anti-inflammatories (NSAID): Secondary | ICD-10-CM | POA: Diagnosis not present

## 2017-03-18 HISTORY — PX: LEFT HEART CATH AND CORONARY ANGIOGRAPHY: CATH118249

## 2017-03-18 HISTORY — PX: CORONARY STENT INTERVENTION: CATH118234

## 2017-03-18 LAB — BASIC METABOLIC PANEL
Anion gap: 9 (ref 5–15)
BUN: 34 mg/dL — ABNORMAL HIGH (ref 6–20)
CO2: 25 mmol/L (ref 22–32)
Calcium: 9.2 mg/dL (ref 8.9–10.3)
Chloride: 106 mmol/L (ref 101–111)
Creatinine, Ser: 1.18 mg/dL (ref 0.61–1.24)
GFR calc non Af Amer: >60 mL/min (ref >60)
Glucose, Bld: 102 mg/dL — ABNORMAL HIGH (ref 65–99)
Potassium: 4.3 mmol/L (ref 3.5–5.1)
Sodium: 140 mmol/L (ref 135–145)

## 2017-03-18 LAB — POCT ACTIVATED CLOTTING TIME
Activated Clotting Time: 197 seconds
Activated Clotting Time: 318 seconds
Activated Clotting Time: 389 seconds

## 2017-03-18 SURGERY — LEFT HEART CATH AND CORONARY ANGIOGRAPHY
Anesthesia: LOCAL

## 2017-03-18 MED ORDER — CLOPIDOGREL BISULFATE 300 MG PO TABS
ORAL_TABLET | ORAL | Status: DC | PRN
  Administered 2017-03-18: 600 mg via ORAL

## 2017-03-18 MED ORDER — SODIUM CHLORIDE 0.9 % IV SOLN: 250.0000 mL | INTRAVENOUS | Status: DC | PRN

## 2017-03-18 MED ORDER — IOPAMIDOL (ISOVUE-370) INJECTION 76%
INTRAVENOUS | Status: AC
  Filled 2017-03-18: qty 100

## 2017-03-18 MED ORDER — CLOPIDOGREL BISULFATE 75 MG PO TABS: 75.0000 mg | ORAL_TABLET | Freq: Every day | ORAL | 0 refills | Status: DC

## 2017-03-18 MED ORDER — LIDOCAINE HCL (PF) 1 % IJ SOLN
INTRAMUSCULAR | Status: DC | PRN
  Administered 2017-03-18: 3 mL via INTRADERMAL

## 2017-03-18 MED ORDER — VERAPAMIL HCL 2.5 MG/ML IV SOLN
INTRAVENOUS | Status: AC
  Filled 2017-03-18: qty 2

## 2017-03-18 MED ORDER — ASPIRIN 81 MG PO CHEW
CHEWABLE_TABLET | ORAL | Status: AC
  Administered 2017-03-18: 81 mg via ORAL
  Filled 2017-03-18: qty 1

## 2017-03-18 MED ORDER — SODIUM CHLORIDE 0.9% FLUSH: 3.0000 mL | INTRAVENOUS | Status: DC | PRN

## 2017-03-18 MED ORDER — HEPARIN SODIUM (PORCINE) 1000 UNIT/ML IJ SOLN
INTRAMUSCULAR | Status: DC | PRN
  Administered 2017-03-18 (×3): 4000 [IU] via INTRAVENOUS

## 2017-03-18 MED ORDER — HEPARIN (PORCINE) IN NACL 2-0.9 UNIT/ML-% IJ SOLN
INTRAMUSCULAR | Status: AC
  Filled 2017-03-18: qty 1000

## 2017-03-18 MED ORDER — ANGIOPLASTY BOOK: 1.0000 | Freq: Once | Status: AC

## 2017-03-18 MED ORDER — SODIUM CHLORIDE 0.9 % WEIGHT BASED INFUSION
3.0000 mL/kg/h | INTRAVENOUS | Status: DC
  Administered 2017-03-18: 3 mL/kg/h via INTRAVENOUS

## 2017-03-18 MED ORDER — NITROGLYCERIN 1 MG/10 ML FOR IR/CATH LAB
INTRA_ARTERIAL | Status: DC | PRN
  Administered 2017-03-18: 200 ug via INTRACORONARY

## 2017-03-18 MED ORDER — SODIUM CHLORIDE 0.9 % WEIGHT BASED INFUSION: 1.0000 mL/kg/h | INTRAVENOUS | Status: DC

## 2017-03-18 MED ORDER — NITROGLYCERIN 1 MG/10 ML FOR IR/CATH LAB
INTRA_ARTERIAL | Status: AC
  Filled 2017-03-18: qty 10

## 2017-03-18 MED ORDER — LABETALOL HCL 5 MG/ML IV SOLN: 10.0000 mg | INTRAVENOUS | Status: AC | PRN

## 2017-03-18 MED ORDER — HEPARIN (PORCINE) IN NACL 2-0.9 UNIT/ML-% IJ SOLN
INTRAMUSCULAR | Status: AC
  Filled 2017-03-18: qty 500

## 2017-03-18 MED ORDER — ANGIOPLASTY BOOK
Freq: Once | Status: DC
  Filled 2017-03-18: qty 1

## 2017-03-18 MED ORDER — HYDRALAZINE HCL 20 MG/ML IJ SOLN: 5.0000 mg | INTRAMUSCULAR | Status: AC | PRN

## 2017-03-18 MED ORDER — METOPROLOL SUCCINATE ER 25 MG PO TB24: 25.0000 mg | ORAL_TABLET | Freq: Every day | ORAL | 1 refills | Status: DC

## 2017-03-18 MED ORDER — HEPARIN SODIUM (PORCINE) 1000 UNIT/ML IJ SOLN
INTRAMUSCULAR | Status: AC
  Filled 2017-03-18: qty 1

## 2017-03-18 MED ORDER — SODIUM CHLORIDE 0.9 % WEIGHT BASED INFUSION: 1.0000 mL/kg/h | INTRAVENOUS | Status: AC

## 2017-03-18 MED ORDER — SODIUM CHLORIDE 0.9% FLUSH: 3.0000 mL | Freq: Two times a day (BID) | INTRAVENOUS | Status: DC

## 2017-03-18 MED ORDER — HEPARIN (PORCINE) IN NACL 2-0.9 UNIT/ML-% IJ SOLN
INTRAMUSCULAR | Status: AC | PRN
  Administered 2017-03-18: 1500 mL via INTRA_ARTERIAL

## 2017-03-18 MED ORDER — VERAPAMIL HCL 2.5 MG/ML IV SOLN
INTRA_ARTERIAL | Status: DC | PRN
  Administered 2017-03-18: 5 mL via INTRA_ARTERIAL

## 2017-03-18 MED ORDER — LIDOCAINE HCL (PF) 1 % IJ SOLN
INTRAMUSCULAR | Status: AC
  Filled 2017-03-18: qty 30

## 2017-03-18 MED ORDER — IOPAMIDOL (ISOVUE-370) INJECTION 76%
INTRAVENOUS | Status: DC | PRN
  Administered 2017-03-18: 155 mL via INTRA_ARTERIAL

## 2017-03-18 MED ORDER — ASPIRIN 81 MG PO CHEW
81.0000 mg | CHEWABLE_TABLET | ORAL | Status: AC
  Administered 2017-03-18: 81 mg via ORAL

## 2017-03-18 MED ORDER — CLOPIDOGREL BISULFATE 300 MG PO TABS
ORAL_TABLET | ORAL | Status: AC
  Filled 2017-03-18: qty 2

## 2017-03-18 SURGICAL SUPPLY — 16 items
BALLN SAPPHIRE ~~LOC~~ 3.25X18 (BALLOONS) ×2 IMPLANT
CATH HEARTRAIL 6F IL3.5 (CATHETERS) ×2 IMPLANT
CATH LAUNCHER 6FR JR4 (CATHETERS) ×2 IMPLANT
CATH OPTITORQUE TIG 4.0 5F (CATHETERS) ×2 IMPLANT
DEVICE RAD COMP TR BAND LRG (VASCULAR PRODUCTS) ×2 IMPLANT
GLIDESHEATH SLEND A-KIT 6F 20G (SHEATH) ×2 IMPLANT
GUIDEWIRE INQWIRE 1.5J.035X260 (WIRE) ×1 IMPLANT
INQWIRE 1.5J .035X260CM (WIRE) ×2
KIT ENCORE 26 ADVANTAGE (KITS) ×2 IMPLANT
KIT HEART LEFT (KITS) ×2 IMPLANT
PACK CARDIAC CATHETERIZATION (CUSTOM PROCEDURE TRAY) ×2 IMPLANT
STENT SYNERGY DES 3X28 (Permanent Stent) ×2 IMPLANT
TRANSDUCER W/STOPCOCK (MISCELLANEOUS) ×2 IMPLANT
TUBING CIL FLEX 10 FLL-RA (TUBING) ×2 IMPLANT
VALVE GUARDIAN II ~~LOC~~ HEMO (MISCELLANEOUS) ×2 IMPLANT
WIRE HI TORQ BMW 190CM (WIRE) ×2 IMPLANT

## 2017-03-18 NOTE — Interval H&P Note (Signed)
History and Physical Interval Note:  03/18/2017 9:23 AM  Cody Hodge  has presented today for surgery, with the diagnosis of abnormal stress test  The various methods of treatment have been discussed with the patient and family. After consideration of risks, benefits and other options for treatment, the patient has consented to  Procedure(s): Left Heart Cath and Coronary Angiography (N/A), possible angioplasty, as a percutaneous intervention .  The patient's history has been reviewed, patient examined, no change in status, stable for surgery.  I have reviewed the patient's chart and labs.  Questions were answered to the patient's satisfaction.    Ischemic Symptoms? CCS II (Slight limitation of ordinary activity) Anti-ischemic Medical Therapy? Minimal Therapy (1 class of medications) Non-invasive Test Results? High-risk stress test findings: cardiac mortality >3%/yr Prior CABG? No Previous CABG   Patient Information:   1-2V CAD, no prox LAD  A (7)  Indication: 18; Score: 7   Patient Information:   CTO of 1 vessel, no other CAD  U (5)  Indication: 28; Score: 5   Patient Information:   1V CAD with prox LAD  A (8)  Indication: 34; Score: 8   Patient Information:   2V-CAD with prox LAD  A (8)  Indication: 40; Score: 8   Patient Information:   3V-CAD without LMCA  A (8)  Indication: 46; Score: 8   Patient Information:   3V-CAD without LMCA With Abnormal LV systolic function  A (9)  Indication: 48; Score: 9   Patient Information:   LMCA-CAD  A (9)  Indication: 49; Score: 9   Patient Information:   2V-CAD with prox LAD PCI  A (7)  Indication: 62; Score: 7   Patient Information:   2V-CAD with prox LAD CABG  A (8)  Indication: 62; Score: 8   Patient Information:   3V-CAD without LMCA With Low CAD burden(i.e., 3 focal stenoses, low SYNTAX score) PCI  A (7)  Indication: 63; Score: 7   Patient Information:   3V-CAD without LMCA With  Low CAD burden(i.e., 3 focal stenoses, low SYNTAX score) CABG  A (9)  Indication: 63; Score: 9   Patient Information:   3V-CAD without LMCA E06c - Intermediate-high CAD burden (i.e., multiple diffuse lesions, presence of CTO, or high SYNTAX score) PCI  U (4)  Indication: 64; Score: 4   Patient Information:   3V-CAD without LMCA E06c - Intermediate-high CAD burden (i.e., multiple diffuse lesions, presence of CTO, or high SYNTAX score) CABG  A (9)  Indication: 64; Score: 9   Patient Information:   LMCA-CAD With Isolated LMCA stenosis  PCI  U (6)  Indication: 65; Score: 6   Patient Information:   LMCA-CAD With Isolated LMCA stenosis  CABG  A (9)  Indication: 65; Score: 9   Patient Information:   LMCA-CAD Additional CAD, low CAD burden (i.e., 1- to 2-vessel additional involvement, low SYNTAX score) PCI  U (5)  Indication: 66; Score: 5   Patient Information:   LMCA-CAD Additional CAD, low CAD burden (i.e., 1- to 2-vessel additional involvement, low SYNTAX score) CABG  A (9)  Indication: 66; Score: 9   Patient Information:   LMCA-CAD Additional CAD, intermediate-high CAD burden (i.e., 3-vessel involvement, presence of CTO, or high SYNTAX score) PCI  I (3)  Indication: 67; Score: 3   Patient Information:   LMCA-CAD Additional CAD, intermediate-high CAD burden (i.e., 3-vessel involvement, presence of CTO, or high SYNTAX score) CABG  A (9)  Indication: 67; Score: 9   Lila Lufkin,  Annaleigha Woo

## 2017-03-18 NOTE — Progress Notes (Signed)
Ed completed, reinforcing Plavix/Eliquis, restrictions, diet, ex, and CRPII. Pt sts he has not been prescribed NTG so did not discuss. Will send referral for CRPII to [place], pt to decide later if he is interested.  [place], ACSM 3:15 PM 03/18/2017

## 2017-03-18 NOTE — Discharge Instructions (Signed)

## 2017-03-18 NOTE — CV Procedure (Deleted)
Right radial access was obtained. Left heart catheterization and coronary angiogram was performed using a 5 French straight 4.0 catheter. Continues hemodynamic assessment was performed. Coronary anatomy described as below. Briefly, patient has 20% distal left main stenosis. 2 diagonal branches have 80% stenosis without any discrete lesion in the LAD. Nondominant circumflex has a 90% and ostial stenosis followed by subtotal occlusion. Dominant right coronary artery has a 90% stenosis in the right posterior lateral branch that gave collaterals to subtotally occluded nondominant circumflex. It also supplies the apex to septal perforator branches.  We performed coronary intervention on the second posterior branch of right coronary artery using direct stenting strategy with a 3.0 X 28 Synergy drug-eluting stent over BMW wire. Post dilatation was performed using a 3.25 x 18 Sapphire noncompliant balloon.  Excellent result was obtained with 0% residual stenosis and TIMI-3 flow. Wire and balloons were removed and a final angiography was performed that showed no complications.

## 2017-03-18 NOTE — Progress Notes (Signed)
Patient stable for discharge. No complications from procedure. All questions answered. Christen Butter

## 2017-03-19 ENCOUNTER — Encounter (HOSPITAL_COMMUNITY): Payer: Self-pay | Admitting: Cardiology

## 2017-03-26 ENCOUNTER — Telehealth (HOSPITAL_COMMUNITY): Payer: Self-pay

## 2017-03-26 DIAGNOSIS — R9439 Abnormal result of other cardiovascular function study: Secondary | ICD-10-CM | POA: Diagnosis not present

## 2017-03-26 DIAGNOSIS — I25118 Atherosclerotic heart disease of native coronary artery with other forms of angina pectoris: Secondary | ICD-10-CM | POA: Diagnosis not present

## 2017-03-26 NOTE — Telephone Encounter (Signed)
Patient insurances are active and benefits verified. Patient insurances are Medicare A/B and [place]. Medicare A/B - no co-payment, deductible $183.00/$183.00 has been met, no out of pocket, 20% co-insurance and no pre-authorization. Passport/reference [phone]. [employer] - no co-payment, no deductible, no out of pocket, no co-insurance and no pre-authorization. Follows medicare guidelines. Passport/reference [phone].

## 2017-03-28 DIAGNOSIS — I480 Paroxysmal atrial fibrillation: Secondary | ICD-10-CM | POA: Diagnosis not present

## 2017-03-28 DIAGNOSIS — E7800 Pure hypercholesterolemia, unspecified: Secondary | ICD-10-CM | POA: Diagnosis not present

## 2017-03-28 DIAGNOSIS — I25119 Atherosclerotic heart disease of native coronary artery with unspecified angina pectoris: Secondary | ICD-10-CM | POA: Diagnosis not present

## 2017-04-02 ENCOUNTER — Telehealth (HOSPITAL_COMMUNITY): Payer: Self-pay

## 2017-04-02 NOTE — Telephone Encounter (Signed)
I called patient to discuss scheduling for cardiac rehab. Patient was not available by phone. I left message on patient voicemail to call office. Office contact information was left on patient voicemail to return call.

## 2017-04-22 ENCOUNTER — Telehealth (HOSPITAL_COMMUNITY): Payer: Self-pay

## 2017-04-22 NOTE — Telephone Encounter (Signed)
I called and left message on voicemail to call office about scheduling for cardiac rehab. I left office contact information on patient voicemail to return call.  ° °

## 2017-04-23 ENCOUNTER — Emergency Department (HOSPITAL_COMMUNITY)
Admission: EM | Admit: 2017-04-23 | Discharge: 2017-04-23 | Disposition: A | Discharge status: Home / Self Care | Payer: Medicare Other | Attending: Emergency Medicine | Admitting: Emergency Medicine

## 2017-04-23 ENCOUNTER — Encounter (HOSPITAL_COMMUNITY): Payer: Self-pay

## 2017-04-23 ENCOUNTER — Emergency Department (HOSPITAL_COMMUNITY): Payer: Medicare Other

## 2017-04-23 ENCOUNTER — Encounter (HOSPITAL_COMMUNITY): Payer: Self-pay | Admitting: Emergency Medicine

## 2017-04-23 DIAGNOSIS — Z96653 Presence of artificial knee joint, bilateral: Secondary | ICD-10-CM | POA: Diagnosis not present

## 2017-04-23 DIAGNOSIS — S4991XA Unspecified injury of right shoulder and upper arm, initial encounter: Secondary | ICD-10-CM | POA: Diagnosis not present

## 2017-04-23 DIAGNOSIS — M25511 Pain in right shoulder: Secondary | ICD-10-CM | POA: Diagnosis not present

## 2017-04-23 DIAGNOSIS — Y9373 Activity, racquet and hand sports: Secondary | ICD-10-CM | POA: Diagnosis not present

## 2017-04-23 DIAGNOSIS — I10 Essential (primary) hypertension: Secondary | ICD-10-CM | POA: Insufficient documentation

## 2017-04-23 DIAGNOSIS — Z79899 Other long term (current) drug therapy: Secondary | ICD-10-CM | POA: Insufficient documentation

## 2017-04-23 DIAGNOSIS — Y92312 Tennis court as the place of occurrence of the external cause: Secondary | ICD-10-CM | POA: Insufficient documentation

## 2017-04-23 DIAGNOSIS — S43101A Unspecified dislocation of right acromioclavicular joint, initial encounter: Secondary | ICD-10-CM | POA: Diagnosis not present

## 2017-04-23 DIAGNOSIS — W01198A Fall on same level from slipping, tripping and stumbling with subsequent striking against other object, initial encounter: Secondary | ICD-10-CM | POA: Diagnosis not present

## 2017-04-23 DIAGNOSIS — Z7902 Long term (current) use of antithrombotics/antiplatelets: Secondary | ICD-10-CM | POA: Insufficient documentation

## 2017-04-23 DIAGNOSIS — Y998 Other external cause status: Secondary | ICD-10-CM | POA: Insufficient documentation

## 2017-04-23 MED ORDER — BACITRACIN ZINC 500 UNIT/GM EX OINT
TOPICAL_OINTMENT | Freq: Once | CUTANEOUS | Status: AC
  Administered 2017-04-23: 23:00:00 via TOPICAL
  Filled 2017-04-23: qty 0.9

## 2017-04-23 MED ORDER — HYDROCODONE-ACETAMINOPHEN 5-325 MG PO TABS: 1.0000 | ORAL_TABLET | Freq: Four times a day (QID) | ORAL | 0 refills | Status: DC | PRN

## 2017-04-23 MED ORDER — HYDROCODONE-ACETAMINOPHEN 5-325 MG PO TABS
1.0000 | ORAL_TABLET | Freq: Once | ORAL | Status: AC
  Administered 2017-04-23: 1 via ORAL
  Filled 2017-04-23: qty 1

## 2017-04-23 NOTE — ED Provider Notes (Signed)
[facility] DEPT Provider Note   CSN: [id number] Arrival date & time: 04/23/17  2023     History   Chief Complaint Chief Complaint  Patient presents with  . Shoulder Injury    HPI Cody Hodge is a 71 y.o. male.  HPI    Cody Hodge is a 71 y.o. male, with a history of HTN, CAD, cardiac stent, and PAF, presenting to the ED with a right shoulder injury that occurred shortly prior to arrival. Patient states he was playing tennis, tripped, and fell. Patient fell with his arm at his side. His pain to the right shoulder is rated 7/10, throbbing, nonradiating. He also notes an abrasion to the right lateral elbow. Denies head injury, LOC, nausea/vomiting, neck/back pain, weakness, numbness, chest pain, shortness of breath, dizziness, or any other complaints.  Patient notes he had a cardiac cath with stent placement on 03/18/2017. Placed on Plavix and Eliquis.  Patient prefers [facility] orthopedics as he had bilateral total knee arthroplasties performed by Dr. Wynelle Link in 2016.  Past Medical History:  Diagnosis Date  . Arthritis   . BPH (benign prostatic hyperplasia)   . Coronary artery disease 09/2014   50% mid to distal LAD, 50-70% ostial first diag, 40% ostial and mid left circ.  . ED (erectile dysfunction)   . Hemorrhoids    associated with bowel movements  . HTN (hypertension)   . Hypercholesteremia   . Knee arthropathy   . Melanoma ([place])   . Melanoma ([place])   . Mixed hyperlipidemia   . OSA (obstructive sleep apnea)    mild to moderate refused CPAP  . PAF (paroxysmal atrial fibrillation) (HCC)    CHADS VASC score 2 (age>65 and HTN)  . Tremor     Patient Active Problem List   Diagnosis Date Noted  . Angina pectoris ([place]) 03/16/2017  . Acute blood loss anemia 04/11/2015  . Dehydration 04/11/2015  . Constipation 04/11/2015  . Status post total bilateral knee replacement   . Postoperative anemia due to acute blood loss 04/03/2015  . Leucocytosis 04/03/2015    . OA (osteoarthritis) of knee 03/29/2015  . Abnormal myocardial perfusion study 10/06/2014  . Dyspnea on exertion 09/26/2014  . Family history of early CAD 09/26/2014  . HTN (hypertension)   . Mixed hyperlipidemia   . OSA (obstructive sleep apnea)   . PAF (paroxysmal atrial fibrillation) ([place])     Past Surgical History:  Procedure Laterality Date  . CARDIAC CATHETERIZATION     2'16- only ever heart cath  . CORONARY STENT INTERVENTION N/A 03/18/2017   Procedure: CORONARY STENT INTERVENTION;  Surgeon: Nigel Mormon, MD;  Location: [facility];  Service: Cardiovascular;  Laterality: N/A;  . HYDROCELE EXCISION / REPAIR    . LASIK    . left and right arthroscopy    . left forearm melanoma    . LEFT HEART CATH AND CORONARY ANGIOGRAPHY N/A 03/18/2017   Procedure: Left Heart Cath and Coronary Angiography;  Surgeon: Nigel Mormon, MD;  Location: [facility];  Service: Cardiovascular;  Laterality: N/A;  . LEFT HEART CATHETERIZATION WITH CORONARY ANGIOGRAM N/A 10/06/2014   Procedure: LEFT HEART CATHETERIZATION WITH CORONARY ANGIOGRAM;  Surgeon: Sinclair Grooms, MD;  Location: [facility] CATH LAB;  Service: Cardiovascular;  Laterality: N/A;  . TOTAL KNEE ARTHROPLASTY Bilateral 03/29/2015   Procedure: TOTAL KNEE BILATERAL;  Surgeon: Gaynelle Arabian, MD;  Location: [facility] ORS;  Service: Orthopedics;  Laterality: Bilateral;  spinal anes. placed in OR  Home Medications    Prior to Admission medications   Medication Sig Start Date End Date Taking? Authorizing Provider  apixaban (ELIQUIS) 5 MG TABS tablet Take 5 mg by mouth 2 (two) times daily.   Yes [provider]  atorvastatin (LIPITOR) 20 MG tablet Take 20 mg by mouth daily at 6 PM.    Yes [provider]  cholecalciferol (VITAMIN D) 1000 units tablet Take 1,000 Units by mouth daily.   Yes [provider]  clopidogrel (PLAVIX) 75 MG tablet Take 1 tablet (75 mg total) by mouth daily. 03/18/17  Yes  Adrian Prows, MD  Coenzyme Q10 (CO Q-10) 200 MG CAPS Take 200 mg by mouth daily.   Yes [provider]  ferrous sulfate 325 (65 FE) MG tablet Take 325 mg by mouth daily.   Yes [provider]  finasteride (PROSCAR) 5 MG tablet Take 5 mg by mouth daily.  05/18/13  Yes [provider]  flecainide (TAMBOCOR) 100 MG tablet Take 50 mg by mouth 2 (two) times daily.  03/14/15  Yes [provider]  ibuprofen (ADVIL,MOTRIN) 200 MG tablet Take 400 mg by mouth 2 (two) times daily as needed for moderate pain.   Yes [provider]  metoprolol succinate (TOPROL-XL) 25 MG 24 hr tablet Take 1 tablet (25 mg total) by mouth daily. 03/18/17  Yes Adrian Prows, MD  Multiple Vitamin (MULTIVITAMIN WITH MINERALS) TABS tablet Take 1 tablet by mouth daily.   Yes [provider]  Polyethyl Glycol-Propyl Glycol (SYSTANE OP) Place 1 drop into both eyes 2 (two) times daily.    Yes [provider]  Resveratrol 250 MG CAPS Take 250 mg by mouth 2 (two) times daily.   Yes [provider]  TURMERIC PO Take 1 capsule by mouth 2 (two) times daily.    Yes [provider]  HYDROcodone-acetaminophen (NORCO/VICODIN) 5-325 MG tablet Take 1-2 tablets by mouth every 6 (six) hours as needed for severe pain. 04/23/17   Joy, Helane Gunther, PA-C    Family History Family History  Problem Relation Age of Onset  . Dementia Mother   . Heart attack Father   . Heart disease Father     Social History Social History  Substance Use Topics  . Smoking status: Never Smoker  . Smokeless tobacco: Never Used  . Alcohol use Yes     Comment: 4 drinks per week     Allergies   Patient has no known allergies.   Review of Systems Review of Systems  Respiratory: Negative for shortness of breath.   Cardiovascular: Negative for chest pain.  Gastrointestinal: Negative for abdominal pain, nausea and vomiting.  Musculoskeletal: Positive for arthralgias. Negative for back pain and neck  pain.  Skin: Positive for wound.  Neurological: Negative for dizziness, syncope, weakness, light-headedness, numbness and headaches.     Physical Exam Updated Vital Signs BP (!) 141/101 (BP Location: Left Arm)   Pulse 63   Temp 98.1 F (36.7 C) (Oral)   Resp 18   SpO2 97%   Physical Exam  Constitutional: He appears well-developed and well-nourished. No distress.  HENT:  Head: Normocephalic and atraumatic.  Eyes: Conjunctivae are normal.  Neck: Neck supple.  Cardiovascular: Normal rate, regular rhythm and intact distal pulses.   Pulses:      Radial pulses are 2+ on the right side.  Pulmonary/Chest: Effort normal.  Musculoskeletal: He exhibits edema, tenderness and deformity.       Right shoulder: He exhibits tenderness, deformity and  pain. He exhibits normal pulse.       Right elbow: He exhibits no swelling and no deformity. No tenderness found.       Right wrist: Normal. He exhibits normal range of motion, no tenderness, no crepitus and no deformity.  Swelling and deformity noted to the right superior shoulder. Motor function intact in the right elbow.  Full range of motion in the right wrist. Patient hesitates to move the right elbow as it causes pain in the right shoulder, but states he was able to move the right elbow fully right after the injury occurred.   Neurological: He is alert.  No noted sensory deficits in the bilateral upper extremities. Grip strengths equal bilaterally.  Skin: Skin is warm and dry. Capillary refill takes less than 2 seconds. Abrasion noted. He is not diaphoretic. No pallor.     Small, superficial abrasion to the right lateral elbow.  Psychiatric: He has a normal mood and affect. His behavior is normal.  Nursing note and vitals reviewed.    ED Treatments / Results  Labs (all labs ordered are listed, but only abnormal results are displayed) Labs Reviewed - No data to display  EKG  EKG Interpretation None       Radiology Dg Shoulder  Right  Result Date: 04/23/2017 CLINICAL DATA:  Patient fell playing tennis this morning now complains of right shoulder pain with lack of shoulder mobility. EXAM: RIGHT SHOULDER - 2+ VIEW COMPARISON:  None. FINDINGS: There is superior displacement nearly 2 shaft widths of the right clavicle from the [facility] joint with widening of the coracoclavicular distance consistent with a type 3 AC joint separation. Osteoarthritis of the glenohumeral joint with inferomedial spurring off the humeral head and joint space narrowing is identified. The adjacent ribs and lung are nonacute. Images of the scapula appear unremarkable. IMPRESSION: 1. Type 3 AC joint separation. 2. Osteoarthritis of the glenohumeral joint. 3. No acute fracture identified. Electronically Signed   By: Ashley Royalty M.D.   On: 04/23/2017 21:56   Dg Humerus Right  Result Date: 04/23/2017 CLINICAL DATA:  Patient fell on right shoulder while playing tennis. Deformity. EXAM: RIGHT HUMERUS - 2+ VIEW COMPARISON:  Same day shoulder radiographs. FINDINGS: There is no evidence of acute fracture of the right humerus. Osteoarthritis of the right glenohumeral joint. Intact elbow joint. AC joint separation is again noted. Soft tissues are unremarkable. IMPRESSION: 1. AC joint separation again demonstrated. 2. Osteoarthritis of the right glenohumeral joint. 3. No acute fracture of the humerus. Electronically Signed   By: Ashley Royalty M.D.   On: 04/23/2017 22:44    Procedures ORTHOPEDIC INJURY TREATMENT Date/Time: 04/23/2017 11:00 PM Performed by: Lorayne Bender Authorized by: Arlean Hopping C  Consent: Verbal consent obtained. Risks and benefits: risks, benefits and alternatives were discussed Consent given by: patient Patient understanding: patient states understanding of the procedure being performed Patient consent: the patient's understanding of the procedure matches consent given Procedure consent: procedure consent matches procedure scheduled Patient identity  confirmed: verbally with patient Injury location: shoulder Location details: right shoulder Injury type: dislocation Dislocation type: type III AC separation Chronicity: new Pre-procedure neurovascular assessment: neurovascularly intact Pre-procedure distal perfusion: normal Pre-procedure neurological function: normal Pre-procedure range of motion: reduced  Anesthesia: Local anesthesia used: no  Sedation: Patient sedated: no Manipulation performed: no Immobilization: sling Post-procedure neurovascular assessment: post-procedure neurovascularly intact Post-procedure distal perfusion: normal Post-procedure neurological function: normal Post-procedure range of motion: unchanged Patient tolerance: Patient tolerated the procedure well with no immediate  complications Comments: Sling initially placed by ortho tech. Sling position and height was adjusted until a position of relative comfort was reached. Patient's neurovascular status was rechecked after each adjustment and found to be intact. A final neurovascular check was performed just prior to discharge and found to be intact.    (including critical care time)  Medications Ordered in ED Medications  bacitracin ointment ( Topical Given 04/23/17 2307)  HYDROcodone-acetaminophen (NORCO/VICODIN) 5-325 MG per tablet 1 tablet (1 tablet Oral Given 04/23/17 2306)     Initial Impression / Assessment and Plan / ED Course  I have reviewed the triage vital signs and the nursing notes.  Pertinent labs & imaging results that were available during my care of the patient were reviewed by me and considered in my medical decision making (see chart for details).  Clinical Course as of Apr 23 2330  Wed Apr 23, 2017  2115 Patient was offered pain management upon first patient contact, but declined at this time.  [SJ]  2218 Discussed imaging results with the patient. Offered pain management. Patient declined.   [SJ]  2221 Spoke with Dr. Doran Durand,  Orthopedic surgeon on call for [facility]. Recommends a shoulder sling immobilization and office follow up this week.   [SJ]    Clinical Course User Index [SJ] Joy, Shawn C, PA-C     Patient presents for evaluation following a right shoulder injury. Type III AC separation noted on x-ray.Neurovascularly intact. Orthopedic surgeon consulted. Office follow-up. Patient voiced decrease in pain with final position of sling. The patient was given instructions for home care as well as return precautions. Patient voices understanding of these instructions, accepts the plan, and is comfortable with discharge.  Findings and plan of care discussed with Isla Pence, MD. Dr. Gilford Raid personally evaluated and examined this patient.   Final Clinical Impressions(s) / ED Diagnoses   Final diagnoses:  Separation of right acromioclavicular joint, type 3, initial encounter    New Prescriptions Discharge Medication List as of 04/23/2017 11:09 PM    START taking these medications   Details  HYDROcodone-acetaminophen (NORCO/VICODIN) 5-325 MG tablet Take 1-2 tablets by mouth every 6 (six) hours as needed for severe pain., Starting Wed 04/23/2017, Print         Joy, [place], PA-C 04/23/17 2333    Lorayne Bender, PA-C 04/23/17 2334    Isla Pence, MD 04/24/17 0005

## 2017-04-23 NOTE — Progress Notes (Signed)
I have mailed patient a letter with information about cardiac rehab. °

## 2017-04-23 NOTE — ED Triage Notes (Signed)
Pt states he was playing tennis and fell on his right shoulder  Deformity noted

## 2017-04-23 NOTE — Discharge Instructions (Signed)
You have been seen today for a shoulder injury. You have evidence of what is known as an acromioclavicular (AC) separation. Tylenol: May take tylenol for pain. Your daily total maximum amount of tylenol from all sources should be limited to 4000mg /day for persons without liver problems, or 2000mg /day for those with liver problems. Vicodin: Vicodin for severe pain. Do not drive or perform other dangerous activities while taking the Vicodin. Ice: May apply ice to the area over the next 24 hours for 15 minutes at a time to reduce swelling. Elevation: Keep the extremity elevated as often as possible to reduce pain and inflammation. Support: Wear the sling for support and comfort. Follow up: Please follow-up with the orthopedic specialist. Call the number provided to set up an appointment for later this week.

## 2017-04-23 NOTE — ED Notes (Signed)
Pt transported to xray 

## 2017-04-24 DIAGNOSIS — S43102A Unspecified dislocation of left acromioclavicular joint, initial encounter: Secondary | ICD-10-CM | POA: Diagnosis not present

## 2017-05-01 DIAGNOSIS — S43102D Unspecified dislocation of left acromioclavicular joint, subsequent encounter: Secondary | ICD-10-CM | POA: Diagnosis not present

## 2017-06-05 DIAGNOSIS — Z23 Encounter for immunization: Secondary | ICD-10-CM | POA: Diagnosis not present

## 2017-08-18 DIAGNOSIS — H34232 Retinal artery branch occlusion, left eye: Secondary | ICD-10-CM | POA: Diagnosis not present

## 2017-08-29 DIAGNOSIS — G453 Amaurosis fugax: Secondary | ICD-10-CM | POA: Diagnosis not present

## 2017-09-01 DIAGNOSIS — G453 Amaurosis fugax: Secondary | ICD-10-CM | POA: Diagnosis not present

## 2017-09-01 DIAGNOSIS — I4891 Unspecified atrial fibrillation: Secondary | ICD-10-CM | POA: Diagnosis not present

## 2017-09-01 DIAGNOSIS — Z955 Presence of coronary angioplasty implant and graft: Secondary | ICD-10-CM | POA: Diagnosis not present

## 2017-09-02 DIAGNOSIS — H34232 Retinal artery branch occlusion, left eye: Secondary | ICD-10-CM | POA: Diagnosis not present

## 2017-09-05 DIAGNOSIS — G453 Amaurosis fugax: Secondary | ICD-10-CM | POA: Diagnosis not present

## 2017-09-08 DIAGNOSIS — Z955 Presence of coronary angioplasty implant and graft: Secondary | ICD-10-CM | POA: Diagnosis not present

## 2017-09-08 DIAGNOSIS — G453 Amaurosis fugax: Secondary | ICD-10-CM | POA: Diagnosis not present

## 2017-09-08 DIAGNOSIS — I4891 Unspecified atrial fibrillation: Secondary | ICD-10-CM | POA: Diagnosis not present

## 2017-09-17 DIAGNOSIS — H34232 Retinal artery branch occlusion, left eye: Secondary | ICD-10-CM | POA: Diagnosis not present

## 2017-10-28 DIAGNOSIS — L570 Actinic keratosis: Secondary | ICD-10-CM | POA: Diagnosis not present

## 2017-10-28 DIAGNOSIS — D225 Melanocytic nevi of trunk: Secondary | ICD-10-CM | POA: Diagnosis not present

## 2017-10-28 DIAGNOSIS — L814 Other melanin hyperpigmentation: Secondary | ICD-10-CM | POA: Diagnosis not present

## 2017-10-28 DIAGNOSIS — D485 Neoplasm of uncertain behavior of skin: Secondary | ICD-10-CM | POA: Diagnosis not present

## 2017-10-28 DIAGNOSIS — Z85820 Personal history of malignant melanoma of skin: Secondary | ICD-10-CM | POA: Diagnosis not present

## 2017-10-28 DIAGNOSIS — L821 Other seborrheic keratosis: Secondary | ICD-10-CM | POA: Diagnosis not present

## 2017-10-28 DIAGNOSIS — L249 Irritant contact dermatitis, unspecified cause: Secondary | ICD-10-CM | POA: Diagnosis not present

## 2017-10-28 DIAGNOSIS — C44519 Basal cell carcinoma of skin of other part of trunk: Secondary | ICD-10-CM | POA: Diagnosis not present

## 2017-10-30 DIAGNOSIS — I25119 Atherosclerotic heart disease of native coronary artery with unspecified angina pectoris: Secondary | ICD-10-CM | POA: Diagnosis not present

## 2017-10-30 DIAGNOSIS — E7800 Pure hypercholesterolemia, unspecified: Secondary | ICD-10-CM | POA: Diagnosis not present

## 2017-10-30 DIAGNOSIS — I480 Paroxysmal atrial fibrillation: Secondary | ICD-10-CM | POA: Diagnosis not present

## 2017-10-30 DIAGNOSIS — H34212 Partial retinal artery occlusion, left eye: Secondary | ICD-10-CM | POA: Diagnosis not present

## 2017-11-12 ENCOUNTER — Other Ambulatory Visit: Payer: Self-pay

## 2017-11-12 ENCOUNTER — Emergency Department (HOSPITAL_COMMUNITY)
Admission: EM | Admit: 2017-11-12 | Discharge: 2017-11-12 | Disposition: A | Discharge status: Home / Self Care | Payer: Medicare Other | Attending: Emergency Medicine | Admitting: Emergency Medicine

## 2017-11-12 ENCOUNTER — Encounter (HOSPITAL_COMMUNITY): Payer: Self-pay | Admitting: Family Medicine

## 2017-11-12 DIAGNOSIS — Z85828 Personal history of other malignant neoplasm of skin: Secondary | ICD-10-CM | POA: Insufficient documentation

## 2017-11-12 DIAGNOSIS — Z79899 Other long term (current) drug therapy: Secondary | ICD-10-CM | POA: Diagnosis not present

## 2017-11-12 DIAGNOSIS — Z7901 Long term (current) use of anticoagulants: Secondary | ICD-10-CM | POA: Insufficient documentation

## 2017-11-12 DIAGNOSIS — Z96653 Presence of artificial knee joint, bilateral: Secondary | ICD-10-CM | POA: Diagnosis not present

## 2017-11-12 DIAGNOSIS — L7621 Postprocedural hemorrhage and hematoma of skin and subcutaneous tissue following a dermatologic procedure: Secondary | ICD-10-CM | POA: Diagnosis not present

## 2017-11-12 DIAGNOSIS — I10 Essential (primary) hypertension: Secondary | ICD-10-CM | POA: Diagnosis not present

## 2017-11-12 DIAGNOSIS — L7622 Postprocedural hemorrhage and hematoma of skin and subcutaneous tissue following other procedure: Secondary | ICD-10-CM | POA: Insufficient documentation

## 2017-11-12 DIAGNOSIS — C44519 Basal cell carcinoma of skin of other part of trunk: Secondary | ICD-10-CM | POA: Diagnosis not present

## 2017-11-12 DIAGNOSIS — Z5189 Encounter for other specified aftercare: Secondary | ICD-10-CM

## 2017-11-12 DIAGNOSIS — I2510 Atherosclerotic heart disease of native coronary artery without angina pectoris: Secondary | ICD-10-CM | POA: Insufficient documentation

## 2017-11-12 DIAGNOSIS — Z48817 Encounter for surgical aftercare following surgery on the skin and subcutaneous tissue: Secondary | ICD-10-CM | POA: Diagnosis not present

## 2017-11-12 NOTE — Discharge Instructions (Addendum)
Allow pressure dressing to stay on for tonight.  You may remove the gauze tomorrow and follow up with your dermatologist as previously scheduled. If bleeding return, apply direct pressure using finger for approximately 5 minutes, if no improvement return if you have any concerns.

## 2017-11-12 NOTE — ED Provider Notes (Signed)
[facility] DEPT Provider Note   CSN: [id number] Arrival date & time: 11/12/17  1937     History   Chief Complaint Chief Complaint  Patient presents with  . Post-op Problem    HPI Cody Hodge is a 72 y.o. male.  HPI   72 year old male with history of basal cell carcinoma of the chest presenting for postop complication.  Patient is currently on Eliquis.  He had a basal cell cancer excision performed by his dermatologist approximately 7 hours ago.  He reports that the cancer was approximately 3 mm in size.  He was told that the procedure went well and was subsequently discharged home.  For the past 2 hours, it has been oozing out blood from the surgical site.  No increasing pain, no shortness of breath.  No other complaint.  Past Medical History:  Diagnosis Date  . Arthritis   . BPH (benign prostatic hyperplasia)   . Coronary artery disease 09/2014   50% mid to distal LAD, 50-70% ostial first diag, 40% ostial and mid left circ.  . ED (erectile dysfunction)   . Hemorrhoids    associated with bowel movements  . HTN (hypertension)   . Hypercholesteremia   . Knee arthropathy   . Melanoma ([place])   . Melanoma ([place])   . Mixed hyperlipidemia   . OSA (obstructive sleep apnea)    mild to moderate refused CPAP  . PAF (paroxysmal atrial fibrillation) (HCC)    CHADS VASC score 2 (age>65 and HTN)  . Tremor     Patient Active Problem List   Diagnosis Date Noted  . Angina pectoris ([place]) 03/16/2017  . Acute blood loss anemia 04/11/2015  . Dehydration 04/11/2015  . Constipation 04/11/2015  . Status post total bilateral knee replacement   . Postoperative anemia due to acute blood loss 04/03/2015  . Leucocytosis 04/03/2015  . OA (osteoarthritis) of knee 03/29/2015  . Abnormal myocardial perfusion study 10/06/2014  . Dyspnea on exertion 09/26/2014  . Family history of early CAD 09/26/2014  . HTN (hypertension)   . Mixed hyperlipidemia   . OSA  (obstructive sleep apnea)   . PAF (paroxysmal atrial fibrillation) ([place])     Past Surgical History:  Procedure Laterality Date  . CARDIAC CATHETERIZATION     2'16- only ever heart cath  . CATARACT EXTRACTION, BILATERAL    . CORONARY STENT INTERVENTION N/A 03/18/2017   Procedure: CORONARY STENT INTERVENTION;  Surgeon: Nigel Mormon, MD;  Location: [place] [facility];  Service: Cardiovascular;  Laterality: N/A;  . HYDROCELE EXCISION / REPAIR    . LASIK    . left and right arthroscopy    . left forearm melanoma    . LEFT HEART CATH AND CORONARY ANGIOGRAPHY N/A 03/18/2017   Procedure: Left Heart Cath and Coronary Angiography;  Surgeon: Nigel Mormon, MD;  Location: [facility];  Service: Cardiovascular;  Laterality: N/A;  . LEFT HEART CATHETERIZATION WITH CORONARY ANGIOGRAM N/A 10/06/2014   Procedure: LEFT HEART CATHETERIZATION WITH CORONARY ANGIOGRAM;  Surgeon: Sinclair Grooms, MD;  Location: [facility] CATH LAB;  Service: Cardiovascular;  Laterality: N/A;  . TOTAL KNEE ARTHROPLASTY Bilateral 03/29/2015   Procedure: TOTAL KNEE BILATERAL;  Surgeon: Gaynelle Arabian, MD;  Location: [facility];  Service: Orthopedics;  Laterality: Bilateral;  spinal anes. placed in [place] Medications    Prior to Admission medications   Medication Sig Start Date End Date Taking? Authorizing Provider  apixaban (  ELIQUIS) 5 MG TABS tablet Take 5 mg by mouth 2 (two) times daily.    [provider]  atorvastatin (LIPITOR) 20 MG tablet Take 20 mg by mouth daily at 6 PM.     [provider]  cholecalciferol (VITAMIN D) 1000 units tablet Take 1,000 Units by mouth daily.    [provider]  clopidogrel (PLAVIX) 75 MG tablet Take 1 tablet (75 mg total) by mouth daily. 03/18/17   Adrian Prows, MD  Coenzyme Q10 (CO Q-10) 200 MG CAPS Take 200 mg by mouth daily.    [provider]  ferrous sulfate 325 (65 FE) MG tablet Take 325 mg by mouth daily.    [provider]    finasteride (PROSCAR) 5 MG tablet Take 5 mg by mouth daily.  05/18/13   [provider]  flecainide (TAMBOCOR) 100 MG tablet Take 50 mg by mouth 2 (two) times daily.  03/14/15   [provider]  HYDROcodone-acetaminophen (NORCO/VICODIN) 5-325 MG tablet Take 1-2 tablets by mouth every 6 (six) hours as needed for severe pain. 04/23/17   Joy, Shawn C, PA-C  ibuprofen (ADVIL,MOTRIN) 200 MG tablet Take 400 mg by mouth 2 (two) times daily as needed for moderate pain.    [provider]  metoprolol succinate (TOPROL-XL) 25 MG 24 hr tablet Take 1 tablet (25 mg total) by mouth daily. 03/18/17   Adrian Prows, MD  Multiple Vitamin (MULTIVITAMIN WITH MINERALS) TABS tablet Take 1 tablet by mouth daily.    [provider]  Polyethyl Glycol-Propyl Glycol (SYSTANE OP) Place 1 drop into both eyes 2 (two) times daily.     [provider]  Resveratrol 250 MG CAPS Take 250 mg by mouth 2 (two) times daily.    [provider]  TURMERIC PO Take 1 capsule by mouth 2 (two) times daily.     [provider]    Family History Family History  Problem Relation Age of Onset  . Dementia Mother   . Heart attack Father   . Heart disease Father     Social History Social History   Tobacco Use  . Smoking status: Never Smoker  . Smokeless tobacco: Never Used  Substance Use Topics  . Alcohol use: Yes    Comment: 4 drinks per week  . Drug use: No     Allergies   Patient has no known allergies.   Review of Systems Review of Systems  Constitutional: Negative for fever.  Skin: Positive for wound.  Neurological: Negative for numbness.  Hematological: Bruises/bleeds easily.     Physical Exam Updated Vital Signs BP (!) 154/87 (BP Location: Right Arm)   Pulse 80   Temp 99.1 F (37.3 C) (Oral)   Resp 20   SpO2 98%   Physical Exam  Constitutional: He appears well-developed and well-nourished. No distress.  HENT:  Head: Atraumatic.  Eyes: Conjunctivae  are normal.  Neck: Normal range of motion. Neck supple.  Neurological: He is alert.  Skin: No rash noted.  On the right medial chest wall, there is a recently incised area approximately 3cm in length vertically with sutures in place.  Small amount of blood oozing from it.  Minimal tenderness to palpation.  No crepitus or emphysema.   Psychiatric: He has a normal mood and affect.     ED Treatments / Results  Labs (all labs ordered are listed, but only abnormal results are displayed) Labs Reviewed - No data to display  EKG None  Radiology No  results found.  Procedures Procedures (including critical care time)  Medications Ordered in ED Medications - No data to display   Initial Impression / Assessment and Plan / ED Course  I have reviewed the triage vital signs and the nursing notes.  Pertinent labs & imaging results that were available during my care of the patient were reviewed by me and considered in my medical decision making (see chart for details).     BP (!) 154/87 (BP Location: Right Arm)   Pulse 80   Temp 99.1 F (37.3 C) (Oral)   Resp 20   SpO2 98%    Final Clinical Impressions(s) / ED Diagnoses   Final diagnoses:  Visit for wound check    ED Discharge Orders    None     7:15 PM Pt had his basal cell cancer excised earlier today by his dermatologist.  He is here due to blood oozing from the site, while being on Eliquis.  No other complaint.  We have applied quick clot powder as well as pressure dressing.  Will continue to monitor.   8:09 PM Pt have been monitored for the past hr after pressure dressing and quick clot powder applied.  No active bleeding.  Pt stable for discharge.  Care instruction given.  Care discussed with Dr. Maryan Rued.   Domenic Moras, PA-C 11/12/17 2010    Blanchie Dessert, MD 11/12/17 [phone]

## 2017-11-12 NOTE — ED Notes (Addendum)
Cody Hodge, [name] came in to assess patient due to bleeding immediately after triaging. He verbally provided orders to apply a direct pressure dressing with the quick clot powder.

## 2017-11-12 NOTE — ED Triage Notes (Addendum)
Patient reports he had basal cell carcinoma removed from right side of chest today. Patient has 6 visible stitches and many internal stitches. However, he is bleeding from the site of the stitches. Patient reports he is on Eliquis. Bleeding is not controlled but applying pressure to the stitches. Procedure occurred today around 13:00.

## 2017-11-13 ENCOUNTER — Emergency Department (HOSPITAL_COMMUNITY)
Admission: EM | Admit: 2017-11-13 | Discharge: 2017-11-13 | Disposition: A | Discharge status: Home / Self Care | Payer: Medicare Other | Attending: Emergency Medicine | Admitting: Emergency Medicine

## 2017-11-13 ENCOUNTER — Encounter (HOSPITAL_COMMUNITY): Payer: Self-pay | Admitting: Emergency Medicine

## 2017-11-13 DIAGNOSIS — C44519 Basal cell carcinoma of skin of other part of trunk: Secondary | ICD-10-CM | POA: Insufficient documentation

## 2017-11-13 DIAGNOSIS — Z79899 Other long term (current) drug therapy: Secondary | ICD-10-CM | POA: Insufficient documentation

## 2017-11-13 DIAGNOSIS — Z7901 Long term (current) use of anticoagulants: Secondary | ICD-10-CM | POA: Insufficient documentation

## 2017-11-13 DIAGNOSIS — I4891 Unspecified atrial fibrillation: Secondary | ICD-10-CM | POA: Diagnosis not present

## 2017-11-13 DIAGNOSIS — I10 Essential (primary) hypertension: Secondary | ICD-10-CM | POA: Insufficient documentation

## 2017-11-13 DIAGNOSIS — T148XXA Other injury of unspecified body region, initial encounter: Secondary | ICD-10-CM

## 2017-11-13 DIAGNOSIS — L7621 Postprocedural hemorrhage and hematoma of skin and subcutaneous tissue following a dermatologic procedure: Secondary | ICD-10-CM | POA: Diagnosis not present

## 2017-11-13 DIAGNOSIS — S21111A Laceration without foreign body of right front wall of thorax without penetration into thoracic cavity, initial encounter: Secondary | ICD-10-CM | POA: Diagnosis not present

## 2017-11-13 MED ORDER — LIDOCAINE-EPINEPHRINE (PF) 2 %-1:200000 IJ SOLN
10.0000 mL | Freq: Once | INTRAMUSCULAR | Status: AC
  Administered 2017-11-13: 10 mL
  Filled 2017-11-13: qty 20

## 2017-11-13 MED ORDER — BACITRACIN ZINC 500 UNIT/GM EX OINT
TOPICAL_OINTMENT | CUTANEOUS | Status: AC
  Filled 2017-11-13: qty 2.7

## 2017-11-13 NOTE — ED Triage Notes (Addendum)
Pt was here yesterday with bleeding to right side of chest today. Patient had 6  stitches and many internal stitches. Bleeding from the site of the stitches. Patient reports he is on Eliquis. Bleeding is not controlled

## 2017-11-13 NOTE — ED Notes (Signed)
Bed: WTR5 Expected date:  Expected time:  Means of arrival:  Comments: 

## 2017-11-13 NOTE — Discharge Instructions (Signed)
Resume usual wound care. Have all the sutures removed as per your original instructions.

## 2017-11-13 NOTE — ED Provider Notes (Signed)
[facility] DEPT Provider Note   CSN: [id number] Arrival date & time: 11/13/17  0106     History   Chief Complaint Chief Complaint  Patient presents with  . Post-op Problem    HPI Cody Hodge is a 72 y.o. male.  The history is provided by the patient.  He has history of hypertension, hyperlipidemia, paroxysmal atrial fibrillation and is anticoagulated on apixaban.  He had a basal cell carcinoma removed from his chest earlier today and stitches were applied.  He had bleeding from the site and came to the ED were he had some procoagulant coagulant applied and was discharged.  He comes in with ongoing bleeding from the site.  Past Medical History:  Diagnosis Date  . Arthritis   . BPH (benign prostatic hyperplasia)   . Coronary artery disease 09/2014   50% mid to distal LAD, 50-70% ostial first diag, 40% ostial and mid left circ.  . ED (erectile dysfunction)   . Hemorrhoids    associated with bowel movements  . HTN (hypertension)   . Hypercholesteremia   . Knee arthropathy   . Melanoma ([place])   . Melanoma ([place])   . Mixed hyperlipidemia   . OSA (obstructive sleep apnea)    mild to moderate refused CPAP  . PAF (paroxysmal atrial fibrillation) (HCC)    CHADS VASC score 2 (age>65 and HTN)  . Tremor     Patient Active Problem List   Diagnosis Date Noted  . Angina pectoris ([place]) 03/16/2017  . Acute blood loss anemia 04/11/2015  . Dehydration 04/11/2015  . Constipation 04/11/2015  . Status post total bilateral knee replacement   . Postoperative anemia due to acute blood loss 04/03/2015  . Leucocytosis 04/03/2015  . OA (osteoarthritis) of knee 03/29/2015  . Abnormal myocardial perfusion study 10/06/2014  . Dyspnea on exertion 09/26/2014  . Family history of early CAD 09/26/2014  . HTN (hypertension)   . Mixed hyperlipidemia   . OSA (obstructive sleep apnea)   . PAF (paroxysmal atrial fibrillation) ([place])     Past Surgical History:    Procedure Laterality Date  . CARDIAC CATHETERIZATION     2'16- only ever heart cath  . CATARACT EXTRACTION, BILATERAL    . CORONARY STENT INTERVENTION N/A 03/18/2017   Procedure: CORONARY STENT INTERVENTION;  Surgeon: Nigel Mormon, MD;  Location: [facility];  Service: Cardiovascular;  Laterality: N/A;  . HYDROCELE EXCISION / REPAIR    . LASIK    . left and right arthroscopy    . left forearm melanoma    . LEFT HEART CATH AND CORONARY ANGIOGRAPHY N/A 03/18/2017   Procedure: Left Heart Cath and Coronary Angiography;  Surgeon: Nigel Mormon, MD;  Location: [facility];  Service: Cardiovascular;  Laterality: N/A;  . LEFT HEART CATHETERIZATION WITH CORONARY ANGIOGRAM N/A 10/06/2014   Procedure: LEFT HEART CATHETERIZATION WITH CORONARY ANGIOGRAM;  Surgeon: Sinclair Grooms, MD;  Location: [facility] CATH LAB;  Service: Cardiovascular;  Laterality: N/A;  . TOTAL KNEE ARTHROPLASTY Bilateral 03/29/2015   Procedure: TOTAL KNEE BILATERAL;  Surgeon: Gaynelle Arabian, MD;  Location: [facility];  Service: Orthopedics;  Laterality: Bilateral;  spinal anes. placed in [place] Medications    Prior to Admission medications   Medication Sig Start Date End Date Taking? Authorizing Provider  apixaban (ELIQUIS) 5 MG TABS tablet Take 5 mg by mouth 2 (two) times daily.    [provider]  atorvastatin (  LIPITOR) 20 MG tablet Take 20 mg by mouth daily at 6 PM.     [provider]  cholecalciferol (VITAMIN D) 1000 units tablet Take 1,000 Units by mouth daily.    [provider]  clopidogrel (PLAVIX) 75 MG tablet Take 1 tablet (75 mg total) by mouth daily. 03/18/17   Adrian Prows, MD  Coenzyme Q10 (CO Q-10) 200 MG CAPS Take 200 mg by mouth daily.    [provider]  ferrous sulfate 325 (65 FE) MG tablet Take 325 mg by mouth daily.    [provider]  finasteride (PROSCAR) 5 MG tablet Take 5 mg by mouth daily.  05/18/13   [provider]   flecainide (TAMBOCOR) 100 MG tablet Take 50 mg by mouth 2 (two) times daily.  03/14/15   [provider]  HYDROcodone-acetaminophen (NORCO/VICODIN) 5-325 MG tablet Take 1-2 tablets by mouth every 6 (six) hours as needed for severe pain. 04/23/17   Joy, Shawn C, PA-C  ibuprofen (ADVIL,MOTRIN) 200 MG tablet Take 400 mg by mouth 2 (two) times daily as needed for moderate pain.    [provider]  metoprolol succinate (TOPROL-XL) 25 MG 24 hr tablet Take 1 tablet (25 mg total) by mouth daily. 03/18/17   Adrian Prows, MD  Multiple Vitamin (MULTIVITAMIN WITH MINERALS) TABS tablet Take 1 tablet by mouth daily.    [provider]  Polyethyl Glycol-Propyl Glycol (SYSTANE OP) Place 1 drop into both eyes 2 (two) times daily.     [provider]  Resveratrol 250 MG CAPS Take 250 mg by mouth 2 (two) times daily.    [provider]  TURMERIC PO Take 1 capsule by mouth 2 (two) times daily.     [provider]    Family History Family History  Problem Relation Age of Onset  . Dementia Mother   . Heart attack Father   . Heart disease Father     Social History Social History   Tobacco Use  . Smoking status: Never Smoker  . Smokeless tobacco: Never Used  Substance Use Topics  . Alcohol use: Yes    Comment: 4 drinks per week  . Drug use: No     Allergies   Patient has no known allergies.   Review of Systems Review of Systems  All other systems reviewed and are negative.    Physical Exam Updated Vital Signs BP 138/76 (BP Location: Right Arm)   Pulse 78   Temp 98.7 F (37.1 C) (Oral)   Resp 14   SpO2 98%   Physical Exam  Nursing note and vitals reviewed.  73 year old male, resting comfortably and in no acute distress. Vital signs are normal. Oxygen saturation is 98%, which is normal. Head is normocephalic and atraumatic. PERRLA, EOMI. Oropharynx is clear. Neck is nontender and supple without adenopathy or JVD. Back is nontender and  there is no CVA tenderness. Lungs are clear without rales, wheezes, or rhonchi. Chest: Dressing is in place over the anterior chest with some blood dripping.  Dressing is removed and a large clot is identified.  Clot is removed and the suture line is identified with oozing of blood through the incision. Heart has regular rate and rhythm without murmur. Abdomen is soft, flat, nontender without masses or hepatosplenomegaly and peristalsis is normoactive. Extremities have no cyanosis or edema, full range of motion is present. Skin is warm and dry without rash. Neurologic: Mental status is normal, cranial nerves are intact, there are no  motor or sensory deficits.  ED Treatments / Results   Procedures Procedures Procedures .Surgical Wound closure Date/Time: 11/13/2017 2:50 AM Performed by: Delora Fuel, MD Authorized by: Delora Fuel, MD   Consent:    Consent obtained:  Verbal   Consent given by:  Patient   Risks discussed:  Infection and pain   Alternatives discussed:  No treatment Anesthesia (see MAR for exact dosages):    Anesthesia method:  Local infiltration   Local anesthetic:  Lidocaine 2% WITH epi Laceration details:    Location:  Trunk   Trunk location:  R chest   Length (cm):  3 Repair type:    Repair type:  Simple Exploration:    Hemostasis achieved with:  Direct pressure   Contaminated: no   Treatment:    Area cleansed with:  Shur-Clens   Amount of cleaning:  Standard Skin repair:    Repair method:  Sutures   Suture size:  4-0   Suture material:  Nylon   Number of sutures:  7 Approximation:    Approximation:  Close Post-procedure details:    Dressing:  Antibiotic ointment   Patient tolerance of procedure:  Tolerated well, no immediate complications Please note: there were sutures present which were not removed. Following placement of 6 additional sutures, there was still oozing from one location. A single figure of eight suture was placed there, and he had good  hemostasis. Wound was observed for 20 minutes with no bleeding.  Medications Ordered in ED Medications  lidocaine-EPINEPHrine (XYLOCAINE W/EPI) 2 %-1:200000 (PF) injection 10 mL (has no administration in time range)     Initial Impression / Assessment and Plan / ED Course  I have reviewed the triage vital signs and the nursing notes.  Bleeding from site of excision of basal cell carcinoma from the anterior chest wall.  I do not believe that thrombin will be effective, since the bleeding site seems to be from within the wound.  Will add additional sutures to try to get adequate compression of bleeding site.  Old records are reviewed confirming ED visit earlier today for bleeding from incision, treated with quick clot powder.  Final Clinical Impressions(s) / ED Diagnoses   Final diagnoses:  Bleeding from wound    ED Discharge Orders    None       Delora Fuel, MD 15/72/62 [phone]

## 2017-11-20 ENCOUNTER — Ambulatory Visit (HOSPITAL_COMMUNITY)
Admission: RE | Admit: 2017-11-20 | Discharge: 2017-11-20 | Disposition: A | Discharge status: Home / Self Care | Payer: Medicare Other | Source: Ambulatory Visit | Attending: Cardiology | Admitting: Cardiology

## 2017-11-20 ENCOUNTER — Other Ambulatory Visit (HOSPITAL_COMMUNITY): Payer: Self-pay | Admitting: Orthopedic Surgery

## 2017-11-20 DIAGNOSIS — M79604 Pain in right leg: Secondary | ICD-10-CM | POA: Diagnosis not present

## 2017-11-20 DIAGNOSIS — S8991XA Unspecified injury of right lower leg, initial encounter: Secondary | ICD-10-CM | POA: Diagnosis not present

## 2017-11-20 DIAGNOSIS — M79671 Pain in right foot: Secondary | ICD-10-CM | POA: Diagnosis not present

## 2017-11-20 DIAGNOSIS — M79661 Pain in right lower leg: Secondary | ICD-10-CM | POA: Insufficient documentation

## 2017-11-20 DIAGNOSIS — M25571 Pain in right ankle and joints of right foot: Secondary | ICD-10-CM | POA: Diagnosis not present

## 2017-11-20 DIAGNOSIS — M79662 Pain in left lower leg: Secondary | ICD-10-CM | POA: Insufficient documentation

## 2017-11-20 DIAGNOSIS — M79605 Pain in left leg: Secondary | ICD-10-CM | POA: Insufficient documentation

## 2017-11-24 DIAGNOSIS — I82401 Acute embolism and thrombosis of unspecified deep veins of right lower extremity: Secondary | ICD-10-CM | POA: Diagnosis not present

## 2017-12-06 ENCOUNTER — Emergency Department (HOSPITAL_COMMUNITY)
Admission: EM | Admit: 2017-12-06 | Discharge: 2017-12-07 | Disposition: A | Discharge status: Home / Self Care | Payer: Medicare Other | Attending: Emergency Medicine | Admitting: Emergency Medicine

## 2017-12-06 ENCOUNTER — Other Ambulatory Visit: Payer: Self-pay

## 2017-12-06 ENCOUNTER — Encounter (HOSPITAL_COMMUNITY): Payer: Self-pay | Admitting: *Deleted

## 2017-12-06 ENCOUNTER — Ambulatory Visit (HOSPITAL_COMMUNITY)
Admission: EM | Admit: 2017-12-06 | Discharge: 2017-12-06 | Disposition: A | Discharge status: Other Acute Inpatient Hospital | Payer: No Typology Code available for payment source

## 2017-12-06 ENCOUNTER — Emergency Department (HOSPITAL_COMMUNITY): Payer: Medicare Other

## 2017-12-06 DIAGNOSIS — I10 Essential (primary) hypertension: Secondary | ICD-10-CM | POA: Insufficient documentation

## 2017-12-06 DIAGNOSIS — Z7902 Long term (current) use of antithrombotics/antiplatelets: Secondary | ICD-10-CM | POA: Insufficient documentation

## 2017-12-06 DIAGNOSIS — R079 Chest pain, unspecified: Secondary | ICD-10-CM | POA: Diagnosis not present

## 2017-12-06 DIAGNOSIS — Z96653 Presence of artificial knee joint, bilateral: Secondary | ICD-10-CM | POA: Diagnosis not present

## 2017-12-06 DIAGNOSIS — Z9861 Coronary angioplasty status: Secondary | ICD-10-CM | POA: Diagnosis not present

## 2017-12-06 DIAGNOSIS — Z79899 Other long term (current) drug therapy: Secondary | ICD-10-CM | POA: Insufficient documentation

## 2017-12-06 DIAGNOSIS — R0602 Shortness of breath: Secondary | ICD-10-CM | POA: Diagnosis not present

## 2017-12-06 DIAGNOSIS — R0789 Other chest pain: Secondary | ICD-10-CM

## 2017-12-06 LAB — I-STAT TROPONIN, ED: Troponin i, poc: 0 ng/mL (ref 0.00–0.08)

## 2017-12-06 LAB — CBC
HEMATOCRIT: 38 % — AB (ref 39.0–52.0)
HEMOGLOBIN: 12.5 g/dL — AB (ref 13.0–17.0)
MCH: 27.7 pg (ref 26.0–34.0)
MCHC: 32.9 g/dL (ref 30.0–36.0)
MCV: 84.3 fL (ref 78.0–100.0)
Platelets: 346 10*3/uL (ref 150–400)
RBC: 4.51 MIL/uL (ref 4.22–5.81)
RDW: 13.7 % (ref 11.5–15.5)
WBC: 10.5 10*3/uL (ref 4.0–10.5)

## 2017-12-06 LAB — BASIC METABOLIC PANEL
ANION GAP: 8 (ref 5–15)
BUN: 24 mg/dL — ABNORMAL HIGH (ref 6–20)
CALCIUM: 9 mg/dL (ref 8.9–10.3)
CHLORIDE: 101 mmol/L (ref 101–111)
CO2: 27 mmol/L (ref 22–32)
Creatinine, Ser: 1.03 mg/dL (ref 0.61–1.24)
GFR calc non Af Amer: >60 mL/min (ref >60)
Glucose, Bld: 103 mg/dL — ABNORMAL HIGH (ref 65–99)
POTASSIUM: 3.8 mmol/L (ref 3.5–5.1)
Sodium: 136 mmol/L (ref 135–145)

## 2017-12-06 LAB — TROPONIN I

## 2017-12-06 MED ORDER — IOPAMIDOL (ISOVUE-370) INJECTION 76%
100.0000 mL | Freq: Once | INTRAVENOUS | Status: AC | PRN
  Administered 2017-12-06: 100 mL via INTRAVENOUS

## 2017-12-06 MED ORDER — IOPAMIDOL (ISOVUE-370) INJECTION 76%
INTRAVENOUS | Status: AC
  Filled 2017-12-06: qty 100

## 2017-12-06 NOTE — ED Provider Notes (Signed)
[facility] EMERGENCY DEPARTMENT Provider Note   CSN: [id number] Arrival date & time: 12/06/17  1924     History   Chief Complaint Chief Complaint  Patient presents with  . Chest Pain    HPI Cody Hodge is a 72 y.o. male.  HPI Patient presents with concern of chest pain, back pain, dyspnea. Onset was today, about 15 hours prior to ED arrival. Patient went to bed in his usual state of yesterday. However, the patient does have recent diagnosis of DVT, has a history of CAD as well. He is currently taking Eliquis. Since onset  earlier today symptoms have been persistent, in spite of rest.  On no medication taken for relief. Patient spoke with his cardiology team, was sent here for evaluation. Past Medical History:  Diagnosis Date  . Arthritis   . BPH (benign prostatic hyperplasia)   . Coronary artery disease 09/2014   50% mid to distal LAD, 50-70% ostial first diag, 40% ostial and mid left circ.  . ED (erectile dysfunction)   . Hemorrhoids    associated with bowel movements  . HTN (hypertension)   . Hypercholesteremia   . Knee arthropathy   . Melanoma ([place])   . Melanoma ([place])   . Mixed hyperlipidemia   . OSA (obstructive sleep apnea)    mild to moderate refused CPAP  . PAF (paroxysmal atrial fibrillation) (HCC)    CHADS VASC score 2 (age>65 and HTN)  . Tremor     Patient Active Problem List   Diagnosis Date Noted  . Angina pectoris ([place]) 03/16/2017  . Acute blood loss anemia 04/11/2015  . Dehydration 04/11/2015  . Constipation 04/11/2015  . Status post total bilateral knee replacement   . Postoperative anemia due to acute blood loss 04/03/2015  . Leucocytosis 04/03/2015  . OA (osteoarthritis) of knee 03/29/2015  . Abnormal myocardial perfusion study 10/06/2014  . Dyspnea on exertion 09/26/2014  . Family history of early CAD 09/26/2014  . HTN (hypertension)   . Mixed hyperlipidemia   . OSA (obstructive sleep apnea)   . PAF (paroxysmal  atrial fibrillation) ([place])     Past Surgical History:  Procedure Laterality Date  . CARDIAC CATHETERIZATION     2'16- only ever heart cath  . CATARACT EXTRACTION, BILATERAL    . CORONARY STENT INTERVENTION N/A 03/18/2017   Procedure: CORONARY STENT INTERVENTION;  Surgeon: Nigel Mormon, MD;  Location: [facility];  Service: Cardiovascular;  Laterality: N/A;  . HYDROCELE EXCISION / REPAIR    . LASIK    . left and right arthroscopy    . left forearm melanoma    . LEFT HEART CATH AND CORONARY ANGIOGRAPHY N/A 03/18/2017   Procedure: Left Heart Cath and Coronary Angiography;  Surgeon: Nigel Mormon, MD;  Location: [facility];  Service: Cardiovascular;  Laterality: N/A;  . LEFT HEART CATHETERIZATION WITH CORONARY ANGIOGRAM N/A 10/06/2014   Procedure: LEFT HEART CATHETERIZATION WITH CORONARY ANGIOGRAM;  Surgeon: Sinclair Grooms, MD;  Location: [facility] CATH LAB;  Service: Cardiovascular;  Laterality: N/A;  . TOTAL KNEE ARTHROPLASTY Bilateral 03/29/2015   Procedure: TOTAL KNEE BILATERAL;  Surgeon: Gaynelle Arabian, MD;  Location: [facility];  Service: Orthopedics;  Laterality: Bilateral;  spinal anes. placed in [place] Medications    Prior to Admission medications   Medication Sig Start Date End Date Taking? Authorizing Provider  apixaban (ELIQUIS) 5 MG TABS tablet Take 5 mg by mouth 2 (  two) times daily.   Yes [provider]  atorvastatin (LIPITOR) 20 MG tablet Take 20 mg by mouth every morning.    Yes [provider]  cholecalciferol (VITAMIN D) 1000 units tablet Take 1,000 Units by mouth daily.   Yes [provider]  clopidogrel (PLAVIX) 75 MG tablet Take 1 tablet (75 mg total) by mouth daily. 03/18/17  Yes Adrian Prows, MD  Coenzyme Q10 (CO Q-10) 200 MG CAPS Take 200 mg by mouth daily.   Yes [provider]  ferrous sulfate 325 (65 FE) MG tablet Take 325 mg by mouth daily.   Yes [provider]  finasteride (PROSCAR) 5 MG  tablet Take 5 mg by mouth daily.  05/18/13  Yes [provider]  flecainide (TAMBOCOR) 100 MG tablet Take 50 mg by mouth 2 (two) times daily.  03/14/15  Yes [provider]  HYDROcodone-acetaminophen (NORCO/VICODIN) 5-325 MG tablet Take 1-2 tablets by mouth every 6 (six) hours as needed for severe pain. 04/23/17  Yes Joy, Shawn C, PA-C  ibuprofen (ADVIL,MOTRIN) 200 MG tablet Take 400 mg by mouth 2 (two) times daily as needed for moderate pain.   Yes [provider]  metoprolol succinate (TOPROL-XL) 25 MG 24 hr tablet Take 1 tablet (25 mg total) by mouth daily. 03/18/17  Yes Adrian Prows, MD  Multiple Vitamin (MULTIVITAMIN WITH MINERALS) TABS tablet Take 1 tablet by mouth daily.   Yes [provider]  OVER THE COUNTER MEDICATION Take 2 capsules by mouth every morning. elysium   Yes [provider]  Polyethyl Glycol-Propyl Glycol (SYSTANE OP) Place 1 drop into both eyes 2 (two) times daily.    Yes [provider]  Resveratrol 250 MG CAPS Take 250 mg by mouth 2 (two) times daily.   Yes [provider]  TURMERIC PO Take 1 capsule by mouth 2 (two) times daily.    Yes [provider]    Family History Family History  Problem Relation Age of Onset  . Dementia Mother   . Heart attack Father   . Heart disease Father     Social History Social History   Tobacco Use  . Smoking status: Never Smoker  . Smokeless tobacco: Never Used  Substance Use Topics  . Alcohol use: Yes    Comment: 4 drinks per week  . Drug use: No     Allergies   Patient has no known allergies.   Review of Systems Review of Systems  Constitutional:       Per HPI, otherwise negative  HENT:       Per HPI, otherwise negative  Respiratory:       Per HPI, otherwise negative  Cardiovascular:       Per HPI, otherwise negative  Gastrointestinal: Negative for vomiting.  Endocrine:       Negative aside from HPI  Genitourinary:       Neg aside from HPI     Musculoskeletal:       Right Achilles tendon rupture  Skin: Negative.   Neurological: Negative for syncope.  Hematological: Bruises/bleeds easily.     Physical Exam Updated Vital Signs BP (!) 154/83   Pulse 85   Temp 99.8 F (37.7 C) (Oral)   Resp (!) 21   Ht 5\' 10"  (1.778 m)   Wt 86.2 kg (190 lb)   SpO2 97%   BMI 27.26 kg/m   Physical Exam  Constitutional: He is oriented to person, place, and time. He appears well-developed. No distress.  HENT:  Head: Normocephalic and atraumatic.  Eyes: Conjunctivae and EOM are normal.  Cardiovascular: Normal rate and regular rhythm.  Pulmonary/Chest: Effort normal. No stridor. No respiratory distress.  Abdominal: He exhibits no distension.  Musculoskeletal: He exhibits no edema.  Right distal lower extremity in immobilizer  Neurological: He is alert and oriented to person, place, and time.  Skin: Skin is warm and dry.  Psychiatric: He has a normal mood and affect.  Nursing note and vitals reviewed.    ED Treatments / Results  Labs (all labs ordered are listed, but only abnormal results are displayed) Labs Reviewed  BASIC METABOLIC PANEL - Abnormal; Notable for the following components:      Result Value   Glucose, Bld 103 (*)    BUN 24 (*)    All other components within normal limits  CBC - Abnormal; Notable for the following components:   Hemoglobin 12.5 (*)    HCT 38.0 (*)    All other components within normal limits  TROPONIN I  I-STAT TROPONIN, ED    EKG EKG Interpretation #1  Date/Time:  Saturday December 06 2017 19:26:34 EDT Ventricular Rate:  81 PR Interval:  186 QRS Duration: 92 QT Interval:  366 QTC Calculation: 425 R Axis:   73 Text Interpretation:  Normal sinus rhythm ST-t wave abnormality Abnormal ekg Confirmed by Carmin Muskrat [phone]) on 12/06/2017 7:57:45 PM   EKG Interpretation #2  Date/Time:  Saturday December 06 2017 22:46:37 EDT Ventricular Rate:  86 PR Interval:  186 QRS Duration: 97 QT  Interval:  365 QTC Calculation: 437 R Axis:   80 Text Interpretation:  Sinus rhythm ST-t wave abnormality Abnormal ekg Confirmed by Carmin Muskrat [phone]) on 12/06/2017 10:58:04 PM         Radiology Dg Chest 2 View  Result Date: 12/06/2017 CLINICAL DATA:  The pt is c/o posterior chest pain since this am with sob intermittently all day He has taken advil with some relief he has a known dvt in his rt lower leg. He is on eliquist EXAM: CHEST - 2 VIEW COMPARISON:  10/28/2012 FINDINGS: Mild stable scarring at the left lung base.  Lungs otherwise clear. Normal heart, mediastinum and hila. No pleural effusion or pneumothorax. Skeletal structures are intact. IMPRESSION: No active cardiopulmonary disease. Electronically Signed   By: Lajean Manes M.D.   On: 12/06/2017 20:13   Ct Angio Chest Pe W/cm &/or Wo Cm  Result Date: 12/06/2017 CLINICAL DATA:  Posterior chest pain since this morning which shortness-of-breath intermittently during the day. Known DVT right lower leg on Eliquis. EXAM: CT ANGIOGRAPHY CHEST WITH CONTRAST TECHNIQUE: Multidetector CT imaging of the chest was performed using the standard protocol during bolus administration of intravenous contrast. Multiplanar CT image reconstructions and MIPs were obtained to evaluate the vascular anatomy. CONTRAST:  167mL ISOVUE-370 IOPAMIDOL (ISOVUE-370) INJECTION 76% COMPARISON:  None. FINDINGS: Cardiovascular: Heart is normal size. Mild calcified plaque over the left main and 3 vessel coronary arteries. No evidence of pulmonary embolism. Mediastinum/Nodes: No evidence of mediastinal or hilar adenopathy. Remaining mediastinal structures are within normal. Lungs/Pleura: Lungs are adequately inflated with mild left base linear opacification likely atelectasis. No evidence of effusion or pneumothorax. Airways are normal. Upper Abdomen: Diverticulosis of the colon. 1.5 cm cyst over the mid to upper pole right kidney. Musculoskeletal: Degenerative change of the  spine. Review of the MIP images confirms the above findings. IMPRESSION: No evidence of pulmonary embolism and no acute cardiopulmonary disease. Linear atelectasis left base. Atherosclerotic coronary artery  disease. 1.5 cm right renal cyst.  Diverticulosis of the colon. Electronically Signed   By: Marin Olp M.D.   On: 12/06/2017 23:24    Procedures Procedures (including critical care time)  Medications Ordered in ED Medications  iopamidol (ISOVUE-370) 76 % injection 100 mL (100 mLs Intravenous Contrast Given 12/06/17 2232)     Initial Impression / Assessment and Plan / ED Course  I have reviewed the triage vital signs and the nursing notes.  Pertinent labs & imaging results that were available during my care of the patient were reviewed by me and considered in my medical decision making (see chart for details).   I discussed the patient's case with his cardiology team. We discussed the indications for CT angiography given the patient's history of DVT, only recent initiation of anti-thrombotic medication.     12:06 AM Patient is awake alert calm.  CT angiography negative for PE, troponin 0, EKG is unchanged after several hours of here. He does have one isolated ST elevation, but no depressions, no substantial changes from baseline. With no ongoing complaints, reassuring CT, labs, vitals, absence of findings after hours of monitoring, we discussed admission versus discharge, patient will be discharged to follow-up with his cardiologist in 48hours.   Final Clinical Impressions(s) / ED Diagnoses   Final diagnoses:  Atypical chest pain    ED Discharge Orders    None       Carmin Muskrat, MD 12/07/17 0007

## 2017-12-06 NOTE — ED Triage Notes (Signed)
The pt is c/o posterior chest pain since this am with sob intermittently all day  He has taken advil with some relief he has a known dvt in his rt lower leg h is on  eliquist

## 2017-12-06 NOTE — Consult Note (Signed)
Reason for Consult: Shortness of breath Referring Physician: [facility]  Cody Hodge is an 72 y.o. male.  HPI:    72 year old Caucasian male with coronary artery disease status post right posterolateral branch PCI 3.0 x 20 mm synergy drug-eluting stent in 03/2017, residual moderate to severe disease in small left circumflex and diagonal branches treated medically, paroxysmal atrial fibrillation, recent acute DVT in proximal and mid peroneal veins on the right, history of melanoma, recent removal of basal cell carcinoma from chest wall. Patient called today with complaints of back pain since morning and independent shortness of breath, as well as mild grade fever of 100 F.   On arrival to the emergency department, patient is chest pain free. He describes his lateral chest wall and back pain that is pleuritic in nature, intermittent since this morning.  He recently had acute peroneal DVT for which he was treated with increased dose of eliquis 10 mg bid.   Two EKG's 90 min apart show no dynamic changes, but have mild ST elevation in isolated lead II without any reciprocal ST depressions, no Q waves. Trop negative. Bedside echocardiogram by me shows normal systolic function with no wall motion abnormality. No pericardial effusion.    Past Medical History:  Diagnosis Date  . Arthritis   . BPH (benign prostatic hyperplasia)   . Coronary artery disease 09/2014   50% mid to distal LAD, 50-70% ostial first diag, 40% ostial and mid left circ.  . ED (erectile dysfunction)   . Hemorrhoids    associated with bowel movements  . HTN (hypertension)   . Hypercholesteremia   . Knee arthropathy   . Melanoma ([place])   . Melanoma ([place])   . Mixed hyperlipidemia   . OSA (obstructive sleep apnea)    mild to moderate refused CPAP  . PAF (paroxysmal atrial fibrillation) (HCC)    CHADS VASC score 2 (age>65 and HTN)  . Tremor     Past Surgical History:  Procedure Laterality Date  . CARDIAC  CATHETERIZATION     2'16- only ever heart cath  . CATARACT EXTRACTION, BILATERAL    . CORONARY STENT INTERVENTION N/A 03/18/2017   Procedure: CORONARY STENT INTERVENTION;  Surgeon: Nigel Mormon, MD;  Location: [facility];  Service: Cardiovascular;  Laterality: N/A;  . HYDROCELE EXCISION / REPAIR    . LASIK    . left and right arthroscopy    . left forearm melanoma    . LEFT HEART CATH AND CORONARY ANGIOGRAPHY N/A 03/18/2017   Procedure: Left Heart Cath and Coronary Angiography;  Surgeon: Nigel Mormon, MD;  Location: [facility];  Service: Cardiovascular;  Laterality: N/A;  . LEFT HEART CATHETERIZATION WITH CORONARY ANGIOGRAM N/A 10/06/2014   Procedure: LEFT HEART CATHETERIZATION WITH CORONARY ANGIOGRAM;  Surgeon: Sinclair Grooms, MD;  Location: [facility];  Service: Cardiovascular;  Laterality: N/A;  . TOTAL KNEE ARTHROPLASTY Bilateral 03/29/2015   Procedure: TOTAL KNEE BILATERAL;  Surgeon: Gaynelle Arabian, MD;  Location: [facility];  Service: Orthopedics;  Laterality: Bilateral;  spinal anes. placed in OR    Family History  Problem Relation Age of Onset  . Dementia Mother   . Heart attack Father   . Heart disease Father     Social History:  reports that he has never smoked. He has never used smokeless tobacco. He reports that he drinks alcohol. He reports that he does not use drugs.  Allergies: No Known Allergies  Medications: I have reviewed the  patient's current medications.  Results for orders placed or performed during the hospital encounter of 12/06/17 (from the past 48 hour(s))  Basic metabolic panel     Status: Abnormal   Collection Time: 12/06/17  7:46 PM  Result Value Ref Range   Sodium 136 135 - 145 mmol/L   Potassium 3.8 3.5 - 5.1 mmol/L   Chloride 101 101 - 111 mmol/L   CO2 27 22 - 32 mmol/L   Glucose, Bld 103 (H) 65 - 99 mg/dL   BUN 24 (H) 6 - 20 mg/dL   Creatinine, Ser 1.03 0.61 - 1.24 mg/dL   Calcium 9.0 8.9 - 10.3 mg/dL   GFR calc non  Af Amer >60 >60 mL/min   GFR calc Af Amer >60 >60 mL/min    Comment: (NOTE) The eGFR has been calculated using the CKD EPI equation. This calculation has not been validated in all clinical situations. eGFR's persistently <60 mL/min signify possible Chronic Kidney Disease.    Anion gap 8 5 - 15    Comment: Performed at [facility] [address], [place], [place] [address]  CBC     Status: Abnormal   Collection Time: 12/06/17  7:46 PM  Result Value Ref Range   WBC 10.5 4.0 - 10.5 K/uL   RBC 4.51 4.22 - 5.81 MIL/uL   Hemoglobin 12.5 (L) 13.0 - 17.0 g/dL   HCT 38.0 (L) 39.0 - 52.0 %   MCV 84.3 78.0 - 100.0 fL   MCH 27.7 26.0 - 34.0 pg   MCHC 32.9 30.0 - 36.0 g/dL   RDW 13.7 11.5 - 15.5 %   Platelets 346 150 - 400 K/uL    Comment: Performed at [facility], [place] [address] [place], [place] [address]  Troponin I     Status: None   Collection Time: 12/06/17  7:46 PM  Result Value Ref Range   Troponin I <0.03 <0.03 ng/mL    Comment: Performed at [facility] [address], [place],  [address]    Dg Chest 2 View  Result Date: 12/06/2017 CLINICAL DATA:  The pt is c/o posterior chest pain since this am with sob intermittently all day He has taken advil with some relief he has a known dvt in his rt lower leg. He is on eliquist EXAM: CHEST - 2 VIEW COMPARISON:  10/28/2012 FINDINGS: Mild stable scarring at the left lung base.  Lungs otherwise clear. Normal heart, mediastinum and hila. No pleural effusion or pneumothorax. Skeletal structures are intact. IMPRESSION: No active cardiopulmonary disease. Electronically Signed   By: Lajean Manes M.D.   On: 12/06/2017 20:13    Review of Systems  Constitutional: Positive for fever (100 F at home). Negative for malaise/fatigue and weight loss.  HENT: Negative.   Eyes: Negative.   Respiratory: Positive for shortness of breath (Intermittent).   Cardiovascular: Positive for chest pain. Negative for palpitations.   Gastrointestinal: Negative for diarrhea and nausea.  Genitourinary: Negative.   Musculoskeletal:       Recent Achilles tendon repair (Rt) Recent Rt shoulder dislocation  Skin: Negative.   Neurological: Negative for loss of consciousness.  Endo/Heme/Allergies: Negative for environmental allergies.  Psychiatric/Behavioral: Negative.   All other systems reviewed and are negative.  Blood pressure 139/78, pulse 78, temperature 99.8 F (37.7 C), temperature source Oral, resp. rate 16, height '5\' 10"'$  (1.778 m), weight 86.2 kg (190 lb), SpO2 97 %. Physical Exam  Nursing note and vitals reviewed. Constitutional: He is  oriented to person, place, and time. He appears well-developed and well-nourished. No distress.  HENT:  Head: Normocephalic and atraumatic.  Eyes: Pupils are equal, round, and reactive to light. Conjunctivae are normal.  Neck: No JVD present.  Cardiovascular: Normal rate, regular rhythm and normal heart sounds.  No murmur heard. Respiratory: Effort normal and breath sounds normal. He has no wheezes. He has no rales.  GI: Soft. Bowel sounds are normal. There is no tenderness. There is no rebound.  Musculoskeletal: He exhibits no edema.  Neurological: He is alert and oriented to person, place, and time. No cranial nerve deficit.  Skin: Skin is warm and dry.    Assessment: 72 y/o Caucasian male Chest pain, shortness of breath: Pleuritic. EKG does not meet STEMI criteria. Echocardiogram shows normal wall motion. No pericardial effusion. Bilateral equal pulses. No AI murmur. Dissection unlikely. CTA is reasonable given his recent DVT, in spite being on eliquis.  H/o melanoma H/o basal cell carcinoma  Recommendations: If CTA negative, recommend colchicine 0.6 mg bid for treatment of acute pericarditis. Avoid high dose NSAIDS/asoirin givne his ongoing use of eliquis and plavix. Recommend tylenol for pain. Continue eliquis at 5 mg bid, if negative CTA for PE. Would increase Eliquis  to 10 mg bid in case of PE.  Continue plavix Continue baseline cardiac medications.   Lorae Roig J Khallid Pasillas 12/06/2017, 9:23 PM   [place], MD [facility]. [place] Pager: [phone] Office: [phone] If no answer Cell [phone]

## 2017-12-07 NOTE — Discharge Instructions (Addendum)
As discussed, your evaluation today has been largely reassuring.  But, it is important that you monitor your condition carefully, and do not hesitate to return to the ED if you develop new, or concerning changes in your condition. ? ?Otherwise, please follow-up with your physician for appropriate ongoing care. ? ?

## 2017-12-16 DIAGNOSIS — S8991XA Unspecified injury of right lower leg, initial encounter: Secondary | ICD-10-CM | POA: Diagnosis not present

## 2017-12-16 DIAGNOSIS — M25511 Pain in right shoulder: Secondary | ICD-10-CM | POA: Diagnosis not present

## 2017-12-23 DIAGNOSIS — M25511 Pain in right shoulder: Secondary | ICD-10-CM | POA: Diagnosis not present

## 2017-12-30 DIAGNOSIS — S86111D Strain of other muscle(s) and tendon(s) of posterior muscle group at lower leg level, right leg, subsequent encounter: Secondary | ICD-10-CM | POA: Diagnosis not present

## 2017-12-30 DIAGNOSIS — M25511 Pain in right shoulder: Secondary | ICD-10-CM | POA: Diagnosis not present

## 2017-12-30 DIAGNOSIS — M19011 Primary osteoarthritis, right shoulder: Secondary | ICD-10-CM | POA: Diagnosis not present

## 2018-02-05 DIAGNOSIS — R972 Elevated prostate specific antigen [PSA]: Secondary | ICD-10-CM | POA: Diagnosis not present

## 2018-02-20 DIAGNOSIS — Z1159 Encounter for screening for other viral diseases: Secondary | ICD-10-CM | POA: Diagnosis not present

## 2018-02-20 DIAGNOSIS — E7800 Pure hypercholesterolemia, unspecified: Secondary | ICD-10-CM | POA: Diagnosis not present

## 2018-02-20 DIAGNOSIS — Z23 Encounter for immunization: Secondary | ICD-10-CM | POA: Diagnosis not present

## 2018-02-20 DIAGNOSIS — D649 Anemia, unspecified: Secondary | ICD-10-CM | POA: Diagnosis not present

## 2018-02-20 DIAGNOSIS — K648 Other hemorrhoids: Secondary | ICD-10-CM | POA: Diagnosis not present

## 2018-02-20 DIAGNOSIS — L309 Dermatitis, unspecified: Secondary | ICD-10-CM | POA: Diagnosis not present

## 2018-02-20 DIAGNOSIS — Z0000 Encounter for general adult medical examination without abnormal findings: Secondary | ICD-10-CM | POA: Diagnosis not present

## 2018-02-20 DIAGNOSIS — Z136 Encounter for screening for cardiovascular disorders: Secondary | ICD-10-CM | POA: Diagnosis not present

## 2018-02-20 DIAGNOSIS — Z85828 Personal history of other malignant neoplasm of skin: Secondary | ICD-10-CM | POA: Diagnosis not present

## 2018-02-26 DIAGNOSIS — H34232 Retinal artery branch occlusion, left eye: Secondary | ICD-10-CM | POA: Diagnosis not present

## 2018-02-26 DIAGNOSIS — H5310 Unspecified subjective visual disturbances: Secondary | ICD-10-CM | POA: Diagnosis not present

## 2018-02-26 DIAGNOSIS — H43393 Other vitreous opacities, bilateral: Secondary | ICD-10-CM | POA: Diagnosis not present

## 2018-02-26 DIAGNOSIS — Z9889 Other specified postprocedural states: Secondary | ICD-10-CM | POA: Diagnosis not present

## 2018-02-26 DIAGNOSIS — Z961 Presence of intraocular lens: Secondary | ICD-10-CM | POA: Diagnosis not present

## 2018-03-12 DIAGNOSIS — H43393 Other vitreous opacities, bilateral: Secondary | ICD-10-CM | POA: Diagnosis not present

## 2018-03-12 DIAGNOSIS — Z961 Presence of intraocular lens: Secondary | ICD-10-CM | POA: Diagnosis not present

## 2018-03-12 DIAGNOSIS — Z9889 Other specified postprocedural states: Secondary | ICD-10-CM | POA: Diagnosis not present

## 2018-03-12 DIAGNOSIS — H34232 Retinal artery branch occlusion, left eye: Secondary | ICD-10-CM | POA: Diagnosis not present

## 2018-03-23 DIAGNOSIS — R972 Elevated prostate specific antigen [PSA]: Secondary | ICD-10-CM | POA: Diagnosis not present

## 2018-03-23 DIAGNOSIS — R351 Nocturia: Secondary | ICD-10-CM | POA: Diagnosis not present

## 2018-03-23 DIAGNOSIS — N5201 Erectile dysfunction due to arterial insufficiency: Secondary | ICD-10-CM | POA: Diagnosis not present

## 2018-03-23 DIAGNOSIS — N401 Enlarged prostate with lower urinary tract symptoms: Secondary | ICD-10-CM | POA: Diagnosis not present

## 2018-04-23 DIAGNOSIS — D1801 Hemangioma of skin and subcutaneous tissue: Secondary | ICD-10-CM | POA: Diagnosis not present

## 2018-04-23 DIAGNOSIS — L821 Other seborrheic keratosis: Secondary | ICD-10-CM | POA: Diagnosis not present

## 2018-04-23 DIAGNOSIS — L918 Other hypertrophic disorders of the skin: Secondary | ICD-10-CM | POA: Diagnosis not present

## 2018-04-23 DIAGNOSIS — L570 Actinic keratosis: Secondary | ICD-10-CM | POA: Diagnosis not present

## 2018-04-23 DIAGNOSIS — Z23 Encounter for immunization: Secondary | ICD-10-CM | POA: Diagnosis not present

## 2018-04-23 DIAGNOSIS — L245 Irritant contact dermatitis due to other chemical products: Secondary | ICD-10-CM | POA: Diagnosis not present

## 2018-04-23 DIAGNOSIS — Z85820 Personal history of malignant melanoma of skin: Secondary | ICD-10-CM | POA: Diagnosis not present

## 2018-06-10 DIAGNOSIS — Z23 Encounter for immunization: Secondary | ICD-10-CM | POA: Diagnosis not present

## 2018-08-03 DIAGNOSIS — E7800 Pure hypercholesterolemia, unspecified: Secondary | ICD-10-CM | POA: Diagnosis not present

## 2018-08-03 DIAGNOSIS — I480 Paroxysmal atrial fibrillation: Secondary | ICD-10-CM | POA: Diagnosis not present

## 2018-08-03 DIAGNOSIS — H34212 Partial retinal artery occlusion, left eye: Secondary | ICD-10-CM | POA: Diagnosis not present

## 2018-08-03 DIAGNOSIS — I25119 Atherosclerotic heart disease of native coronary artery with unspecified angina pectoris: Secondary | ICD-10-CM | POA: Diagnosis not present

## 2018-08-18 DIAGNOSIS — I6523 Occlusion and stenosis of bilateral carotid arteries: Secondary | ICD-10-CM | POA: Diagnosis not present

## 2018-08-18 DIAGNOSIS — I4891 Unspecified atrial fibrillation: Secondary | ICD-10-CM | POA: Diagnosis not present

## 2018-08-18 DIAGNOSIS — Z955 Presence of coronary angioplasty implant and graft: Secondary | ICD-10-CM | POA: Diagnosis not present

## 2018-08-18 DIAGNOSIS — G453 Amaurosis fugax: Secondary | ICD-10-CM | POA: Diagnosis not present

## 2018-09-29 ENCOUNTER — Other Ambulatory Visit: Payer: Self-pay

## 2018-09-29 MED ORDER — RAMIPRIL 5 MG PO CAPS: 5.0000 mg | ORAL_CAPSULE | Freq: Every day | ORAL | 3 refills | Status: DC

## 2018-10-16 ENCOUNTER — Other Ambulatory Visit: Payer: Self-pay

## 2018-10-16 MED ORDER — ATORVASTATIN CALCIUM 20 MG PO TABS: 20.0000 mg | ORAL_TABLET | ORAL | 1 refills | Status: DC

## 2018-11-19 ENCOUNTER — Other Ambulatory Visit: Payer: Self-pay

## 2018-11-19 MED ORDER — METOPROLOL SUCCINATE ER 25 MG PO TB24: 25.0000 mg | ORAL_TABLET | Freq: Every day | ORAL | 0 refills | Status: DC

## 2018-11-27 ENCOUNTER — Other Ambulatory Visit: Payer: Self-pay

## 2018-11-27 DIAGNOSIS — E782 Mixed hyperlipidemia: Secondary | ICD-10-CM

## 2018-11-27 MED ORDER — ATORVASTATIN CALCIUM 20 MG PO TABS: 20.0000 mg | ORAL_TABLET | ORAL | 3 refills | Status: DC

## 2019-01-18 DIAGNOSIS — Z85820 Personal history of malignant melanoma of skin: Secondary | ICD-10-CM | POA: Diagnosis not present

## 2019-01-18 DIAGNOSIS — B351 Tinea unguium: Secondary | ICD-10-CM | POA: Diagnosis not present

## 2019-01-18 DIAGNOSIS — D692 Other nonthrombocytopenic purpura: Secondary | ICD-10-CM | POA: Diagnosis not present

## 2019-01-18 DIAGNOSIS — L570 Actinic keratosis: Secondary | ICD-10-CM | POA: Diagnosis not present

## 2019-01-18 DIAGNOSIS — L820 Inflamed seborrheic keratosis: Secondary | ICD-10-CM | POA: Diagnosis not present

## 2019-01-18 DIAGNOSIS — L821 Other seborrheic keratosis: Secondary | ICD-10-CM | POA: Diagnosis not present

## 2019-02-03 ENCOUNTER — Other Ambulatory Visit: Payer: Self-pay

## 2019-02-03 ENCOUNTER — Encounter: Payer: Self-pay | Admitting: Cardiology

## 2019-02-03 ENCOUNTER — Ambulatory Visit (INDEPENDENT_AMBULATORY_CARE_PROVIDER_SITE_OTHER): Payer: Medicare Other | Admitting: Cardiology

## 2019-02-03 VITALS — BP 132/65 | HR 62 | Ht 70.0 in | Wt 195.1 lb

## 2019-02-03 DIAGNOSIS — I25118 Atherosclerotic heart disease of native coronary artery with other forms of angina pectoris: Secondary | ICD-10-CM

## 2019-02-03 DIAGNOSIS — I6522 Occlusion and stenosis of left carotid artery: Secondary | ICD-10-CM | POA: Diagnosis not present

## 2019-02-03 DIAGNOSIS — I10 Essential (primary) hypertension: Secondary | ICD-10-CM | POA: Diagnosis not present

## 2019-02-03 DIAGNOSIS — I480 Paroxysmal atrial fibrillation: Secondary | ICD-10-CM

## 2019-02-03 DIAGNOSIS — E7800 Pure hypercholesterolemia, unspecified: Secondary | ICD-10-CM | POA: Diagnosis not present

## 2019-02-03 NOTE — Progress Notes (Signed)
Primary Physician/Referring:  Alroy Dust, L.Marlou Sa, MD  Patient ID: Cody Hodge, male    DOB: 17-Jun-1946, 73 y.o.   MRN: [id number]  Chief Complaint  Patient presents with  . Atrial Fibrillation  . Hypertension  . Coronary Artery Disease  . Follow-up    62mo   HPI: Cody Hodge is a 73y.o. male  with  paroxysmal atrial fibrillation, hyperlipidemia, CAD S/P coronary angiography on 03/17/2017 and underwent stenting to his very large PL branch of RCA and has since dual D1 and D2 stenosis recommended medical therapy due to anatomy and an occluded small circumflex  OM-1 coronary artery the now presents for 6 month follow-up.   Has retinal infarct left eye incidentally noted while undergoing anual eye exam in March 2019 [place]  He is presently doing well. He has not used any S/L NTG. He exercises on a regular basis in the form of playing tennis. He does have hemorrhoids and has noticed he is spotting occasionally and this is unchanged. No bowel changes.   Past Medical History:  Diagnosis Date  . Arthritis   . BPH (benign prostatic hyperplasia)   . Coronary artery disease 09/2014   50% mid to distal LAD, 50-70% ostial first diag, 40% ostial and mid left circ.  . ED (erectile dysfunction)   . Hemorrhoids    associated with bowel movements  . HTN (hypertension)   . Hypercholesteremia   . Knee arthropathy   . [place]   . [place]anoma ([place]   . Mixed hyperlipidemia   . OSA (obstructive sleep apnea)    mild to moderate refused CPAP  . PAF (paroxysmal atrial fibrillation) (HCC)    CHADS VASC score 2 (age>65 and HTN)  . Tremor     Past Surgical History:  Procedure Laterality Date  . CARDIAC CATHETERIZATION     2'16- only ever heart cath  . CATARACT EXTRACTION, BILATERAL    . CORONARY STENT INTERVENTION N/A 03/18/2017   Procedure: CORONARY STENT  INTERVENTION;  Surgeon: PNigel Mormon MD;  [facility];  Service: Cardiovascular;  Laterality: N/A;  . HYDROCELE EXCISION / REPAIR    . LASIK    . left and right arthroscopy    . left forearm melanoma    . LEFT HEART CATH AND CORONARY ANGIOGRAPHY N/A 03/18/2017   Procedure: Left Heart Cath and Coronary Angiography;  Surgeon: PNigel Mormon MD;  [facility];  Service: Cardiovascular;  Laterality: N/A;  . LEFT HEART CATHETERIZATION WITH CORONARY ANGIOGRAM N/A 10/06/2014   Procedure: LEFT HEART CATHETERIZATION WITH CORONARY ANGIOGRAM;  Surgeon: HSinclair Grooms MD;  [facility];  Service: Cardiovascular;  Laterality: N/A;  . TOTAL KNEE ARTHROPLASTY Bilateral 03/29/2015   Procedure: TOTAL KNEE BILATERAL;  Surgeon: FGaynelle Arabian MD;  [facility];  Service: Orthopedics;  Laterality: Bilateral;  spinal anes. placed in OR    Social History   Socioeconomic History  . Marital status: Married    Spouse name: Not on file  . Number of children: 3  . Years of education: Not on file  . Highest education level: Not on file  Occupational History  . Not on file  Social Needs  . Financial resource strain: Not on file  . Food insecurity    Worry: Not on file    Inability: Not  on file  . Transportation needs    Medical: Not on file    Non-medical: Not on file  Tobacco Use  . Smoking status: Never Smoker  . Smokeless tobacco: Never Used  Substance and Sexual Activity  . Alcohol use: Yes    Comment: 4 drinks per week  . Drug use: No  . Sexual activity: Not on file  Lifestyle  . Physical activity    Days per week: Not on file    Minutes per session: Not on file  . Stress: Not on file  Relationships  . Social Herbalist on phone: Not on file    Gets together: Not on file    Attends religious service: Not on file    Active member of club or organization: Not on file    Attends meetings of clubs or organizations: Not on  file    Relationship status: Not on file  . Intimate partner violence    Fear of current or ex partner: Not on file    Emotionally abused: Not on file    Physically abused: Not on file    Forced sexual activity: Not on file  Other Topics Concern  . Not on file  Social History Narrative  . Not on file    Review of Systems  Constitution: Negative for chills, decreased appetite, malaise/fatigue and weight gain.  Cardiovascular: Negative for dyspnea on exertion, leg swelling and syncope.  Endocrine: Negative for cold intolerance.  Hematologic/Lymphatic: Does not bruise/bleed easily.  Musculoskeletal: Positive for joint pain. Negative for joint swelling.  Gastrointestinal: Positive for hemorrhoids (chronic). Negative for abdominal pain, anorexia, change in bowel habit, hematochezia and melena.  Neurological: Negative for headaches and light-headedness.  Psychiatric/Behavioral: Negative for depression and substance abuse.  All other systems reviewed and are negative.   Objective  Blood pressure 132/65, pulse 62, height '5\' 10"'$  (1.778 m), weight 195 lb 1.6 oz (88.5 kg), SpO2 97 %. Body mass index is 27.99 kg/m.    Physical Exam  Constitutional: He appears well-developed and well-nourished. No distress.  HENT:  Head: Atraumatic.  Eyes: Conjunctivae are normal.  Neck: Neck supple. No JVD present. No thyromegaly present.  Cardiovascular: Normal rate, regular rhythm, normal heart sounds, intact distal pulses and normal pulses. Exam reveals no gallop.  No murmur heard. Pulses:      Carotid pulses are 2+ on the right side and 2+ on the left side with bruit. Right normal.  No edema bilateral legs  Pulmonary/Chest: Effort normal and breath sounds normal.  Abdominal: Soft. Bowel sounds are normal.  Musculoskeletal: Normal range of motion.  Neurological: He is alert.  Skin: Skin is warm and dry.  Psychiatric: He has a normal mood and affect.   Radiology: No results found.  Laboratory  examination:   02/20/2018: HB 13.8/HCT 41.4, platelets 262, normal indicis.  Serum glucose 91 mg, BUN 32, creatinine 1.08, eGFR greater than 60 ML, potassium 4.7, CMP otherwise normal.  Ferritin normal.  Total cholesterol 136, triglycerides 100, HDL 42, LDL 74.  CMP Latest Ref Rng & Units 12/06/2017 03/18/2017 04/01/2015  Glucose 65 - 99 mg/dL 103(H) 102(H) 126(H)  BUN 6 - 20 mg/dL 24(H) 34(H) 21(H)  Creatinine 0.61 - 1.24 mg/dL 1.03 1.18 0.93  Sodium 135 - 145 mmol/L 136 140 137  Potassium 3.5 - 5.1 mmol/L 3.8 4.3 3.9  Chloride 101 - 111 mmol/L 101 106 100(L)  CO2 22 - 32 mmol/L '27 25 31  '$ Calcium 8.9 - 10.3 mg/dL  9.0 9.2 8.6(L)  Total Protein 6.5 - 8.1 g/dL - - 5.4(L)  Total Bilirubin 0.3 - 1.2 mg/dL - - 0.5  Alkaline Phos 38 - 126 U/L - - 64  AST 15 - 41 U/L - - 29  ALT 17 - 63 U/L - - 22   CBC Latest Ref Rng & Units 12/06/2017 04/03/2015 04/01/2015  WBC 4.0 - 10.5 K/uL 10.5 10.7(H) 11.3(H)  Hemoglobin 13.0 - 17.0 g/dL 12.5(L) 8.8(L) 8.3(L)  Hematocrit 39.0 - 52.0 % 38.0(L) 26.3(L) 25.1(L)  Platelets 150 - 400 K/uL 346 328 234   Lipid Panel  No results found for: CHOL, TRIG, HDL, CHOLHDL, VLDL, LDLCALC, LDLDIRECT HEMOGLOBIN A1C No results found for: HGBA1C, MPG TSH No results for input(s): TSH in the last 8760 hours.   Medications   Medications Discontinued During This Encounter  Medication Reason  . clopidogrel (PLAVIX) 75 MG tablet Change in therapy  . HYDROcodone-acetaminophen (NORCO/VICODIN) 5-325 MG tablet No longer needed (for PRN medications)   Current Meds  Medication Sig  . apixaban (ELIQUIS) 5 MG TABS tablet Take 5 mg by mouth 2 (two) times daily.  Marland Kitchen atorvastatin (LIPITOR) 20 MG tablet Take 1 tablet (20 mg total) by mouth every morning.  . cholecalciferol (VITAMIN D) 1000 units tablet Take 1,000 Units by mouth daily.  . Coenzyme Q10 (CO Q-10) 200 MG CAPS Take 200 mg by mouth daily.  . ferrous sulfate 325 (65 FE) MG tablet Take 325 mg by mouth daily.  . finasteride  (PROSCAR) 5 MG tablet Take 5 mg by mouth daily.   . flecainide (TAMBOCOR) 100 MG tablet Take 50 mg by mouth 2 (two) times daily.   Marland Kitchen ibuprofen (ADVIL,MOTRIN) 200 MG tablet Take 400 mg by mouth 2 (two) times daily as needed for moderate pain.  . metoprolol succinate (TOPROL-XL) 25 MG 24 hr tablet Take 1 tablet (25 mg total) by mouth daily.  . Multiple Vitamin (MULTIVITAMIN WITH MINERALS) TABS tablet Take 1 tablet by mouth daily.  Marland Kitchen OVER THE COUNTER MEDICATION Take 2 capsules by mouth every morning. elysium  . Polyethyl Glycol-Propyl Glycol (SYSTANE OP) Place 1 drop into both eyes 2 (two) times daily.   . ramipril (ALTACE) 5 MG capsule Take 1 capsule (5 mg total) by mouth daily.  Marland Kitchen Resveratrol 250 MG CAPS Take 250 mg by mouth 2 (two) times daily.  . TURMERIC PO Take 1 capsule by mouth 2 (two) times daily.   . [DISCONTINUED] clopidogrel (PLAVIX) 75 MG tablet Take 1 tablet (75 mg total) by mouth daily.  . [DISCONTINUED] HYDROcodone-acetaminophen (NORCO/VICODIN) 5-325 MG tablet Take 1-2 tablets by mouth every 6 (six) hours as needed for severe pain.    Cardiac Studies:   Coronary angiogram 03/18/2017 : RCA Large PL-2 90% stenosis, stenting with SYNERGY 3.0 X 28 mm DES. Left main: Distal 20% stenosis. LAD: D-1 & D-2 80% proximal stenoses, not suitable for coronary intervention due to acute angle unless symptoms. Cx Small nondominant vessel with ostial 90% stenosis, followed by subtotal Mid OM-1.  Echocardiogram 01/23/2017: Left ventricle cavity is normal in size. Mild asymmetric hypertrophy of the left ventricle. Normal global wall motion. Visual EF is 50-55%. Normal diastolic filling pattern, normal LAP. Left atrial cavity is mildly dilated at 4.2 cm. Trace tricuspid regurgitation. Mild pulmonary hypertension. Pulmonary artery systolic pressure is estimated at 33 mm Hg. IVC is minimally dilated with blunted respiratory response. May suggest elevated central venous pressure. Compared to 07/70/2016,  mild pulmonary hypertension new.  Assessment   Coronary  artery disease of native artery of native heart with stable angina pectoris ([place]) - 03/18/2017 : RCA Large  PL-2 90% stenosis, S/P SYNERGY 3.0 X 28 mm DES. Cx  Small nondominant vessel with subtotalled OM-1. D-2 small 90% ostial. - Plan:   PAF (paroxysmal atrial fibrillation) (HCC) - CHA2DS2-VASc Score is 3 with yearly risk of stroke of 3.4%. - Plan: EKG 12-Lead,   Essential hypertension - Plan:   Hypercholesteremia - Plan:   Symptomatic stenosis of left carotid artery without infarction - Amarousis fugax left with left ICA 50% stenosis by CTA in Jan 2019 in [place]: PCV CAROTID DUPLEX (BILATERAL),    EKG 02/03/2019: Sinus bradycardia at rate of 57 bpm with borderline first-degree AV block, normal axis, no evidence of ischemia, otherwise normal EKG. No significant change from  EKG 08/03/2018  Recommendations:   Patient is here on a 9-monthoffice visit and follow-up, is presently doing well without recurrence of angina pectoris, does have mild arthritis.  No significant bleeding diathesis on anticoagulation for atrial fibrillation.  Remains in sinus rhythm today.  Blood pressure is also well controlled.  He has not had any lipids or blood work done in the past close to a year, I have ordered CBC, CMP and lipid profile testing.  He has no developed a prominent left carotid bruit, he also has history of left ICA stenosis by CTA in January 2020, will obtain carotid duplex.  I will see him back in 6 months.  JAdrian Prows MD, [facility]6/24/2020, 2:08 PM PL[facility]headCardiovascular. PSanta RosaPager: [phone] Office: [phone] no answer Cell [phone]

## 2019-02-18 ENCOUNTER — Other Ambulatory Visit: Payer: Self-pay | Admitting: Cardiology

## 2019-02-19 NOTE — Telephone Encounter (Signed)
Please fill

## 2019-03-11 ENCOUNTER — Telehealth: Payer: Self-pay

## 2019-03-11 NOTE — Telephone Encounter (Signed)
Mr. Cody Hodge called stating he was having some symptoms of dizziness, nausea, and sweating. He is currently out of town and called for advise. MA was unavailable. I did inform patient to go to ER if symptoms gotten worse. I gave him contact information to [facility], which is near where he is currently located out of town. Please call patient.

## 2019-03-12 NOTE — Telephone Encounter (Signed)
Patient would not make appointment and practically demanded for a call before Monday from someone in our office. Can you please call him?

## 2019-03-12 NOTE — Telephone Encounter (Signed)
Please follow up with the patient. Please check if JG/AK can see him in the coming few days.  Thanks MJP

## 2019-03-12 NOTE — Telephone Encounter (Signed)
Spoke with the patient. He had an episode on 7/31 of sudden onset of severe dizziness, nausea, staggering gait, lasting for 45 minutes. No chest pain, shortness of breath. I suspect this is vertigo. No recurrence of symptoms since then. Will follow up with PCP.   Thanks MJP

## 2019-03-16 DIAGNOSIS — I10 Essential (primary) hypertension: Secondary | ICD-10-CM | POA: Diagnosis not present

## 2019-03-16 DIAGNOSIS — E7800 Pure hypercholesterolemia, unspecified: Secondary | ICD-10-CM | POA: Diagnosis not present

## 2019-03-16 DIAGNOSIS — I480 Paroxysmal atrial fibrillation: Secondary | ICD-10-CM | POA: Diagnosis not present

## 2019-03-17 DIAGNOSIS — H34232 Retinal artery branch occlusion, left eye: Secondary | ICD-10-CM | POA: Diagnosis not present

## 2019-03-17 DIAGNOSIS — Z9889 Other specified postprocedural states: Secondary | ICD-10-CM | POA: Diagnosis not present

## 2019-03-17 DIAGNOSIS — H43392 Other vitreous opacities, left eye: Secondary | ICD-10-CM | POA: Diagnosis not present

## 2019-03-17 DIAGNOSIS — Z961 Presence of intraocular lens: Secondary | ICD-10-CM | POA: Diagnosis not present

## 2019-03-17 DIAGNOSIS — H26492 Other secondary cataract, left eye: Secondary | ICD-10-CM | POA: Diagnosis not present

## 2019-03-17 LAB — CMP14+EGFR
ALT: 63 IU/L — ABNORMAL HIGH (ref 0–44)
AST: 45 IU/L — ABNORMAL HIGH (ref 0–40)
Albumin/Globulin Ratio: 2.1 (ref 1.2–2.2)
Albumin: 4.2 g/dL (ref 3.7–4.7)
Alkaline Phosphatase: 118 IU/L — ABNORMAL HIGH (ref 39–117)
BUN/Creatinine Ratio: 23 (ref 10–24)
BUN: 26 mg/dL (ref 8–27)
Bilirubin Total: 0.5 mg/dL (ref 0.0–1.2)
CO2: 23 mmol/L (ref 20–29)
Calcium: 9.4 mg/dL (ref 8.6–10.2)
Chloride: 102 mmol/L (ref 96–106)
Creatinine, Ser: 1.14 mg/dL (ref 0.76–1.27)
GFR calc Af Amer: 73 mL/min/{1.73_m2} (ref >59)
GFR calc non Af Amer: 63 mL/min/{1.73_m2} (ref >59)
Globulin, Total: 2 g/dL (ref 1.5–4.5)
Glucose: 90 mg/dL (ref 65–99)
Potassium: 5 mmol/L (ref 3.5–5.2)
Sodium: 141 mmol/L (ref 134–144)
Total Protein: 6.2 g/dL (ref 6.0–8.5)

## 2019-03-17 LAB — CBC
Hematocrit: 42 % (ref 37.5–51.0)
Hemoglobin: 14.1 g/dL (ref 13.0–17.7)
MCH: 29 pg (ref 26.6–33.0)
MCHC: 33.6 g/dL (ref 31.5–35.7)
MCV: 86 fL (ref 79–97)
Platelets: 296 10*3/uL (ref 150–450)
RBC: 4.87 x10E6/uL (ref 4.14–5.80)
RDW: 13.2 % (ref 11.6–15.4)
WBC: 7.8 10*3/uL (ref 3.4–10.8)

## 2019-03-17 LAB — LIPID PANEL WITH LDL/HDL RATIO
Cholesterol, Total: 130 mg/dL (ref 100–199)
HDL: 41 mg/dL (ref >39)
LDL Calculated: 74 mg/dL (ref 0–99)
LDl/HDL Ratio: 1.8 ratio (ref 0.0–3.6)
Triglycerides: 73 mg/dL (ref 0–149)
VLDL Cholesterol Cal: 15 mg/dL (ref 5–40)

## 2019-03-22 ENCOUNTER — Other Ambulatory Visit: Payer: Self-pay

## 2019-03-22 ENCOUNTER — Ambulatory Visit (INDEPENDENT_AMBULATORY_CARE_PROVIDER_SITE_OTHER): Payer: Medicare Other

## 2019-03-22 DIAGNOSIS — I6522 Occlusion and stenosis of left carotid artery: Secondary | ICD-10-CM | POA: Diagnosis not present

## 2019-03-22 DIAGNOSIS — R972 Elevated prostate specific antigen [PSA]: Secondary | ICD-10-CM | POA: Diagnosis not present

## 2019-03-28 ENCOUNTER — Other Ambulatory Visit: Payer: Self-pay | Admitting: Cardiology

## 2019-03-28 DIAGNOSIS — I6522 Occlusion and stenosis of left carotid artery: Secondary | ICD-10-CM

## 2019-03-29 DIAGNOSIS — R972 Elevated prostate specific antigen [PSA]: Secondary | ICD-10-CM | POA: Diagnosis not present

## 2019-03-29 DIAGNOSIS — N401 Enlarged prostate with lower urinary tract symptoms: Secondary | ICD-10-CM | POA: Diagnosis not present

## 2019-03-29 DIAGNOSIS — R351 Nocturia: Secondary | ICD-10-CM | POA: Diagnosis not present

## 2019-04-15 DIAGNOSIS — Z85828 Personal history of other malignant neoplasm of skin: Secondary | ICD-10-CM | POA: Diagnosis not present

## 2019-04-15 DIAGNOSIS — Z0000 Encounter for general adult medical examination without abnormal findings: Secondary | ICD-10-CM | POA: Diagnosis not present

## 2019-04-15 DIAGNOSIS — E7800 Pure hypercholesterolemia, unspecified: Secondary | ICD-10-CM | POA: Diagnosis not present

## 2019-05-09 IMAGING — CR DG HUMERUS 2V *R*
4 series · 4 of 4 positions shown · non-contrast
Comparison: Same day shoulder radiographs.

CLINICAL DATA: Patient fell on right shoulder while playing tennis.
Deformity.

EXAM:
RIGHT HUMERUS - 2+ VIEW

[x humerus lat right (1 of 2)]
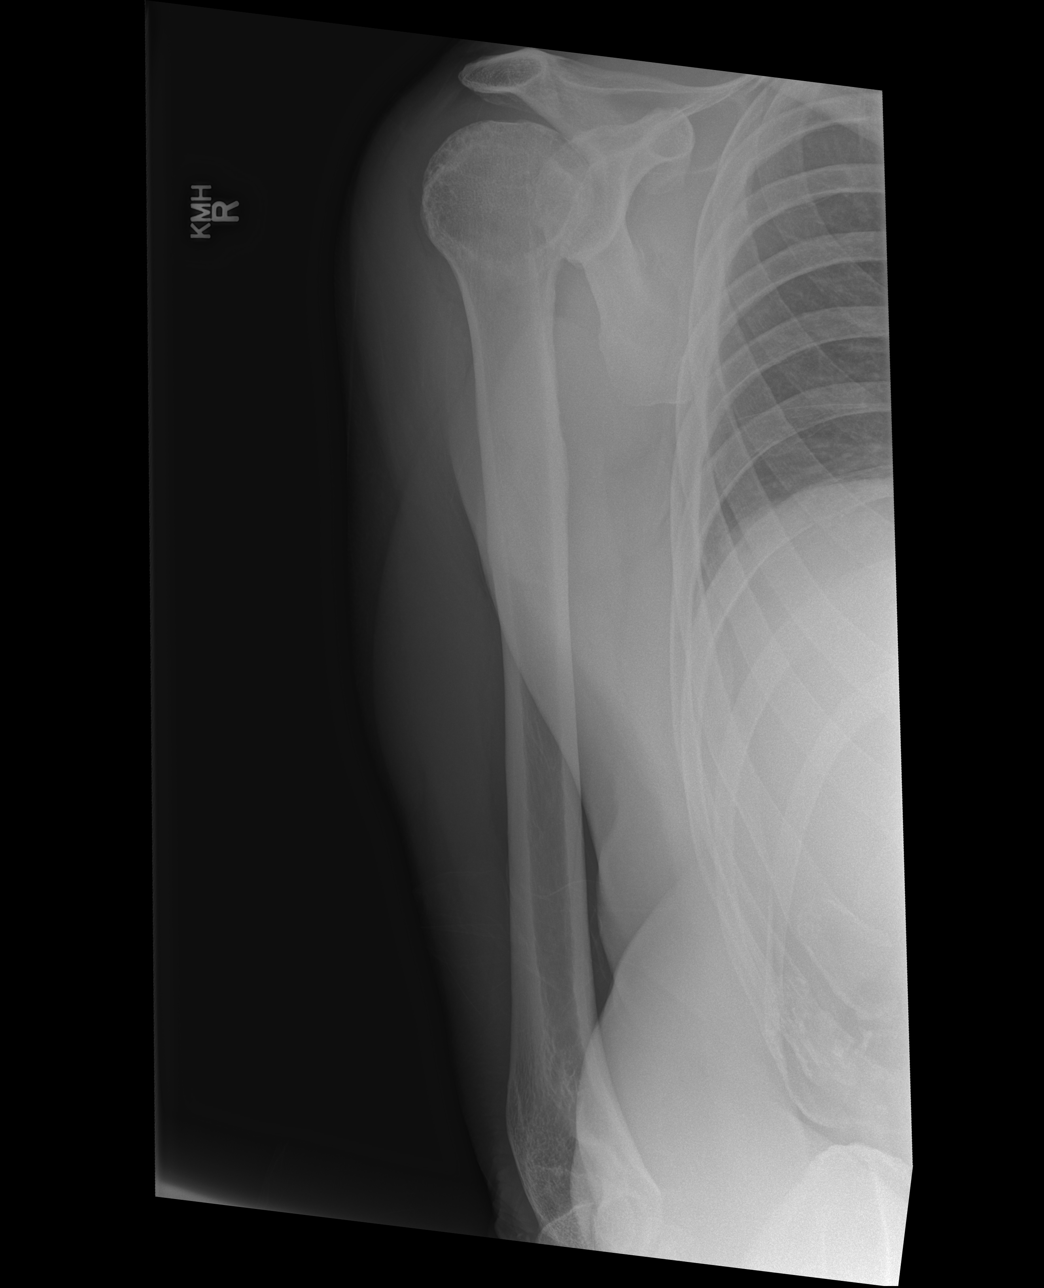

[x humerus lat right (2 of 2)]
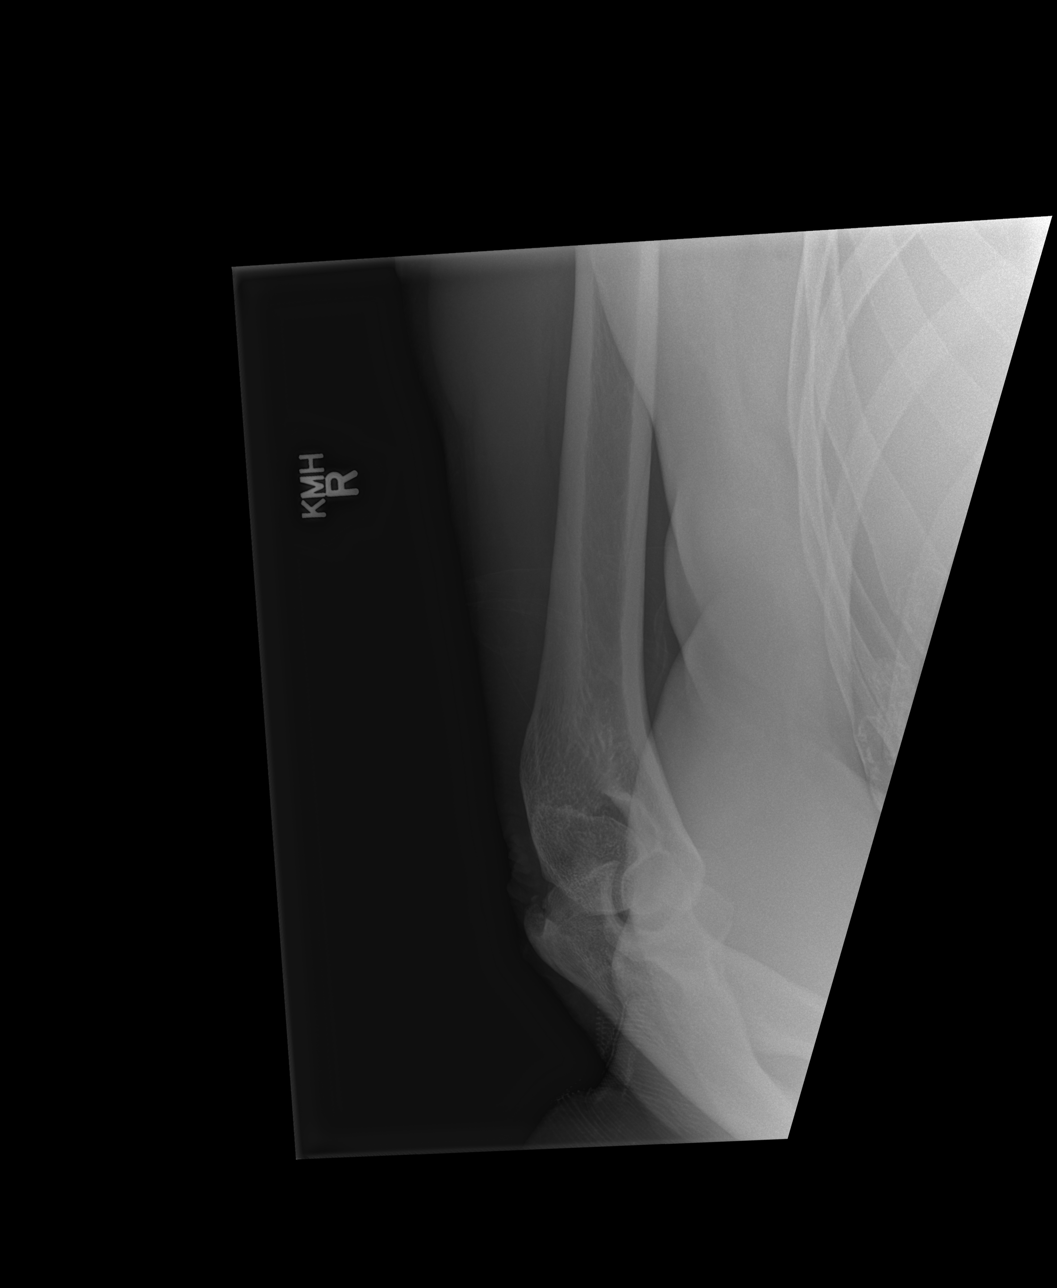

[x humerus ap right (1 of 2)]
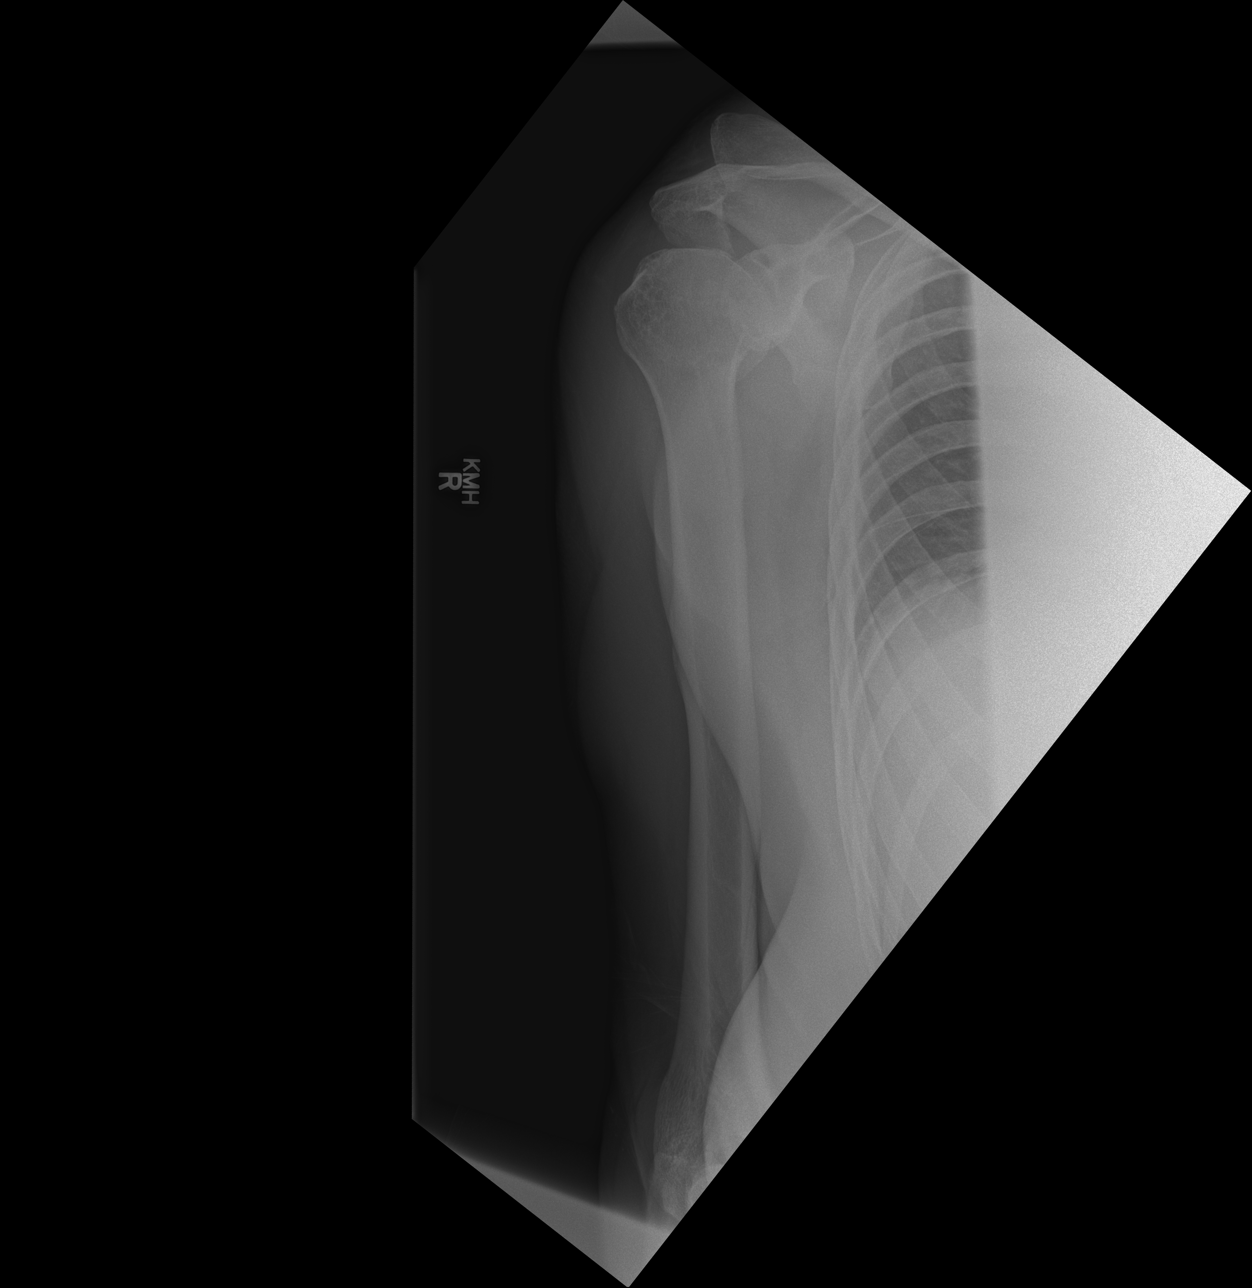

[x humerus ap right (2 of 2)]
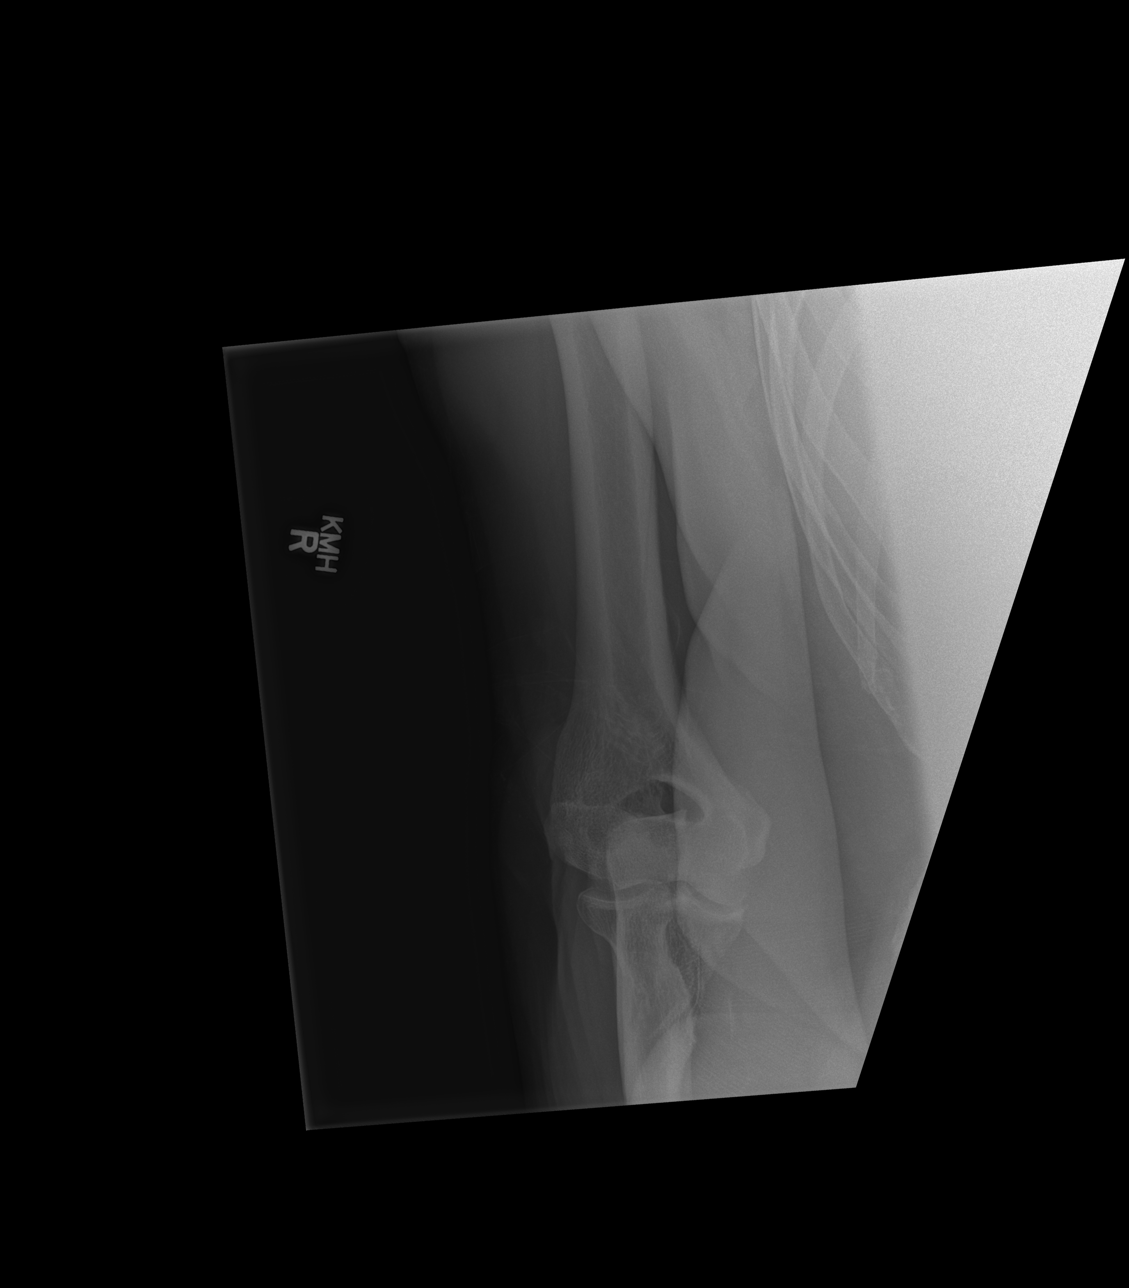

[4 of 4 positions shown; findings below may reference images not displayed]

FINDINGS: There is no evidence of acute fracture of the right humerus.
Osteoarthritis of the right glenohumeral joint. Intact elbow joint.
AC joint separation is again noted. Soft tissues are unremarkable.
IMPRESSION: 1. AC joint separation again demonstrated.
2. Osteoarthritis of the right glenohumeral joint.
3. No acute fracture of the humerus.

## 2019-05-13 ENCOUNTER — Other Ambulatory Visit: Payer: Self-pay | Admitting: Cardiology

## 2019-06-01 DIAGNOSIS — Z23 Encounter for immunization: Secondary | ICD-10-CM | POA: Diagnosis not present

## 2019-06-02 ENCOUNTER — Other Ambulatory Visit: Payer: Self-pay

## 2019-06-02 MED ORDER — FLECAINIDE ACETATE 100 MG PO TABS: 50.0000 mg | ORAL_TABLET | Freq: Two times a day (BID) | ORAL | 0 refills | Status: DC

## 2019-06-02 NOTE — Telephone Encounter (Signed)
Can this be filled ?

## 2019-07-30 ENCOUNTER — Other Ambulatory Visit: Payer: Self-pay

## 2019-07-30 MED ORDER — APIXABAN 5 MG PO TABS: 5.0000 mg | ORAL_TABLET | Freq: Two times a day (BID) | ORAL | 0 refills | Status: DC

## 2019-08-04 ENCOUNTER — Other Ambulatory Visit: Payer: Self-pay | Admitting: Cardiology

## 2019-08-04 ENCOUNTER — Ambulatory Visit: Payer: Medicare Other | Admitting: Cardiology

## 2019-11-10 ENCOUNTER — Other Ambulatory Visit: Payer: Self-pay | Admitting: Cardiology

## 2019-11-19 ENCOUNTER — Other Ambulatory Visit: Payer: Self-pay | Admitting: Cardiology

## 2019-11-19 NOTE — Telephone Encounter (Signed)
Refill request

## 2019-11-23 ENCOUNTER — Other Ambulatory Visit: Payer: Self-pay

## 2019-11-23 MED ORDER — RAMIPRIL 5 MG PO CAPS: 5.0000 mg | ORAL_CAPSULE | Freq: Every day | ORAL | 0 refills | Status: DC

## 2019-12-20 ENCOUNTER — Other Ambulatory Visit: Payer: Self-pay | Admitting: Cardiology

## 2019-12-22 ENCOUNTER — Other Ambulatory Visit: Payer: Self-pay | Admitting: Cardiology

## 2019-12-22 DIAGNOSIS — I10 Essential (primary) hypertension: Secondary | ICD-10-CM

## 2019-12-22 DIAGNOSIS — R739 Hyperglycemia, unspecified: Secondary | ICD-10-CM

## 2019-12-22 DIAGNOSIS — E7800 Pure hypercholesterolemia, unspecified: Secondary | ICD-10-CM

## 2019-12-23 ENCOUNTER — Other Ambulatory Visit: Payer: Self-pay | Admitting: Cardiology

## 2019-12-23 DIAGNOSIS — E782 Mixed hyperlipidemia: Secondary | ICD-10-CM

## 2019-12-24 LAB — HGB A1C W/O EAG: Hgb A1c MFr Bld: 5.3 % (ref 4.8–5.6)

## 2019-12-24 LAB — CMP14+EGFR
ALT: 54 IU/L — ABNORMAL HIGH (ref 0–44)
AST: 41 IU/L — ABNORMAL HIGH (ref 0–40)
Albumin/Globulin Ratio: 1.9 (ref 1.2–2.2)
Albumin: 4.3 g/dL (ref 3.7–4.7)
Alkaline Phosphatase: 128 IU/L — ABNORMAL HIGH (ref 39–117)
BUN/Creatinine Ratio: 22 (ref 10–24)
BUN: 26 mg/dL (ref 8–27)
Bilirubin Total: 0.4 mg/dL (ref 0.0–1.2)
CO2: 26 mmol/L (ref 20–29)
Calcium: 9.4 mg/dL (ref 8.6–10.2)
Chloride: 103 mmol/L (ref 96–106)
Creatinine, Ser: 1.17 mg/dL (ref 0.76–1.27)
GFR calc Af Amer: 71 mL/min/{1.73_m2} (ref >59)
GFR calc non Af Amer: 61 mL/min/{1.73_m2} (ref >59)
Globulin, Total: 2.3 g/dL (ref 1.5–4.5)
Glucose: 88 mg/dL (ref 65–99)
Potassium: 5.2 mmol/L (ref 3.5–5.2)
Sodium: 139 mmol/L (ref 134–144)
Total Protein: 6.6 g/dL (ref 6.0–8.5)

## 2019-12-24 LAB — CBC
Hematocrit: 41 % (ref 37.5–51.0)
Hemoglobin: 12.9 g/dL — ABNORMAL LOW (ref 13.0–17.7)
MCH: 27.7 pg (ref 26.6–33.0)
MCHC: 31.5 g/dL (ref 31.5–35.7)
MCV: 88 fL (ref 79–97)
Platelets: 329 10*3/uL (ref 150–450)
RBC: 4.65 x10E6/uL (ref 4.14–5.80)
RDW: 13 % (ref 11.6–15.4)
WBC: 8.6 10*3/uL (ref 3.4–10.8)

## 2019-12-24 LAB — LIPID PANEL WITH LDL/HDL RATIO
Cholesterol, Total: 132 mg/dL (ref 100–199)
HDL: 48 mg/dL (ref >39)
LDL Chol Calc (NIH): 71 mg/dL (ref 0–99)
LDL/HDL Ratio: 1.5 ratio (ref 0.0–3.6)
Triglycerides: 62 mg/dL (ref 0–149)
VLDL Cholesterol Cal: 13 mg/dL (ref 5–40)

## 2019-12-24 LAB — TSH: TSH: 1.65 u[IU]/mL (ref 0.450–4.500)

## 2019-12-25 NOTE — Progress Notes (Signed)
Primary Physician/Referring:  Alroy Dust, L.Marlou Sa, MD  Patient ID: Cody Hodge, male    DOB: November 14, 1945, 74 y.o.   MRN: [id number]  Chief Complaint  Patient presents with  . Coronary Artery Disease  . Carotid Stenosis  . Follow-up    6 month   HPI:    Cody Hodge  is a 74 y.o.  male  with  paroxysmal atrial fibrillation, hyperlipidemia, CAD S/P coronary angiography on 03/17/2017 and underwent stenting to his very large PL branch of RCA and has since dual D1 and D2 stenosis recommended medical therapy due to anatomy and an occluded small circumflex OM-1, now presents for 6 month follow-up.   Has retinal infarct left eye incidentally noted while undergoing anual eye exam in March 2019 while in [place] and has moderate left carotid stenosis. He lives in [place] during winters and here in [place] during most other months.  He is presently doing well. He has not used any S/L NTG. He exercises on a regular basis in the form of playing tennis. He does have hemorrhoids and has noticed he is spotting occasionally and this is unchanged. No bowel changes. He has been taking Ibuprofen 400 mg once daily prior to playing tennis due to arthritis.    Past Medical History:  Diagnosis Date  . Arthritis   . BPH (benign prostatic hyperplasia)   . Coronary artery disease 09/2014   50% mid to distal LAD, 50-70% ostial first diag, 40% ostial and mid left circ.  . ED (erectile dysfunction)   . Hemorrhoids    associated with bowel movements  . HTN (hypertension)   . Hypercholesteremia   . Knee arthropathy   . Melanoma ([place])   . Melanoma ([place])   . Mixed hyperlipidemia   . OSA (obstructive sleep apnea)    mild to moderate refused CPAP  . PAF (paroxysmal atrial fibrillation) (HCC)    CHADS VASC score 2 (age>65 and HTN)  . Tremor    Past Surgical History:  Procedure Laterality Date  . CARDIAC CATHETERIZATION     2'16- only ever heart cath  . CATARACT EXTRACTION, BILATERAL    . CORONARY STENT INTERVENTION  N/A 03/18/2017   Procedure: CORONARY STENT INTERVENTION;  Surgeon: Nigel Mormon, MD;  Location: [facility];  Service: Cardiovascular;  Laterality: N/A;  . HYDROCELE EXCISION / REPAIR    . LASIK    . left and right arthroscopy    . left forearm melanoma    . LEFT HEART CATH AND CORONARY ANGIOGRAPHY N/A 03/18/2017   Procedure: Left Heart Cath and Coronary Angiography;  Surgeon: Nigel Mormon, MD;  Location: [facility];  Service: Cardiovascular;  Laterality: N/A;  . LEFT HEART CATHETERIZATION WITH CORONARY ANGIOGRAM N/A 10/06/2014   Procedure: LEFT HEART CATHETERIZATION WITH CORONARY ANGIOGRAM;  Surgeon: Sinclair Grooms, MD;  Location: [facility] CATH [facility];  Service: Cardiovascular;  Laterality: N/A;  . TOTAL KNEE ARTHROPLASTY Bilateral 03/29/2015   Procedure: TOTAL KNEE BILATERAL;  Surgeon: Gaynelle Arabian, MD;  Location: [facility];  Service: Orthopedics;  Laterality: Bilateral;  spinal anes. placed in OR   Family History  Problem Relation Age of Onset  . Dementia Mother   . Heart attack Father   . Heart disease Father     Social History   Tobacco Use  . Smoking status: Never Smoker  . Smokeless tobacco: Never Used  Substance Use Topics  . Alcohol use: Yes    Comment: 4 drinks per week  Marital Status: Married  ROS  Review of Systems  Cardiovascular: Negative for dyspnea on exertion, leg swelling and syncope.  Respiratory: Negative for shortness of breath.   Musculoskeletal: Positive for joint pain. Negative for joint swelling.  Gastrointestinal: Positive for hemorrhoids (chronic). Negative for melena.   Objective  Blood pressure 113/65, pulse 67, temperature 98.4 F (36.9 C), temperature source Temporal, resp. rate 16, height _0  (1.778 m), weight 193 lb 12.8 oz (87.9 kg), SpO2 96 %.  Vitals with BMI 12/27/2019 02/03/2019 12/07/2017  Height _1  _2  -  Weight 193 lbs 13 oz 195 lbs 2 oz -  BMI 02.58 52.77 -  Systolic 824 235 361  Diastolic 65 65 75    Pulse 67 62 72     Physical Exam  Constitutional: He appears well-developed and well-nourished. No distress.  Neck: No thyromegaly present.  Cardiovascular: Normal rate, regular rhythm and intact distal pulses. Exam reveals no gallop.  No murmur heard. Pulses:      Carotid pulses are 2+ on the right side and 2+ on the left side with bruit. No leg edema, no JVD.   Pulmonary/Chest: Breath sounds normal. No accessory muscle usage.  Abdominal: Soft. Bowel sounds are normal.   Laboratory examination:   Recent Labs    03/16/19 1113 12/23/19 0915  NA 141 139  K 5.0 5.2  CL 102 103  CO2 23 26  GLUCOSE 90 88  BUN 26 26  CREATININE 1.14 1.17  CALCIUM 9.4 9.4  GFRNONAA 63 61  GFRAA 73 71   estimated creatinine clearance is 61.9 mL/min (by C-G formula based on SCr of 1.17 mg/dL).  CMP Latest Ref Rng & Units 12/23/2019 03/16/2019 12/06/2017  Glucose 65 - 99 mg/dL 88 90 103(H)  BUN 8 - 27 mg/dL 26 26 24(H)  Creatinine 0.76 - 1.27 mg/dL 1.17 1.14 1.03  Sodium 134 - 144 mmol/L 139 141 136  Potassium 3.5 - 5.2 mmol/L 5.2 5.0 3.8  Chloride 96 - 106 mmol/L 103 102 101  CO2 20 - 29 mmol/L _3 Calcium 8.6 - 10.2 mg/dL 9.4 9.4 9.0  Total Protein 6.0 - 8.5 g/dL 6.6 6.2 -  Total Bilirubin 0.0 - 1.2 mg/dL 0.4 0.5 -  Alkaline Phos 39 - 117 IU/L 128(H) 118(H) -  AST 0 - 40 IU/L 41(H) 45(H) -  ALT 0 - 44 IU/L 54(H) 63(H) -   CBC Latest Ref Rng & Units 12/23/2019 03/16/2019 12/06/2017  WBC 3.4 - 10.8 x10E3/uL 8.6 7.8 10.5  Hemoglobin 13.0 - 17.7 g/dL 12.9(L) 14.1 12.5(L)  Hematocrit 37.5 - 51.0 % 41.0 42.0 38.0(L)  Platelets 150 - 450 x10E3/uL 329 296 346   Lipid Panel     Component Value Date/Time   CHOL 132 12/23/2019 0915   TRIG 62 12/23/2019 0915   HDL 48 12/23/2019 0915   LDLCALC 71 12/23/2019 0915   HEMOGLOBIN A1C Lab Results  Component Value Date   HGBA1C 5.3 12/23/2019   TSH Recent Labs    12/23/19 0915  TSH 1.650   External labs:   02/20/2018: HB 13.8/HCT 41.4,  platelets 262, normal indicis.  Serum glucose 91 mg, BUN 32, creatinine 1.08, eGFR greater than 60 ML, potassium 4.7, CMP otherwise normal.  Ferritin normal.  Total cholesterol 136, triglycerides 100, HDL 42, LDL 74.  Medications and allergies  No Known Allergies   Current Outpatient Medications  Medication Instructions  . apixaban (ELIQUIS) 5 mg, Oral, 2 times daily, schedule appointment for refills  .  atorvastatin (LIPITOR) 20 MG tablet TAKE 1 TABLET BY MOUTH EVERY DAY IN THE MORNING  . cholecalciferol (VITAMIN D) 1,000 Units, Oral, Daily  . Co Q-10 200 mg, Oral, Daily  . ferrous sulfate 325 mg, Oral, Daily  . finasteride (PROSCAR) 5 mg, Daily  . flecainide (TAMBOCOR) 50 mg, Oral, 2 times daily  . ibuprofen (ADVIL) 400 mg, Oral, 2 times daily PRN  . metoprolol succinate (TOPROL-XL) 25 mg, Oral, Daily, Please schedule appointment for refills  . Multiple Vitamin (MULTIVITAMIN WITH MINERALS) TABS tablet 1 tablet, Oral, Daily  . OVER THE COUNTER MEDICATION 2 capsules, Oral, BH-each morning, elysium  . Polyethyl Glycol-Propyl Glycol (SYSTANE OP) 1 drop, Both Eyes, 2 times daily  . ramipril (ALTACE) 5 MG capsule TAKE 1 CAPSULE BY MOUTH EVERY DAY  . Resveratrol 250 mg, Oral, 2 times daily  . TURMERIC PO 1 capsule, Oral, 2 times daily   Radiology:   No results found.  Cardiac Studies:   Coronary angiogram 03/18/2017 : RCA Large PL-2 90% stenosis, stenting with SYNERGY 3.0 X 28 mm DES. Left main: Distal 20% stenosis. LAD: D-1 & D-2 80% proximal stenoses, not suitable for coronary intervention due to acute angle unless symptoms. Cx Small nondominant vessel with ostial 90% stenosis, followed by subtotal Mid OM-1.  Echocardiogram 01/23/2017: Left ventricle cavity is normal in size. Mild asymmetric hypertrophy of the left ventricle. Normal global wall motion. Visual EF is 50-55%. Normal diastolic filling pattern, normal LAP. Left atrial cavity is mildly dilated at 4.2 cm. Trace tricuspid  regurgitation. Mild pulmonary hypertension. Pulmonary artery systolic pressure is estimated at 33 mm Hg. IVC is minimally dilated with blunted respiratory response. May suggest elevated central venous pressure. Compared to 07/70/2016, mild pulmonary hypertension new.  Vascular Ultrasound Lower Extremity Venous 11/20/2017: Right: Probable acute DVT in the proximal and mid peroneal veins.  Pulsatile venous flow noted in the CFV. SFJ, DFV, and popliteal vein.  Interstitial fluid noted posterior knee.  Left: No evidence of common femoral vein obstruction.  Carotid artery duplex  03/22/2019:  Mild heterogeneous plaque in bilateral carotid arteries.  Stenosis in the left internal carotid artery (50-69%). Left CCA stenosis of <50%.  No prior studies. Compared to CTA report, left ICA stenosis of 50%.  Follow-up in six months is appropriate if clinically indicated.  EKG  EKG 12/27/2019: Normal sinus rhythm at rate of 65 bpm, left atrial enlargement, normal axis.  No evidence of ischemia, normal EKG.  Normal QT interval.  No significant change from 02/03/2019.   First-degree AV block not present.  Assessment     ICD-10-CM   1. Coronary artery disease of native artery of native heart with stable angina pectoris ([place])  I25.118 EKG 12-Lead  2. PAF (paroxysmal atrial fibrillation) ([place]). CHA2DS2-VASc Score is 5. Yearly risk of stroke: 6.7% (A, CAD, HTN, TIA-retinal infarct).    I48.0   3. Essential hypertension  I10   4. Hypercholesteremia  E78.00   5. Asymptomatic stenosis of left carotid artery  I65.22 PCV CAROTID DUPLEX (BILATERAL)   Amarousis fugax left with left ICA 50% stenosis by CTA in Jan 2019 in [place]: PCV CAROTID DUPLEX (BILATERAL),        No orders of the defined types were placed in this encounter.   There are no discontinued medications.  Recommendations:   Jackson Coffield  is a 74 y.o.  male  with  paroxysmal atrial fibrillation, hyperlipidemia, CAD S/P coronary angiography on  03/17/2017 and underwent stenting to his very large  PL branch of RCA and has since dual D1 and D2 stenosis recommended medical therapy due to anatomy and an occluded small circumflex  OM-1 coronary artery the now presents for 6 month follow-up.  Has retinal infarct left eye incidentally noted while undergoing anual eye exam in March 2019 while in [place] and has moderate left ICA stenosis.  Patient is here on a 20-monthoffice visit and follow-up, is presently doing well without recurrence of angina pectoris, does have mild arthritis and has been taking ibuprofen prior to playing tennis, he has no mildly anemic and I suspect he may have exacerbation of his hemorrhoids.  I have forwarded copies of his labs to his PCP.  Could consider use of Ultram instead of ibuprofen.  He is presently on flecainide and has known coronary disease.  He has been stable on this for many years without any symptoms and arrhythmias and he plays regular heavy tennis without any problems.  I will again discuss with EP to see whether I should discontinue this.  Lipids are well controlled, he will need repeat carotid artery duplex and I will see him back in 6 months.   JAdrian Prows MD, FAscension Via Christi Hospital [facility]5/17/2021, 8:30 PM [email or web address]. [facility] Pager: [phone] Office: [phone]

## 2019-12-26 ENCOUNTER — Other Ambulatory Visit: Payer: Self-pay | Admitting: Cardiology

## 2019-12-26 DIAGNOSIS — E782 Mixed hyperlipidemia: Secondary | ICD-10-CM

## 2019-12-27 ENCOUNTER — Ambulatory Visit: Payer: Medicare Other | Admitting: Cardiology

## 2019-12-27 ENCOUNTER — Other Ambulatory Visit: Payer: Self-pay

## 2019-12-27 ENCOUNTER — Encounter: Payer: Self-pay | Admitting: Cardiology

## 2019-12-27 VITALS — BP 113/65 | HR 67 | Temp 98.4°F | Resp 16 | Ht 70.0 in | Wt 193.8 lb

## 2019-12-27 DIAGNOSIS — B070 Plantar wart: Secondary | ICD-10-CM | POA: Diagnosis not present

## 2019-12-27 DIAGNOSIS — L918 Other hypertrophic disorders of the skin: Secondary | ICD-10-CM | POA: Diagnosis not present

## 2019-12-27 DIAGNOSIS — L570 Actinic keratosis: Secondary | ICD-10-CM | POA: Diagnosis not present

## 2019-12-27 DIAGNOSIS — I480 Paroxysmal atrial fibrillation: Secondary | ICD-10-CM | POA: Diagnosis not present

## 2019-12-27 DIAGNOSIS — L821 Other seborrheic keratosis: Secondary | ICD-10-CM | POA: Diagnosis not present

## 2019-12-27 DIAGNOSIS — E7800 Pure hypercholesterolemia, unspecified: Secondary | ICD-10-CM | POA: Diagnosis not present

## 2019-12-27 DIAGNOSIS — D1801 Hemangioma of skin and subcutaneous tissue: Secondary | ICD-10-CM | POA: Diagnosis not present

## 2019-12-27 DIAGNOSIS — I25118 Atherosclerotic heart disease of native coronary artery with other forms of angina pectoris: Secondary | ICD-10-CM

## 2019-12-27 DIAGNOSIS — I10 Essential (primary) hypertension: Secondary | ICD-10-CM

## 2019-12-27 DIAGNOSIS — L820 Inflamed seborrheic keratosis: Secondary | ICD-10-CM | POA: Diagnosis not present

## 2019-12-27 DIAGNOSIS — I6522 Occlusion and stenosis of left carotid artery: Secondary | ICD-10-CM

## 2019-12-27 DIAGNOSIS — L300 Nummular dermatitis: Secondary | ICD-10-CM | POA: Diagnosis not present

## 2019-12-27 DIAGNOSIS — Z85820 Personal history of malignant melanoma of skin: Secondary | ICD-10-CM | POA: Diagnosis not present

## 2019-12-27 DIAGNOSIS — C4442 Squamous cell carcinoma of skin of scalp and neck: Secondary | ICD-10-CM | POA: Diagnosis not present

## 2019-12-27 DIAGNOSIS — D485 Neoplasm of uncertain behavior of skin: Secondary | ICD-10-CM | POA: Diagnosis not present

## 2020-01-21 ENCOUNTER — Other Ambulatory Visit: Payer: Self-pay | Admitting: Cardiology

## 2020-02-03 ENCOUNTER — Other Ambulatory Visit: Payer: Self-pay | Admitting: Cardiology

## 2020-02-04 ENCOUNTER — Other Ambulatory Visit: Payer: Self-pay | Admitting: Cardiology

## 2020-02-04 DIAGNOSIS — E782 Mixed hyperlipidemia: Secondary | ICD-10-CM

## 2020-03-03 NOTE — Telephone Encounter (Signed)
From patient.

## 2020-03-16 DIAGNOSIS — H34232 Retinal artery branch occlusion, left eye: Secondary | ICD-10-CM | POA: Diagnosis not present

## 2020-03-16 DIAGNOSIS — H26493 Other secondary cataract, bilateral: Secondary | ICD-10-CM | POA: Diagnosis not present

## 2020-03-16 DIAGNOSIS — H43812 Vitreous degeneration, left eye: Secondary | ICD-10-CM | POA: Diagnosis not present

## 2020-03-16 DIAGNOSIS — Z9889 Other specified postprocedural states: Secondary | ICD-10-CM | POA: Diagnosis not present

## 2020-03-16 DIAGNOSIS — Z961 Presence of intraocular lens: Secondary | ICD-10-CM | POA: Diagnosis not present

## 2020-04-06 ENCOUNTER — Other Ambulatory Visit: Payer: Self-pay | Admitting: Cardiology

## 2020-04-07 ENCOUNTER — Telehealth: Payer: Self-pay

## 2020-04-07 NOTE — Telephone Encounter (Signed)
error 

## 2020-04-07 NOTE — Telephone Encounter (Signed)
Sent MyChart msg and left vm to cb.

## 2020-04-07 NOTE — Telephone Encounter (Signed)
Done

## 2020-04-10 DIAGNOSIS — R972 Elevated prostate specific antigen [PSA]: Secondary | ICD-10-CM | POA: Diagnosis not present

## 2020-04-10 DIAGNOSIS — N401 Enlarged prostate with lower urinary tract symptoms: Secondary | ICD-10-CM | POA: Diagnosis not present

## 2020-04-10 DIAGNOSIS — R351 Nocturia: Secondary | ICD-10-CM | POA: Diagnosis not present

## 2020-04-12 DIAGNOSIS — Z03818 Encounter for observation for suspected exposure to other biological agents ruled out: Secondary | ICD-10-CM | POA: Diagnosis not present

## 2020-04-18 DIAGNOSIS — B079 Viral wart, unspecified: Secondary | ICD-10-CM | POA: Diagnosis not present

## 2020-04-18 DIAGNOSIS — Z0000 Encounter for general adult medical examination without abnormal findings: Secondary | ICD-10-CM | POA: Diagnosis not present

## 2020-04-18 DIAGNOSIS — Z23 Encounter for immunization: Secondary | ICD-10-CM | POA: Diagnosis not present

## 2020-05-01 ENCOUNTER — Other Ambulatory Visit: Payer: Self-pay | Admitting: Cardiology

## 2020-05-01 ENCOUNTER — Ambulatory Visit: Payer: Medicare Other

## 2020-05-01 ENCOUNTER — Other Ambulatory Visit: Payer: Self-pay

## 2020-05-01 DIAGNOSIS — I6523 Occlusion and stenosis of bilateral carotid arteries: Secondary | ICD-10-CM

## 2020-05-01 DIAGNOSIS — I6522 Occlusion and stenosis of left carotid artery: Secondary | ICD-10-CM

## 2020-05-11 ENCOUNTER — Ambulatory Visit: Payer: Medicare Other | Admitting: Cardiology

## 2020-05-11 ENCOUNTER — Other Ambulatory Visit: Payer: Self-pay

## 2020-05-11 ENCOUNTER — Encounter: Payer: Self-pay | Admitting: Cardiology

## 2020-05-11 VITALS — BP 117/60 | HR 68 | Resp 15 | Ht 70.0 in | Wt 198.0 lb

## 2020-05-11 DIAGNOSIS — I10 Essential (primary) hypertension: Secondary | ICD-10-CM

## 2020-05-11 DIAGNOSIS — I25118 Atherosclerotic heart disease of native coronary artery with other forms of angina pectoris: Secondary | ICD-10-CM

## 2020-05-11 DIAGNOSIS — E7800 Pure hypercholesterolemia, unspecified: Secondary | ICD-10-CM | POA: Diagnosis not present

## 2020-05-11 DIAGNOSIS — I480 Paroxysmal atrial fibrillation: Secondary | ICD-10-CM | POA: Diagnosis not present

## 2020-05-11 DIAGNOSIS — I6523 Occlusion and stenosis of bilateral carotid arteries: Secondary | ICD-10-CM | POA: Diagnosis not present

## 2020-05-11 MED ORDER — EZETIMIBE 10 MG PO TABS: 10.0000 mg | ORAL_TABLET | Freq: Every day | ORAL | 1 refills | Status: DC

## 2020-05-11 NOTE — Progress Notes (Signed)
Primary Physician/Referring:  Alroy Dust, L.Marlou Sa, MD  Patient ID: Cody Hodge, male    DOB: 1945/08/19, 74 y.o.   MRN: [id number]  Chief Complaint  Patient presents with  . Follow-up    6 month  . Atrial Fibrillation   HPI:    Cody Hodge  is a 74 y.o.  male  with  paroxysmal atrial fibrillation, hyperlipidemia, asymptomatic bilateral carotid artery stenosis, CAD S/P coronary angiography on 03/17/2017 and underwent stenting to his very large PL branch of RCA and has residual; D1 and D2 stenosis recommended medical therapy due to anatomy and an occluded small circumflex OM-1, now presents for follow-up. Of note, patient lives in [place] during winters and here in [place] during most other months.  Patient is presently doing well and continues to exercise on a regular basis by playing tennis.  He denies chest pain, shortness of breath, palpitations, syncope, said symptoms suggestive of TIA or CVA.  He is tolerating Eliquis well without bleeding diathesis. He states he is leaving for [facility] in about 1 month and will return to [place] likely in May 2022.   Past Medical History:  Diagnosis Date  . Arthritis   . BPH (benign prostatic hyperplasia)   . Coronary artery disease 09/2014   50% mid to distal LAD, 50-70% ostial first diag, 40% ostial and mid left circ.  . ED (erectile dysfunction)   . Hemorrhoids    associated with bowel movements  . HTN (hypertension)   . Hypercholesteremia   . Knee arthropathy   . Melanoma ([place])   . Melanoma ([place])   . Mixed hyperlipidemia   . OSA (obstructive sleep apnea)    mild to moderate refused CPAP  . PAF (paroxysmal atrial fibrillation) (HCC)    CHADS VASC score 2 (age>65 and HTN)  . Tremor    Past Surgical History:  Procedure Laterality Date  . CARDIAC CATHETERIZATION     2'16- only ever heart cath  . CATARACT EXTRACTION, BILATERAL    . CORONARY STENT INTERVENTION N/A 03/18/2017   Procedure: CORONARY STENT INTERVENTION;  Surgeon: Nigel Mormon,  MD;  Location: [facility];  Service: Cardiovascular;  Laterality: N/A;  . HYDROCELE EXCISION / REPAIR    . LASIK    . left and right arthroscopy    . left forearm melanoma    . LEFT HEART CATH AND CORONARY ANGIOGRAPHY N/A 03/18/2017   Procedure: Left Heart Cath and Coronary Angiography;  Surgeon: Nigel Mormon, MD;  Location: [facility];  Service: Cardiovascular;  Laterality: N/A;  . LEFT HEART CATHETERIZATION WITH CORONARY ANGIOGRAM N/A 10/06/2014   Procedure: LEFT HEART CATHETERIZATION WITH CORONARY ANGIOGRAM;  Surgeon: Sinclair Grooms, MD;  Location: [facility];  Service: Cardiovascular;  Laterality: N/A;  . TOTAL KNEE ARTHROPLASTY Bilateral 03/29/2015   Procedure: TOTAL KNEE BILATERAL;  Surgeon: Gaynelle Arabian, MD;  Location: [facility];  Service: Orthopedics;  Laterality: Bilateral;  spinal anes. placed in OR   Family History  Problem Relation Age of Onset  . Dementia Mother   . Heart attack Father   . Heart disease Father     Social History   Tobacco Use  . Smoking status: Never Smoker  . Smokeless tobacco: Never Used  Substance Use Topics  . Alcohol use: Yes    Comment: 4 drinks per week    Marital Status: Married  ROS  Review of Systems  Cardiovascular: Negative for dyspnea on exertion, leg swelling and syncope.  Respiratory: Negative for  shortness of breath.   Musculoskeletal: Positive for joint pain. Negative for joint swelling.  Gastrointestinal: Positive for hemorrhoids (chronic). Negative for melena.   Objective  Blood pressure 117/60, pulse 68, resp. rate 15, height _0  (1.778 m), weight 198 lb (89.8 kg), SpO2 98 %.  Vitals with BMI 05/11/2020 12/27/2019 02/03/2019  Height _1  _2  _3   Weight 198 lbs 193 lbs 13 oz 195 lbs 2 oz  BMI 28.41 39.76 73.41  Systolic 937 902 409  Diastolic 60 65 65  Pulse 68 67 62     Physical Exam Constitutional:      General: He is not in acute distress.    Appearance: He is well-developed.  Neck:      Thyroid: No thyromegaly.  Cardiovascular:     Rate and Rhythm: Normal rate and regular rhythm.     Pulses: Intact distal pulses.          Carotid pulses are 2+ on the right side with bruit and 2+ on the left side with bruit.    Heart sounds: No murmur heard.  No gallop.      Comments: No leg edema, no JVD.  Soft bilateral carotid bruit noted on exam.  Pulmonary:     Effort: No accessory muscle usage.     Breath sounds: Normal breath sounds.  Abdominal:     General: Bowel sounds are normal.     Palpations: Abdomen is soft.    Laboratory examination:   Recent Labs    12/23/19 0915  NA 139  K 5.2  CL 103  CO2 26  GLUCOSE 88  BUN 26  CREATININE 1.17  CALCIUM 9.4  GFRNONAA 61  GFRAA 71   CrCl cannot be calculated (Patient's most recent lab result is older than the maximum 21 days allowed.).  CMP Latest Ref Rng & Units 12/23/2019 03/16/2019 12/06/2017  Glucose 65 - 99 mg/dL 88 90 103(H)  BUN 8 - 27 mg/dL 26 26 24(H)  Creatinine 0.76 - 1.27 mg/dL 1.17 1.14 1.03  Sodium 134 - 144 mmol/L 139 141 136  Potassium 3.5 - 5.2 mmol/L 5.2 5.0 3.8  Chloride 96 - 106 mmol/L 103 102 101  CO2 20 - 29 mmol/L _4 Calcium 8.6 - 10.2 mg/dL 9.4 9.4 9.0  Total Protein 6.0 - 8.5 g/dL 6.6 6.2 -  Total Bilirubin 0.0 - 1.2 mg/dL 0.4 0.5 -  Alkaline Phos 39 - 117 IU/L 128(H) 118(H) -  AST 0 - 40 IU/L 41(H) 45(H) -  ALT 0 - 44 IU/L 54(H) 63(H) -   CBC Latest Ref Rng & Units 12/23/2019 03/16/2019 12/06/2017  WBC 3.4 - 10.8 x10E3/uL 8.6 7.8 10.5  Hemoglobin 13.0 - 17.7 g/dL 12.9(L) 14.1 12.5(L)  Hematocrit 37.5 - 51.0 % 41.0 42.0 38.0(L)  Platelets 150 - 450 x10E3/uL 329 296 346   Lipid Panel Recent Labs    12/23/19 0915  CHOL 132  TRIG 62  LDLCALC 71  HDL 48    HEMOGLOBIN A1C Lab Results  Component Value Date   HGBA1C 5.3 12/23/2019   TSH Recent Labs    12/23/19 0915  TSH 1.650   External labs:   02/20/2018: HB 13.8/HCT 41.4, platelets 262, normal indicis.  Serum glucose 91  mg, BUN 32, creatinine 1.08, eGFR greater than 60 ML, potassium 4.7, CMP otherwise normal.  Ferritin normal.  Total cholesterol 136, triglycerides 100, HDL 42, LDL 74.  Medications and allergies  No Known Allergies   Current Outpatient  Medications on File Prior to Visit  Medication Sig Dispense Refill  . atorvastatin (LIPITOR) 20 MG tablet TAKE 1 TABLET BY MOUTH EVERY DAY IN THE MORNING 90 tablet 0  . cholecalciferol (VITAMIN D) 1000 units tablet Take 1,000 Units by mouth daily.    . Coenzyme Q10 (CO Q-10) 200 MG CAPS Take 200 mg by mouth daily.    Marland Kitchen ELIQUIS 5 MG TABS tablet TAKE 1 TABLET (5 MG TOTAL) BY MOUTH 2 (TWO) TIMES DAILY. SCHEDULE APPOINTMENT FOR REFILLS 180 tablet 0  . ferrous sulfate 325 (65 FE) MG tablet Take 325 mg by mouth daily.    . finasteride (PROSCAR) 5 MG tablet Take 5 mg by mouth daily.     Marland Kitchen ibuprofen (ADVIL,MOTRIN) 200 MG tablet Take 400 mg by mouth 2 (two) times daily as needed for moderate pain.    . metoprolol succinate (TOPROL-XL) 25 MG 24 hr tablet TAKE 1 TABLET (25 MG TOTAL) BY MOUTH DAILY. PLEASE SCHEDULE APPOINTMENT FOR REFILLS 90 tablet 0  . Multiple Vitamin (MULTIVITAMIN WITH MINERALS) TABS tablet Take 1 tablet by mouth daily.    Marland Kitchen OVER THE COUNTER MEDICATION Take 2 capsules by mouth every morning. elysium    . Polyethyl Glycol-Propyl Glycol (SYSTANE OP) Place 1 drop into both eyes 2 (two) times daily.     . ramipril (ALTACE) 5 MG capsule TAKE 1 CAPSULE BY MOUTH EVERY DAY 90 capsule 3  . Resveratrol 250 MG CAPS Take 250 mg by mouth 2 (two) times daily.    . TURMERIC PO Take 1 capsule by mouth 2 (two) times daily.      No current facility-administered medications on file prior to visit.    Radiology:   No results found.  Cardiac Studies:   Coronary angiogram 03/18/2017 : RCA Large PL-2 90% stenosis, stenting with SYNERGY 3.0 X 28 mm DES. Left main: Distal 20% stenosis. LAD: D-1 & D-2 80% proximal stenoses, not suitable for coronary intervention due to  acute angle unless symptoms. Cx Small nondominant vessel with ostial 90% stenosis, followed by subtotal Mid OM-1.  Echocardiogram 01/23/2017: Left ventricle cavity is normal in size. Mild asymmetric hypertrophy of the left ventricle. Normal global wall motion. Visual EF is 50-55%. Normal diastolic filling pattern, normal LAP. Left atrial cavity is mildly dilated at 4.2 cm. Trace tricuspid regurgitation. Mild pulmonary hypertension. Pulmonary artery systolic pressure is estimated at 33 mm Hg. IVC is minimally dilated with blunted respiratory response. May suggest elevated central venous pressure. Compared to 07/70/2016, mild pulmonary hypertension new.  Vascular Ultrasound Lower Extremity Venous 11/20/2017: Right: Probable acute DVT in the proximal and mid peroneal veins.  Pulsatile venous flow noted in the CFV. SFJ, DFV, and popliteal vein.  Interstitial fluid noted posterior knee.  Left: No evidence of common femoral vein obstruction.  Carotid artery duplex  05/01/2020: Stenosis in the right common carotid artery (<50%). Stenosis in the right internal carotid artery (16-49%). Stenosis in the right external carotid artery (<50%). Mild stenosis in the left common carotid artery (<50%). Stenosis in the left internal carotid artery (>=70%). The left PSV internal/common carotid artery ratio of 4.06 is consistent with a stenosis of >70%. Stenosis in the left external carotid artery (<50%). Antegrade right vertebral artery flow. Antegrade left vertebral artery flow. Follow up in six months is appropriate if clinically indicated. Compared to 03/22/2019, there is progression in bilateral carotid artery stenosis.   EKG  EKG 12/27/2019: Normal sinus rhythm at rate of 65 bpm, left atrial enlargement, normal axis.  No evidence of ischemia, normal EKG.  Normal QT interval.  No significant change from 02/03/2019.   First-degree AV block not present.  Assessment     ICD-10-CM   1. Coronary artery  disease of native artery of native heart with stable angina pectoris ([place])  I25.118   2. PAF (paroxysmal atrial fibrillation) ([place]). CHA2DS2-VASc Score is 5. Yearly risk of stroke: 6.7% (A, CAD, HTN, TIA-retinal infarct).    I48.0 EKG 12-Lead  3. Asymptomatic bilateral carotid artery stenosis  I65.23   4. Hypercholesteremia  E78.00 ezetimibe (ZETIA) 10 MG tablet    Lipid Panel With LDL/HDL Ratio    Lipid Panel With LDL/HDL Ratio  5. Essential hypertension  I10       Meds ordered this encounter  Medications  . ezetimibe (ZETIA) 10 MG tablet    Sig: Take 1 tablet (10 mg total) by mouth daily.    Dispense:  90 tablet    Refill:  1    Medications Discontinued During This Encounter  Medication Reason  . flecainide (TAMBOCOR) 100 MG tablet Discontinued by provider    Recommendations:   Cody Hodge  is a 74 y.o.  male  with  paroxysmal atrial fibrillation, hyperlipidemia, asymptomatic bilateral carotid artery stenosis, CAD S/P coronary angiography on 03/17/2017 and underwent stenting to his very large PL branch of RCA and has residual; D1 and D2 stenosis recommended medical therapy due to anatomy and an occluded small circumflex OM-1, now presents for follow-up. Of note, patient lives in [place] during winters and here in [place] during most other months.  Patient presents for follow-up and is presently doing well without recurrence of angina pectoris and no known episodes of atrial fibrillation since last visit.  In view of known coronary artery disease, no recurrence of atrial fibrillation, and no EKG evidence of history of atrial fibrillation available to our office recommended patient discontinue flecainide.  He has been doing well since stopping the flecainide reports no known episodes of atrial fibrillation.   Reviewed carotid artery duplex from 05/01/2020 which revealed progression in bilateral carotid artery stenosis compared to previous study.  In view of this progression and most recent LDL  of 71 on 12/23/2019 will continue atorvastatin 20 mg daily and add Zetia 10 mg daily.  Will recheck lipid panel in 1 month before he leaves for [place] at the end of next month. Will continue to monitor carotid stenosis with carotid artery duplex every 6 months.  Blood pressure remains well controlled, will continue ramipril 5 mg daily and metoprolol succinate 25 mg daily.  Follow-up in 6 months after repeat carotid artery duplex.  Patient was seen in collaboration with Dr. Einar Gip. He also reviewed patient's chart and examined the patient. Dr. Einar Gip is in agreement of the plan.    Alethia Berthold, PA-C 05/11/2020, 4:52 PM Office: [phone]

## 2020-05-15 ENCOUNTER — Other Ambulatory Visit: Payer: Self-pay | Admitting: Cardiology

## 2020-05-15 ENCOUNTER — Ambulatory Visit: Payer: Medicare Other | Admitting: Cardiology

## 2020-05-15 DIAGNOSIS — Z85828 Personal history of other malignant neoplasm of skin: Secondary | ICD-10-CM | POA: Diagnosis not present

## 2020-05-15 DIAGNOSIS — L570 Actinic keratosis: Secondary | ICD-10-CM | POA: Diagnosis not present

## 2020-05-15 DIAGNOSIS — L821 Other seborrheic keratosis: Secondary | ICD-10-CM | POA: Diagnosis not present

## 2020-05-15 DIAGNOSIS — Z85820 Personal history of malignant melanoma of skin: Secondary | ICD-10-CM | POA: Diagnosis not present

## 2020-05-15 DIAGNOSIS — D1801 Hemangioma of skin and subcutaneous tissue: Secondary | ICD-10-CM | POA: Diagnosis not present

## 2020-05-15 DIAGNOSIS — L738 Other specified follicular disorders: Secondary | ICD-10-CM | POA: Diagnosis not present

## 2020-05-15 DIAGNOSIS — L309 Dermatitis, unspecified: Secondary | ICD-10-CM | POA: Diagnosis not present

## 2020-06-06 DIAGNOSIS — E7800 Pure hypercholesterolemia, unspecified: Secondary | ICD-10-CM | POA: Diagnosis not present

## 2020-06-07 DIAGNOSIS — Z23 Encounter for immunization: Secondary | ICD-10-CM | POA: Diagnosis not present

## 2020-06-07 LAB — LIPID PANEL WITH LDL/HDL RATIO
Cholesterol, Total: 120 mg/dL (ref 100–199)
HDL: 43 mg/dL (ref >39)
LDL Chol Calc (NIH): 62 mg/dL (ref 0–99)
LDL/HDL Ratio: 1.4 ratio (ref 0.0–3.6)
Triglycerides: 75 mg/dL (ref 0–149)
VLDL Cholesterol Cal: 15 mg/dL (ref 5–40)

## 2020-06-12 ENCOUNTER — Other Ambulatory Visit: Payer: Medicare Other

## 2020-06-13 ENCOUNTER — Other Ambulatory Visit: Payer: Self-pay

## 2020-06-13 MED ORDER — METOPROLOL SUCCINATE ER 25 MG PO TB24
25.0000 mg | ORAL_TABLET | Freq: Every day | ORAL | 2 refills | Status: DC
Start: 2020-06-13 — End: 2020-11-20

## 2020-07-10 ENCOUNTER — Ambulatory Visit: Payer: Medicare Other | Admitting: Cardiology

## 2020-08-14 ENCOUNTER — Other Ambulatory Visit: Payer: Self-pay | Admitting: Cardiology

## 2020-08-14 DIAGNOSIS — E7800 Pure hypercholesterolemia, unspecified: Secondary | ICD-10-CM

## 2020-08-14 DIAGNOSIS — E782 Mixed hyperlipidemia: Secondary | ICD-10-CM

## 2020-08-25 DIAGNOSIS — M7711 Lateral epicondylitis, right elbow: Secondary | ICD-10-CM | POA: Diagnosis not present

## 2020-08-25 DIAGNOSIS — I10 Essential (primary) hypertension: Secondary | ICD-10-CM | POA: Diagnosis not present

## 2020-08-25 DIAGNOSIS — Z79899 Other long term (current) drug therapy: Secondary | ICD-10-CM | POA: Diagnosis not present

## 2020-08-25 DIAGNOSIS — E7800 Pure hypercholesterolemia, unspecified: Secondary | ICD-10-CM | POA: Diagnosis not present

## 2020-08-25 DIAGNOSIS — Z7901 Long term (current) use of anticoagulants: Secondary | ICD-10-CM | POA: Diagnosis not present

## 2020-10-13 ENCOUNTER — Other Ambulatory Visit: Payer: Medicare Other

## 2020-11-12 DIAGNOSIS — Z23 Encounter for immunization: Secondary | ICD-10-CM | POA: Diagnosis not present

## 2020-11-13 ENCOUNTER — Other Ambulatory Visit: Payer: Self-pay | Admitting: Cardiology

## 2020-11-13 DIAGNOSIS — E782 Mixed hyperlipidemia: Secondary | ICD-10-CM

## 2020-11-14 DIAGNOSIS — K642 Third degree hemorrhoids: Secondary | ICD-10-CM | POA: Diagnosis not present

## 2020-11-20 ENCOUNTER — Other Ambulatory Visit: Payer: Self-pay

## 2020-11-20 DIAGNOSIS — E7800 Pure hypercholesterolemia, unspecified: Secondary | ICD-10-CM

## 2020-11-20 DIAGNOSIS — E782 Mixed hyperlipidemia: Secondary | ICD-10-CM

## 2020-11-20 MED ORDER — ATORVASTATIN CALCIUM 20 MG PO TABS: ORAL_TABLET | ORAL | 0 refills | Status: DC

## 2020-11-20 MED ORDER — METOPROLOL SUCCINATE ER 25 MG PO TB24
25.0000 mg | ORAL_TABLET | Freq: Every day | ORAL | 2 refills | Status: DC
Start: 2020-11-20 — End: 2021-08-07

## 2020-11-20 MED ORDER — ELIQUIS 5 MG PO TABS: 5.0000 mg | ORAL_TABLET | Freq: Two times a day (BID) | ORAL | 0 refills | Status: DC

## 2020-11-20 MED ORDER — EZETIMIBE 10 MG PO TABS: 10.0000 mg | ORAL_TABLET | Freq: Every day | ORAL | 1 refills | Status: DC

## 2020-11-20 MED ORDER — RAMIPRIL 5 MG PO CAPS
5.0000 mg | ORAL_CAPSULE | Freq: Every day | ORAL | 0 refills | Status: DC
Start: 2020-11-20 — End: 2020-11-30

## 2020-11-23 ENCOUNTER — Encounter: Payer: Self-pay | Admitting: Student

## 2020-11-23 ENCOUNTER — Other Ambulatory Visit: Payer: Self-pay

## 2020-11-23 DIAGNOSIS — E782 Mixed hyperlipidemia: Secondary | ICD-10-CM

## 2020-11-23 MED ORDER — ATORVASTATIN CALCIUM 20 MG PO TABS: ORAL_TABLET | ORAL | 1 refills | Status: DC

## 2020-11-30 ENCOUNTER — Other Ambulatory Visit: Payer: Self-pay

## 2020-11-30 MED ORDER — RAMIPRIL 5 MG PO CAPS: 5.0000 mg | ORAL_CAPSULE | Freq: Every day | ORAL | 0 refills | Status: DC

## 2020-12-05 DIAGNOSIS — Z7901 Long term (current) use of anticoagulants: Secondary | ICD-10-CM | POA: Diagnosis not present

## 2020-12-05 DIAGNOSIS — Z79899 Other long term (current) drug therapy: Secondary | ICD-10-CM | POA: Diagnosis not present

## 2020-12-05 DIAGNOSIS — K649 Unspecified hemorrhoids: Secondary | ICD-10-CM | POA: Diagnosis not present

## 2020-12-05 DIAGNOSIS — K642 Third degree hemorrhoids: Secondary | ICD-10-CM | POA: Diagnosis not present

## 2020-12-05 DIAGNOSIS — K625 Hemorrhage of anus and rectum: Secondary | ICD-10-CM | POA: Diagnosis not present

## 2020-12-14 DIAGNOSIS — K642 Third degree hemorrhoids: Secondary | ICD-10-CM | POA: Diagnosis not present

## 2021-01-05 ENCOUNTER — Ambulatory Visit: Payer: Medicare Other

## 2021-01-05 ENCOUNTER — Other Ambulatory Visit: Payer: Self-pay

## 2021-01-05 DIAGNOSIS — I6523 Occlusion and stenosis of bilateral carotid arteries: Secondary | ICD-10-CM | POA: Diagnosis not present

## 2021-01-08 NOTE — Progress Notes (Signed)
Carotid artery duplex 01/05/2021: Stenosis in the right internal carotid artery (50-69%). Stenosis in the right external carotid artery (<50%). Stenosis in the left internal carotid artery (80-99%). Prox ICA 402/158 cm/S. The left PSV internal/common carotid artery ratio of 5.74 is consistent with a stenosis of >70%. Stenosis in the left external carotid artery (<50%). See enclosed image.  Antegrade right vertebral artery flow. Antegrade left vertebral artery flow. Compared to 05/03/2020, there is progression of ICA stenosis bilaterally.  Consider vascular surgical referral.  Will discuss on OV

## 2021-01-11 ENCOUNTER — Other Ambulatory Visit: Payer: Self-pay

## 2021-01-11 ENCOUNTER — Ambulatory Visit: Payer: Medicare Other | Admitting: Cardiology

## 2021-01-11 ENCOUNTER — Encounter: Payer: Self-pay | Admitting: Cardiology

## 2021-01-11 VITALS — BP 132/74 | HR 65 | Temp 98.1°F | Resp 17 | Ht 70.0 in | Wt 184.2 lb

## 2021-01-11 DIAGNOSIS — I6523 Occlusion and stenosis of bilateral carotid arteries: Secondary | ICD-10-CM | POA: Diagnosis not present

## 2021-01-11 DIAGNOSIS — I480 Paroxysmal atrial fibrillation: Secondary | ICD-10-CM

## 2021-01-11 DIAGNOSIS — E7800 Pure hypercholesterolemia, unspecified: Secondary | ICD-10-CM | POA: Diagnosis not present

## 2021-01-11 DIAGNOSIS — I25118 Atherosclerotic heart disease of native coronary artery with other forms of angina pectoris: Secondary | ICD-10-CM | POA: Diagnosis not present

## 2021-01-11 DIAGNOSIS — I10 Essential (primary) hypertension: Secondary | ICD-10-CM

## 2021-01-11 NOTE — Progress Notes (Signed)
Primary Physician/Referring:  Alroy Dust, L.Marlou Sa, MD  Patient ID: Cody Hodge, male    DOB: 08-02-1946, 75 y.o.   MRN: [id number]  Chief Complaint  Patient presents with  . Follow-up  . Coronary Artery Disease  . Atrial Fibrillation  . Hyperlipidemia    6 month   HPI:    Cody Hodge  is a 75 y.o.  male  with  paroxysmal atrial fibrillation, hyperlipidemia, asymptomatic bilateral carotid artery stenosis, CAD S/P coronary angiography on 03/17/2017 and underwent stenting to his very large PL branch of RCA and has residual; D1 and D2 stenosis recommended medical therapy due to anatomy and an occluded small circumflex OM-1, now presents for follow-up. Of note, patient lives in [place] during winters and here in [place] during most other months.  Patient is presently doing well and continues to exercise on a regular basis by playing tennis.  He denies chest pain, shortness of breath, palpitations, syncope, said symptoms suggestive of TIA or CVA.  He is tolerating Eliquis well without bleeding diathesis.  About a month ago he had sudden onset dizziness and near syncope with any stood up and walked in the house but states that he just returned from tennis.  Past Medical History:  Diagnosis Date  . Arthritis   . BPH (benign prostatic hyperplasia)   . Coronary artery disease 09/2014   50% mid to distal LAD, 50-70% ostial first diag, 40% ostial and mid left circ.  . ED (erectile dysfunction)   . Hemorrhoids    associated with bowel movements  . HTN (hypertension)   . Hypercholesteremia   . Knee arthropathy   . Melanoma ([place])   . Melanoma ([place])   . Mixed hyperlipidemia   . OSA (obstructive sleep apnea)    mild to moderate refused CPAP  . PAF (paroxysmal atrial fibrillation) (HCC)    CHADS VASC score 2 (age>65 and HTN)  . Postoperative anemia due to acute blood loss 04/03/2015  . Tremor    Past Surgical History:  Procedure Laterality Date  . CARDIAC CATHETERIZATION     2'16- only ever  heart cath  . CATARACT EXTRACTION, BILATERAL    . CORONARY STENT INTERVENTION N/A 03/18/2017   Procedure: CORONARY STENT INTERVENTION;  Surgeon: Nigel Mormon, MD;  Location: [facility];  Service: Cardiovascular;  Laterality: N/A;  . HEMORROIDECTOMY    . HYDROCELE EXCISION / REPAIR    . LASIK    . left and right arthroscopy    . left forearm melanoma    . LEFT HEART CATH AND CORONARY ANGIOGRAPHY N/A 03/18/2017   Procedure: Left Heart Cath and Coronary Angiography;  Surgeon: Nigel Mormon, MD;  Location: [facility];  Service: Cardiovascular;  Laterality: N/A;  . LEFT HEART CATHETERIZATION WITH CORONARY ANGIOGRAM N/A 10/06/2014   Procedure: LEFT HEART CATHETERIZATION WITH CORONARY ANGIOGRAM;  Surgeon: Sinclair Grooms, MD;  Location: [facility];  Service: Cardiovascular;  Laterality: N/A;  . TOTAL KNEE ARTHROPLASTY Bilateral 03/29/2015   Procedure: TOTAL KNEE BILATERAL;  Surgeon: Gaynelle Arabian, MD;  Location: [facility];  Service: Orthopedics;  Laterality: Bilateral;  spinal anes. placed in OR   Family History  Problem Relation Age of Onset  . Dementia Mother   . Heart attack Father   . Heart disease Father   . Alzheimer's disease Sister     Social History   Tobacco Use  . Smoking status: Never Smoker  . Smokeless tobacco: Never Used  Substance Use Topics  .  Alcohol use: Yes    Comment: 4 drinks per week    Marital Status: Married  ROS  Review of Systems  Cardiovascular: Negative for dyspnea on exertion, leg swelling and syncope.  Respiratory: Negative for shortness of breath.   Musculoskeletal: Positive for joint pain. Negative for joint swelling.  Gastrointestinal: Positive for hemorrhoids (chronic). Negative for melena.   Objective  Blood pressure 132/74, pulse 65, temperature 98.1 F (36.7 C), temperature source Temporal, resp. rate 17, height $RemoveBe'5\' 10"'eeHeyvKfN$  (1.778 m), weight 184 lb 3.2 oz (83.6 kg), SpO2 94 %.  Vitals with BMI 01/11/2021 05/11/2020 12/27/2019   Height $Remov'5\' 10"'LSZXiQ$  $RemoveB'5\' 10"'ZCzLBKaw$  $RemoveBef'5\' 10"'twIVEVDnAp$   Weight 184 lbs 3 oz 198 lbs 193 lbs 13 oz  BMI 26.43 75.44 92.01  Systolic 007 121 975  Diastolic 74 60 65  Pulse 65 68 67     Physical Exam Constitutional:      General: He is not in acute distress.    Appearance: He is well-developed.  Neck:     Thyroid: No thyromegaly.     Vascular: Carotid bruit (bilateral) present. No JVD.  Cardiovascular:     Rate and Rhythm: Normal rate and regular rhythm.     Pulses: Normal pulses and intact distal pulses.     Heart sounds: No murmur heard. No gallop.   Pulmonary:     Effort: No accessory muscle usage.     Breath sounds: Normal breath sounds.  Abdominal:     General: Bowel sounds are normal.     Palpations: Abdomen is soft.  Musculoskeletal:        General: No swelling. Normal range of motion.    Laboratory examination:   No results for input(s): NA, K, CL, CO2, GLUCOSE, BUN, CREATININE, CALCIUM, GFRNONAA, GFRAA in the last 8760 hours. CrCl cannot be calculated (Patient's most recent lab result is older than the maximum 21 days allowed.).  CMP Latest Ref Rng & Units 12/23/2019 03/16/2019 12/06/2017  Glucose 65 - 99 mg/dL 88 90 103(H)  BUN 8 - 27 mg/dL 26 26 24(H)  Creatinine 0.76 - 1.27 mg/dL 1.17 1.14 1.03  Sodium 134 - 144 mmol/L 139 141 136  Potassium 3.5 - 5.2 mmol/L 5.2 5.0 3.8  Chloride 96 - 106 mmol/L 103 102 101  CO2 20 - 29 mmol/L $RemoveB'26 23 27  'PssSlMPL$ Calcium 8.6 - 10.2 mg/dL 9.4 9.4 9.0  Total Protein 6.0 - 8.5 g/dL 6.6 6.2 -  Total Bilirubin 0.0 - 1.2 mg/dL 0.4 0.5 -  Alkaline Phos 39 - 117 IU/L 128(H) 118(H) -  AST 0 - 40 IU/L 41(H) 45(H) -  ALT 0 - 44 IU/L 54(H) 63(H) -   CBC Latest Ref Rng & Units 12/23/2019 03/16/2019 12/06/2017  WBC 3.4 - 10.8 x10E3/uL 8.6 7.8 10.5  Hemoglobin 13.0 - 17.7 g/dL 12.9(L) 14.1 12.5(L)  Hematocrit 37.5 - 51.0 % 41.0 42.0 38.0(L)  Platelets 150 - 450 x10E3/uL 329 296 346   Lipid Panel Recent Labs    06/06/20 0852  CHOL 120  TRIG 75  LDLCALC 62  HDL 43     HEMOGLOBIN A1C Lab Results  Component Value Date   HGBA1C 5.3 12/23/2019   TSH No results for input(s): TSH in the last 8760 hours. External labs:   02/20/2018: HB 13.8/HCT 41.4, platelets 262, normal indicis.  Serum glucose 91 mg, BUN 32, creatinine 1.08, eGFR greater than 60 ML, potassium 4.7, CMP otherwise normal.  Ferritin normal.  Total cholesterol 136, triglycerides 100, HDL 42, LDL 74.  Medications and allergies  No Known Allergies   Current Outpatient Medications on File Prior to Visit  Medication Sig Dispense Refill  . apixaban (ELIQUIS) 5 MG TABS tablet Take 1 tablet (5 mg total) by mouth 2 (two) times daily. 180 tablet 0  . atorvastatin (LIPITOR) 20 MG tablet TAKE 1 TABLET BY MOUTH EVERY DAY IN THE MORNING 90 tablet 1  . cholecalciferol (VITAMIN D) 1000 units tablet Take 1,000 Units by mouth daily.    . Coenzyme Q10 (CO Q-10) 200 MG CAPS Take 200 mg by mouth daily.    Marland Kitchen ezetimibe (ZETIA) 10 MG tablet Take 1 tablet (10 mg total) by mouth daily. 90 tablet 1  . ferrous sulfate 325 (65 FE) MG tablet Take 325 mg by mouth daily.    . finasteride (PROSCAR) 5 MG tablet Take 5 mg by mouth daily.     Marland Kitchen ibuprofen (ADVIL,MOTRIN) 200 MG tablet Take 400 mg by mouth 2 (two) times daily as needed for moderate pain.    . metoprolol succinate (TOPROL-XL) 25 MG 24 hr tablet Take 1 tablet (25 mg total) by mouth daily. Please schedule appointment for refills 90 tablet 2  . Multiple Vitamin (MULTIVITAMIN WITH MINERALS) TABS tablet Take 1 tablet by mouth daily.    Marland Kitchen OVER THE COUNTER MEDICATION Take 2 capsules by mouth every morning. elysium    . Polyethyl Glycol-Propyl Glycol (SYSTANE OP) Place 1 drop into both eyes 2 (two) times daily.     . ramipril (ALTACE) 5 MG capsule Take 1 capsule (5 mg total) by mouth daily. 90 capsule 0  . Resveratrol 250 MG CAPS Take 250 mg by mouth 2 (two) times daily.    Marland Kitchen triamcinolone cream (KENALOG) 0.1 % Apply 1 application topically as needed.    . TURMERIC PO  Take 1 capsule by mouth 2 (two) times daily.      No current facility-administered medications on file prior to visit.    Radiology:   No results found.  Cardiac Studies:   Coronary angiogram 03/18/2017 : RCA Large PL-2 90% stenosis, stenting with SYNERGY 3.0 X 28 mm DES. Left main: Distal 20% stenosis. LAD: D-1 & D-2 80% proximal stenoses, not suitable for coronary intervention due to acute angle unless symptoms. Cx Small nondominant vessel with ostial 90% stenosis, followed by subtotal Mid OM-1.  Echocardiogram 01/23/2017: Left ventricle cavity is normal in size. Mild asymmetric hypertrophy of the left ventricle. Normal global wall motion. Visual EF is 50-55%. Normal diastolic filling pattern, normal LAP. Left atrial cavity is mildly dilated at 4.2 cm. Trace tricuspid regurgitation. Mild pulmonary hypertension. Pulmonary artery systolic pressure is estimated at 33 mm Hg. IVC is minimally dilated with blunted respiratory response. May suggest elevated central venous pressure. Compared to 07/70/2016, mild pulmonary hypertension new.  Vascular Ultrasound Lower Extremity Venous 11/20/2017: Right: Probable acute DVT in the proximal and mid peroneal veins.  Pulsatile venous flow noted in the CFV. SFJ, DFV, and popliteal vein.  Interstitial fluid noted posterior knee.  Left: No evidence of common femoral vein obstruction.  Carotid artery duplex 01/05/2021: Stenosis in the right internal carotid artery (50-69%). Stenosis in the right external carotid artery (<50%). Stenosis in the left internal carotid artery (80-99%). Prox ICA 402/158 cm/S. The left PSV internal/common carotid artery ratio of 5.74 is consistent with a stenosis of >70%. Stenosis in the left external carotid artery (<50%). See enclosed image.  Antegrade right vertebral artery flow. Antegrade left vertebral artery flow. Compared to 05/03/2020, there is progression of  ICA stenosis bilaterally.  Consider vascular surgical  referral. EKG  EKG 01/11/2021: Normal sinus rhythm at rate of 62 bpm, leftward enlargement, otherwise normal EKG. NO SIGNIFICANT CHANGE FROM 12/27/2019.     Assessment     ICD-10-CM   1. Coronary artery disease of native artery of native heart with stable angina pectoris ([place])  I25.118   2. Essential hypertension  I10 EKG 12-Lead  3. PAF (paroxysmal atrial fibrillation) ([place]). CHA2DS2-VASc Score is 5. Yearly risk of stroke: 6.7% (A, CAD, HTN, TIA-retinal infarct).    I48.0   4. Asymptomatic bilateral carotid artery stenosis  I65.23 Ambulatory referral to Vascular Surgery  5. Hypercholesteremia  E78.00       No orders of the defined types were placed in this encounter.   There are no discontinued medications.  Recommendations:   Cody Hodge  is a 75 y.o.  male  with  paroxysmal atrial fibrillation, hyperlipidemia, asymptomatic bilateral carotid artery stenosis, CAD S/P coronary angiography on 03/17/2017 and underwent stenting to his very large PL branch of RCA and has residual; D1 and D2 stenosis recommended medical therapy due to anatomy and an occluded small circumflex OM-1, now presents for follow-up. Of note, patient lives in [place] during winters and here in [place] during most other months.  Patient presents for follow-up and is presently doing well without recurrence of angina pectoris and carotid disease. In view of known coronary artery disease, hypertensin, age as cardioembolic risk, presently on Eliquis per her choice, no recurrence of atrial fibrillation, and no EKG evidence of history of atrial fibrillation available to our office.  Overall he has not had any angina pectoris and blood pressure is well controlled and lipids are at goal.  In spite of this patient's carotid artery disease has clearly progressed especially the left, will refer him to be evaluated by Dr. Annamarie Major.  Follow-up in 3 months.   Adrian Prows, MD, [facility] 01/11/2021, 9:42 PM Office: [phone] Fax:  [phone] Pager: [phone]

## 2021-02-02 ENCOUNTER — Other Ambulatory Visit: Payer: Self-pay | Admitting: Cardiology

## 2021-02-05 ENCOUNTER — Encounter: Payer: Self-pay | Admitting: Surgery

## 2021-02-05 ENCOUNTER — Other Ambulatory Visit: Payer: Self-pay

## 2021-02-05 ENCOUNTER — Ambulatory Visit (INDEPENDENT_AMBULATORY_CARE_PROVIDER_SITE_OTHER): Payer: Medicare Other | Admitting: Surgery

## 2021-02-05 VITALS — BP 120/72 | HR 67 | Temp 98.1°F | Resp 20 | Ht 70.0 in | Wt 183.5 lb

## 2021-02-05 DIAGNOSIS — I6523 Occlusion and stenosis of bilateral carotid arteries: Secondary | ICD-10-CM | POA: Diagnosis not present

## 2021-02-05 NOTE — Progress Notes (Signed)
[facility]  Patient name: Cody Hodge MRN: [id number] DOB: Nov 02, 1945 Sex: male   REQUESTING PROVIDER:    Dr. Einar Gip   REASON FOR CONSULT:    Carotid stensosi  HISTORY OF PRESENT ILLNESS:   Cody Hodge is a 75 y.o. male, who is referred for evaluation of bilateral carotid stenosis, left greater than right.  The patient is asymptomatic.  Specifically, he denies numbness or weakness in either extremity.  He denies slurred speech.  He denies amaurosis fugax.  The patient suffers from paroxysmal atrial fibrillation.  He is on Eliquis for this.  He takes a statin for hypercholesterolemia.  He has coronary artery disease, status post angiography and PCI in 2018.  PAST MEDICAL HISTORY    Past Medical History:  Diagnosis Date   Arthritis    BPH (benign prostatic hyperplasia)    Coronary artery disease 09/2014   50% mid to distal LAD, 50-70% ostial first diag, 40% ostial and mid left circ.   ED (erectile dysfunction)    Hemorrhoids    associated with bowel movements   HTN (hypertension)    Hypercholesteremia    Knee arthropathy    Melanoma (HCC)    Melanoma (HCC)    Mixed hyperlipidemia    OSA (obstructive sleep apnea)    mild to moderate refused CPAP   PAF (paroxysmal atrial fibrillation) (HCC)    CHADS VASC score 2 (age>65 and HTN)   Postoperative anemia due to acute blood loss 04/03/2015   Tremor      FAMILY HISTORY   Family History  Problem Relation Age of Onset   Dementia Mother    Heart attack Father    Heart disease Father    Alzheimer's disease Sister     SOCIAL HISTORY:   Social History   Socioeconomic History   Marital status: Married    Spouse name: Not on file   Number of children: 3   Years of education: Not on file   Highest education level: Not on file  Occupational History   Not on file  Tobacco Use   Smoking status: Never   Smokeless tobacco: Never  Vaping Use    Vaping Use: Never used  Substance and Sexual Activity   Alcohol use: Yes    Comment: 4 drinks per week   Drug use: No   Sexual activity: Not on file  Other Topics Concern   Not on file  Social History Narrative   Not on file   Social Determinants of Health   Financial Resource Strain: Not on file  Food Insecurity: Not on file  Transportation Needs: Not on file  Physical Activity: Not on file  Stress: Not on file  Social Connections: Not on file  Intimate Partner Violence: Not on file    ALLERGIES:    No Known Allergies  CURRENT MEDICATIONS:    Current Outpatient Medications  Medication Sig Dispense Refill   atorvastatin (LIPITOR) 20 MG tablet TAKE 1 TABLET BY MOUTH EVERY DAY IN THE MORNING 90 tablet 1   cholecalciferol (VITAMIN D) 1000 units tablet Take 1,000 Units by mouth daily.     Coenzyme Q10 (CO Q-10) 200 MG CAPS Take 200 mg by mouth daily.     ELIQUIS 5 MG TABS tablet TAKE 1 TABLET TWICE A DAY 180 tablet 3   ezetimibe (ZETIA) 10 MG tablet Take 1 tablet (10 mg total) by mouth daily. 90 tablet 1   ferrous sulfate 325 (65 FE) MG tablet Take 325  mg by mouth daily.     finasteride (PROSCAR) 5 MG tablet Take 5 mg by mouth daily.      ibuprofen (ADVIL,MOTRIN) 200 MG tablet Take 400 mg by mouth 2 (two) times daily as needed for moderate pain.     metoprolol succinate (TOPROL-XL) 25 MG 24 hr tablet Take 1 tablet (25 mg total) by mouth daily. Please schedule appointment for refills 90 tablet 2   Multiple Vitamin (MULTIVITAMIN WITH MINERALS) TABS tablet Take 1 tablet by mouth daily.     OVER THE COUNTER MEDICATION Take 2 capsules by mouth every morning. elysium     Polyethyl Glycol-Propyl Glycol (SYSTANE OP) Place 1 drop into both eyes 2 (two) times daily.      ramipril (ALTACE) 5 MG capsule Take 1 capsule (5 mg total) by mouth daily. 90 capsule 0   Resveratrol 250 MG CAPS Take 250 mg by mouth 2 (two) times daily.     triamcinolone cream (KENALOG) 0.1 % Apply 1 application  topically as needed.     TURMERIC PO Take 1 capsule by mouth 2 (two) times daily.      No current facility-administered medications for this visit.    REVIEW OF SYSTEMS:   [X]  denotes positive finding, [ ]  denotes negative finding Cardiac  Comments:  Chest pain or chest pressure:    Shortness of breath upon exertion:    Short of breath when lying flat:    Irregular heart rhythm:        Vascular    Pain in calf, thigh, or hip brought on by ambulation:    Pain in feet at night that wakes you up from your sleep:     Blood clot in your veins:    Leg swelling:         Pulmonary    Oxygen at home:    Productive cough:     Wheezing:         Neurologic    Sudden weakness in arms or legs:     Sudden numbness in arms or legs:     Sudden onset of difficulty speaking or slurred speech:    Temporary loss of vision in one eye:     Problems with dizziness:         Gastrointestinal    Blood in stool:      Vomited blood:         Genitourinary    Burning when urinating:     Blood in urine:        Psychiatric    Major depression:         Hematologic    Bleeding problems:    Problems with blood clotting too easily:        Skin    Rashes or ulcers:        Constitutional    Fever or chills:     PHYSICAL EXAM:   Vitals:   02/05/21 1026  BP: 118/71  Pulse: 67  Resp: 20  Temp: 98.1 F (36.7 C)  SpO2: 96%  Weight: 183 lb 8 oz (83.2 kg)  Height: 5\' 10"  (1.778 m)    GENERAL: The patient is a well-nourished male, in no acute distress. The vital signs are documented above. CARDIAC: There is a regular rate and rhythm.  VASCULAR: Palpable dorsalis pedis and radial pulses bilaterally PULMONARY: Nonlabored respirations ABDOMEN: Soft and non-tender.  No pulsatile mass MUSCULOSKELETAL: There are no major deformities or cyanosis. NEUROLOGIC: No focal weakness or paresthesias are detected.  SKIN: There are no ulcers or rashes noted. PSYCHIATRIC: The patient has a normal  affect.  STUDIES:   I have reviewed his duplex with the following findings:  Carotid artery duplex 01/05/2021: Stenosis in the right internal carotid artery (50-69%). Stenosis in the rightexternal carotid artery (<50%). Stenosis in the left internal carotid artery (80-99%). Prox ICA 402/158 cm/S. The left PSV internal/common carotid artery ratio of 5.74 is consistent with a stenosis of >70%. Stenosis in the left externalcarotid artery (<50%). See enclosed image.  Antegrade right vertebral artery flow. Antegrade left vertebral artery flow. Compared to 05/03/2020, there is progression of ICA stenosis bilaterally.   ASSESSMENT and PLAN   Asymptomatic carotid stenosis, left greater than right.  We discussed 2 options for treatment, carotid endarterectomy versus TCAR.  We talked about the pros and cons of each.  We are leaning towards TCAR, therefore I will need to get a CT angiogram of the neck to make sure he has the appropriate anatomy for stenting.  I will get that done in the near future and have him follow-up for further discussions.   Leia Alf, MD, FACS [facility] [phone] Pager [phone]

## 2021-02-19 ENCOUNTER — Ambulatory Visit: Payer: Medicare Other | Admitting: Surgery

## 2021-02-21 ENCOUNTER — Inpatient Hospital Stay (HOSPITAL_COMMUNITY)
Admission: EM | Admit: 2021-02-21 | Discharge: 2021-02-23 | DRG: 379 | Disposition: A | Discharge status: Home / Self Care | Payer: Medicare Other | Attending: Family Medicine | Admitting: Family Medicine

## 2021-02-21 ENCOUNTER — Other Ambulatory Visit: Payer: Self-pay

## 2021-02-21 ENCOUNTER — Encounter (HOSPITAL_COMMUNITY): Payer: Self-pay

## 2021-02-21 DIAGNOSIS — K644 Residual hemorrhoidal skin tags: Secondary | ICD-10-CM | POA: Diagnosis present

## 2021-02-21 DIAGNOSIS — E7800 Pure hypercholesterolemia, unspecified: Secondary | ICD-10-CM | POA: Diagnosis not present

## 2021-02-21 DIAGNOSIS — K922 Gastrointestinal hemorrhage, unspecified: Secondary | ICD-10-CM

## 2021-02-21 DIAGNOSIS — E782 Mixed hyperlipidemia: Secondary | ICD-10-CM | POA: Diagnosis present

## 2021-02-21 DIAGNOSIS — Z7901 Long term (current) use of anticoagulants: Secondary | ICD-10-CM

## 2021-02-21 DIAGNOSIS — Z8249 Family history of ischemic heart disease and other diseases of the circulatory system: Secondary | ICD-10-CM

## 2021-02-21 DIAGNOSIS — I25118 Atherosclerotic heart disease of native coronary artery with other forms of angina pectoris: Secondary | ICD-10-CM | POA: Diagnosis not present

## 2021-02-21 DIAGNOSIS — G4733 Obstructive sleep apnea (adult) (pediatric): Secondary | ICD-10-CM | POA: Diagnosis present

## 2021-02-21 DIAGNOSIS — I10 Essential (primary) hypertension: Secondary | ICD-10-CM | POA: Diagnosis not present

## 2021-02-21 DIAGNOSIS — I2510 Atherosclerotic heart disease of native coronary artery without angina pectoris: Secondary | ICD-10-CM | POA: Diagnosis present

## 2021-02-21 DIAGNOSIS — K648 Other hemorrhoids: Secondary | ICD-10-CM | POA: Diagnosis present

## 2021-02-21 DIAGNOSIS — I480 Paroxysmal atrial fibrillation: Secondary | ICD-10-CM | POA: Diagnosis not present

## 2021-02-21 DIAGNOSIS — I739 Peripheral vascular disease, unspecified: Secondary | ICD-10-CM | POA: Diagnosis not present

## 2021-02-21 DIAGNOSIS — K625 Hemorrhage of anus and rectum: Secondary | ICD-10-CM | POA: Diagnosis present

## 2021-02-21 DIAGNOSIS — Z820 Family history of epilepsy and other diseases of the nervous system: Secondary | ICD-10-CM

## 2021-02-21 DIAGNOSIS — N400 Enlarged prostate without lower urinary tract symptoms: Secondary | ICD-10-CM | POA: Diagnosis present

## 2021-02-21 DIAGNOSIS — Z20822 Contact with and (suspected) exposure to covid-19: Secondary | ICD-10-CM | POA: Diagnosis present

## 2021-02-21 DIAGNOSIS — M1990 Unspecified osteoarthritis, unspecified site: Secondary | ICD-10-CM | POA: Diagnosis present

## 2021-02-21 DIAGNOSIS — Z79899 Other long term (current) drug therapy: Secondary | ICD-10-CM

## 2021-02-21 DIAGNOSIS — K5731 Diverticulosis of large intestine without perforation or abscess with bleeding: Principal | ICD-10-CM | POA: Diagnosis present

## 2021-02-21 DIAGNOSIS — Z96653 Presence of artificial knee joint, bilateral: Secondary | ICD-10-CM | POA: Diagnosis present

## 2021-02-21 DIAGNOSIS — Z85820 Personal history of malignant melanoma of skin: Secondary | ICD-10-CM

## 2021-02-21 LAB — CBC WITH DIFFERENTIAL/PLATELET
Abs Immature Granulocytes: 0.07 10*3/uL (ref 0.00–0.07)
Basophils Absolute: 0.1 10*3/uL (ref 0.0–0.1)
Basophils Relative: 1 %
Eosinophils Absolute: 0.2 10*3/uL (ref 0.0–0.5)
Eosinophils Relative: 2 %
HCT: 42.2 % (ref 39.0–52.0)
Hemoglobin: 13.7 g/dL (ref 13.0–17.0)
Immature Granulocytes: 1 %
Lymphocytes Relative: 21 %
Lymphs Abs: 2 10*3/uL (ref 0.7–4.0)
MCH: 26.8 pg (ref 26.0–34.0)
MCHC: 32.5 g/dL (ref 30.0–36.0)
MCV: 82.6 fL (ref 80.0–100.0)
Monocytes Absolute: 0.9 10*3/uL (ref 0.1–1.0)
Monocytes Relative: 9 %
Neutro Abs: 6.4 10*3/uL (ref 1.7–7.7)
Neutrophils Relative %: 66 %
Platelets: 313 10*3/uL (ref 150–400)
RBC: 5.11 MIL/uL (ref 4.22–5.81)
RDW: 14.7 % (ref 11.5–15.5)
WBC: 9.6 10*3/uL (ref 4.0–10.5)
nRBC: 0 % (ref 0.0–0.2)

## 2021-02-21 LAB — BASIC METABOLIC PANEL
Anion gap: 5 (ref 5–15)
BUN: 27 mg/dL — ABNORMAL HIGH (ref 8–23)
CO2: 29 mmol/L (ref 22–32)
Calcium: 9.2 mg/dL (ref 8.9–10.3)
Chloride: 103 mmol/L (ref 98–111)
Creatinine, Ser: 1.21 mg/dL (ref 0.61–1.24)
GFR, Estimated: >60 mL/min (ref >60)
Glucose, Bld: 80 mg/dL (ref 70–99)
Potassium: 4.1 mmol/L (ref 3.5–5.1)
Sodium: 137 mmol/L (ref 135–145)

## 2021-02-21 LAB — CBC
HCT: 41.1 % (ref 39.0–52.0)
Hemoglobin: 13.3 g/dL (ref 13.0–17.0)
MCH: 26.7 pg (ref 26.0–34.0)
MCHC: 32.4 g/dL (ref 30.0–36.0)
MCV: 82.5 fL (ref 80.0–100.0)
Platelets: 279 10*3/uL (ref 150–400)
RBC: 4.98 MIL/uL (ref 4.22–5.81)
RDW: 15 % (ref 11.5–15.5)
WBC: 9 10*3/uL (ref 4.0–10.5)
nRBC: 0 % (ref 0.0–0.2)

## 2021-02-21 LAB — TYPE AND SCREEN
ABO/RH(D): A NEG
Antibody Screen: NEGATIVE

## 2021-02-21 LAB — PROTIME-INR
INR: 1.1 (ref 0.8–1.2)
Prothrombin Time: 14 seconds (ref 11.4–15.2)

## 2021-02-21 LAB — POC OCCULT BLOOD, ED: Fecal Occult Bld: POSITIVE — AB

## 2021-02-21 MED ORDER — ONDANSETRON HCL 4 MG/2ML IJ SOLN: 4.0000 mg | Freq: Four times a day (QID) | INTRAMUSCULAR | Status: DC | PRN

## 2021-02-21 MED ORDER — ACETAMINOPHEN 650 MG RE SUPP
650.0000 mg | Freq: Four times a day (QID) | RECTAL | Status: DC | PRN
Start: 2021-02-21 — End: 2021-02-23

## 2021-02-21 MED ORDER — METOPROLOL SUCCINATE ER 25 MG PO TB24
25.0000 mg | ORAL_TABLET | Freq: Every day | ORAL | Status: DC
  Administered 2021-02-22 – 2021-02-23 (×2): 25 mg via ORAL
  Filled 2021-02-21 (×2): qty 1

## 2021-02-21 MED ORDER — ATORVASTATIN CALCIUM 10 MG PO TABS
20.0000 mg | ORAL_TABLET | Freq: Every day | ORAL | Status: DC
  Administered 2021-02-22 – 2021-02-23 (×2): 20 mg via ORAL
  Filled 2021-02-21 (×2): qty 2

## 2021-02-21 MED ORDER — FINASTERIDE 5 MG PO TABS
5.0000 mg | ORAL_TABLET | Freq: Every day | ORAL | Status: DC
  Administered 2021-02-22 – 2021-02-23 (×2): 5 mg via ORAL
  Filled 2021-02-21 (×2): qty 1

## 2021-02-21 MED ORDER — ACETAMINOPHEN 325 MG PO TABS: 650.0000 mg | ORAL_TABLET | Freq: Four times a day (QID) | ORAL | Status: DC | PRN

## 2021-02-21 MED ORDER — RAMIPRIL 5 MG PO CAPS
5.0000 mg | ORAL_CAPSULE | Freq: Every day | ORAL | Status: DC
  Administered 2021-02-22 – 2021-02-23 (×2): 5 mg via ORAL
  Filled 2021-02-21 (×2): qty 1

## 2021-02-21 MED ORDER — ONDANSETRON HCL 4 MG PO TABS: 4.0000 mg | ORAL_TABLET | Freq: Four times a day (QID) | ORAL | Status: DC | PRN

## 2021-02-21 NOTE — H&P (Signed)
History and Physical    [name] FWY:[id number] DOB: [birth year] DOA: 02/21/2021  PCP: Alroy Dust, L.Marlou Sa, MD   Patient coming from: Home   Chief Complaint: Rectal bleeding   HPI: Cody Hodge is a 75 y.o. male with medical history significant for asymptomatic carotid artery stenosis, CAD, hyperlipidemia, hypertension, and paroxysmal atrial fibrillation on Eliquis, now presenting to the emergency department for evaluation of rectal bleeding.  The patient reports painless passage of bright red blood per rectum beginning 4 days ago and increasing in frequency and volume over the past 1 to 2 days.  He has had similar episodes in the past that were attributed to internal hemorrhoids and he underwent hemorrhoidectomy in [place] 2.5 months ago.  Current symptoms involve sensation of needing to defecate followed by passage of bright red blood and clots.  He denies any abdominal pain, nausea, or vomiting.  He takes occasional NSAIDs but not recently.  Last dose of Eliquis was the morning of 02/21/2021.  He had a colonoscopy in 2004 with diverticulosis and internal hemorrhoids noted and does not believe that he has had endoscopy since then.  ED Course: Upon arrival to the ED, patient is found to be afebrile, saturating well on room air, and with stable blood pressure.  Chemistry panel and CBC unremarkable.  Fecal occult blood testing positive.  Gastroenterology was consulted by the ED physician and hospitalists asked to admit.  Review of Systems:  All other systems reviewed and apart from HPI, are negative.  Past Medical History:  Diagnosis Date   Arthritis    BPH (benign prostatic hyperplasia)    Coronary artery disease 09/2014   50% mid to distal LAD, 50-70% ostial first diag, 40% ostial and mid left circ.   ED (erectile dysfunction)    Hemorrhoids    associated with bowel movements   HTN (hypertension)    Hypercholesteremia    Knee arthropathy    Melanoma (HCC)    Melanoma (HCC)     Mixed hyperlipidemia    OSA (obstructive sleep apnea)    mild to moderate refused CPAP   PAF (paroxysmal atrial fibrillation) (HCC)    CHADS VASC score 2 (age>65 and HTN)   Postoperative anemia due to acute blood loss 04/03/2015   Tremor     Past Surgical History:  Procedure Laterality Date   CARDIAC CATHETERIZATION     2'16- only ever heart cath   CATARACT EXTRACTION, BILATERAL     CORONARY STENT INTERVENTION N/A 03/18/2017   Procedure: CORONARY STENT INTERVENTION;  Surgeon: Nigel Mormon, MD;  Location: [facility];  Service: Cardiovascular;  Laterality: N/A;   HEMORROIDECTOMY     HYDROCELE EXCISION / REPAIR     LASIK     left and right arthroscopy     left forearm melanoma     LEFT HEART CATH AND CORONARY ANGIOGRAPHY N/A 03/18/2017   Procedure: Left Heart Cath and Coronary Angiography;  Surgeon: Nigel Mormon, MD;  Location: [facility];  Service: Cardiovascular;  Laterality: N/A;   LEFT HEART CATHETERIZATION WITH CORONARY ANGIOGRAM N/A 10/06/2014   Procedure: LEFT HEART CATHETERIZATION WITH CORONARY ANGIOGRAM;  Surgeon: Sinclair Grooms, MD;  Location: [facility] C[facility];  Service: Cardiovascular;  Laterality: N/A;   TOTAL KNEE ARTHROPLASTY Bilateral 03/29/2015   Procedure: TOTAL KNEE BILATERAL;  Surgeon: Gaynelle Arabian, MD;  Location: [facility];  Service: Orthopedics;  Laterality: Bilateral;  spinal anes. placed in OR    Social History:   reports that he has  never smoked. He has never used smokeless tobacco. He reports current alcohol use. He reports that he does not use drugs.  No Known Allergies  Family History  Problem Relation Age of Onset   Dementia Mother    Heart attack Father    Heart disease Father    Alzheimer's disease Sister      Prior to Admission medications   Medication Sig Start Date End Date Taking? Authorizing Provider  atorvastatin (LIPITOR) 20 MG tablet TAKE 1 TABLET BY MOUTH EVERY DAY IN THE MORNING Patient taking differently: Take 20  mg by mouth daily. TAKE 1 TABLET BY MOUTH EVERY DAY IN THE MORNING 11/23/20  Yes Adrian Prows, MD  cholecalciferol (VITAMIN D) 1000 units tablet Take 1,000 Units by mouth daily.   Yes [provider]  Coenzyme Q10 (CO Q-10) 200 MG CAPS Take 200 mg by mouth daily.   Yes [provider]  ELIQUIS 5 MG TABS tablet TAKE 1 TABLET TWICE A DAY Patient taking differently: Take 5 mg by mouth 2 (two) times daily. 02/02/21  Yes Adrian Prows, MD  ezetimibe (ZETIA) 10 MG tablet Take 1 tablet (10 mg total) by mouth daily. 11/20/20  Yes Adrian Prows, MD  ferrous sulfate 325 (65 FE) MG tablet Take 325 mg by mouth daily.   Yes [provider]  finasteride (PROSCAR) 5 MG tablet Take 5 mg by mouth daily.  05/18/13  Yes [provider]  ibuprofen (ADVIL,MOTRIN) 200 MG tablet Take 400 mg by mouth 2 (two) times daily as needed for moderate pain.   Yes [provider]  metoprolol succinate (TOPROL-XL) 25 MG 24 hr tablet Take 1 tablet (25 mg total) by mouth daily. Please schedule appointment for refills 11/20/20  Yes Adrian Prows, MD  Multiple Vitamin (MULTIVITAMIN WITH MINERALS) TABS tablet Take 1 tablet by mouth daily.   Yes [provider]  OVER THE COUNTER MEDICATION Take 2 capsules by mouth every morning. elysium   Yes [provider]  Polyethyl Glycol-Propyl Glycol (SYSTANE OP) Place 1 drop into both eyes 2 (two) times daily.    Yes [provider]  ramipril (ALTACE) 5 MG capsule Take 1 capsule (5 mg total) by mouth daily. 11/30/20  Yes Adrian Prows, MD  Resveratrol 250 MG CAPS Take 250 mg by mouth 2 (two) times daily.   Yes [provider]  triamcinolone cream (KENALOG) 0.1 % Apply 1 application topically as needed (rash).   Yes [provider]  TURMERIC PO Take 1 capsule by mouth 2 (two) times daily.    Yes [provider]    Physical Exam: Vitals:   02/21/21 2030 02/21/21 2130 02/21/21 2200 02/21/21 2300  BP: 139/76 (!) 147/77  (!) 141/73 (!) 144/76  Pulse: 66 67 68 65  Resp: 16 18 19 16   Temp:      TempSrc:      SpO2: 99% 98% 97% 98%    Constitutional: NAD, calm  Eyes: PERTLA, lids and conjunctivae normal ENMT: Mucous membranes are moist. Posterior pharynx clear of any exudate or lesions.   Neck: supple, no masses  Respiratory:  no wheezing, no crackles. No accessory muscle use.  Cardiovascular: S1 & S2 heard, regular rate and rhythm. No extremity edema.   Abdomen: No distension, no tenderness, soft. Bowel sounds active.  Musculoskeletal: no clubbing / cyanosis. No joint deformity upper and lower extremities.   Skin: no significant rashes, lesions, ulcers. Warm, dry, well-perfused. Neurologic: No gross facial asymmetry. Sensation intact. Moving all extremities.  Psychiatric: Alert and oriented to person, place, and situation. Calm and cooperative.    Labs and Imaging on Admission: I have personally reviewed following labs and imaging studies  CBC: Recent Labs  Lab 02/21/21 1715  WBC 9.6  NEUTROABS 6.4  HGB 13.7  HCT 42.2  MCV 82.6  PLT 859   Basic Metabolic Panel: Recent Labs  Lab 02/21/21 1715  NA 137  K 4.1  CL 103  CO2 29  GLUCOSE 80  BUN 27*  CREATININE 1.21  CALCIUM 9.2   GFR: CrCl cannot be calculated (Unknown ideal weight.). Liver Function Tests: No results for input(s): AST, ALT, ALKPHOS, BILITOT, PROT, ALBUMIN in the last 168 hours. No results for input(s): LIPASE, AMYLASE in the last 168 hours. No results for input(s): AMMONIA in the last 168 hours. Coagulation Profile: Recent Labs  Lab 02/21/21 1715  INR 1.1   Cardiac Enzymes: No results for input(s): CKTOTAL, CKMB, CKMBINDEX, TROPONINI in the last 168 hours. BNP (last 3 results) No results for input(s): PROBNP in the last 8760 hours. HbA1C: No results for input(s): HGBA1C in the last 72 hours. CBG: No results for input(s): GLUCAP in the last 168 hours. Lipid Profile: No results for input(s): CHOL, HDL,  LDLCALC, TRIG, CHOLHDL, LDLDIRECT in the last 72 hours. Thyroid Function Tests: No results for input(s): TSH, T4TOTAL, FREET4, T3FREE, THYROIDAB in the last 72 hours. Anemia Panel: No results for input(s): VITAMINB12, FOLATE, FERRITIN, TIBC, IRON, RETICCTPCT in the last 72 hours. Urine analysis:    Component Value Date/Time   COLORURINE YELLOW 04/01/2015 1942   APPEARANCEUR CLEAR 04/01/2015 1942   LABSPEC 1.021 04/01/2015 1942   PHURINE 6.0 04/01/2015 1942   GLUCOSEU NEGATIVE 04/01/2015 1942   HGBUR NEGATIVE 04/01/2015 1942   BILIRUBINUR NEGATIVE 04/01/2015 1942   KETONESUR NEGATIVE 04/01/2015 1942   PROTEINUR NEGATIVE 04/01/2015 1942   UROBILINOGEN 0.2 04/01/2015 1942   NITRITE NEGATIVE 04/01/2015 1942   LEUKOCYTESUR NEGATIVE 04/01/2015 1942   Sepsis Labs: @LABRCNTIP (procalcitonin:4,lacticidven:4) )No results found for this or any previous visit (from the past 240 hour(s)).   Radiological Exams on Admission: No results found.   Assessment/Plan   1. Rectal bleeding  - Patient with hx of diverticulosis and hemorrhoidectomy 2.5 mos ago presents with painless hematochezia, increasing over 4 days - He is hemodynamically stable and initial Hgb is normal  - Last dose of Eliquis was am of 7/13  - GI was consulted by ED physician  - Hold Eliquis, monitor H&H, follow-up GI recommendations    2. Paroxysmal atrial fibrillation  - Hold Eliquis initially in setting of GI bleeding, last dose was am of 7/13  - Continue metoprolol    3. Hypertension  - BP elevated in ED  - Continue metoprolol and ramipril    4. CAD - Hx of PCI in 2018  - No anginal complaints  - Continue statin, beta-blocker    DVT prophylaxis: SCDs Code Status: Full  Level of Care: Level of care: Med-Surg Family Communication: None present  Disposition Plan:  Patient is from: Home  Anticipated d/c is to: Home  Anticipated d/c date is: 7/14 or 02/23/21 Patient currently: Pending GI consultation  Consults  called: GI  Admission status: Observation     Vianne Bulls, MD [facility]  02/21/2021, 11:28 PM

## 2021-02-21 NOTE — ED Notes (Signed)
Hospital bed ordered.

## 2021-02-21 NOTE — ED Notes (Signed)
MS 

## 2021-02-21 NOTE — ED Triage Notes (Signed)
patient complains of frank rectal bleeding x 2 days continuously. Had intermittent bleeding the first 2 days. Patient taking eliquis. Alert and oriented, denies abdominal pain

## 2021-02-21 NOTE — ED Provider Notes (Signed)
Emergency Medicine Provider Triage Evaluation Note  Cody Hodge , a 75 y.o. male  was evaluated in triage.  Pt complains of presents with chief complaint of rectal bleed, going on for last several days, states he noticed bright red blood in his stool as well as dripping into his undergarments, states that he has a history of internal and external hemorrhoids, he has had a recent hemorrhoidectomy, is unsure if this is coming from that.  He has no history of GI bleeds, no history of diverticulitis, he is currently on Eliquis due to atrial fibrillation.  He denies chest pain, shortness of breath, feeling lightheaded or dizziness.  Recent play tennis today..  Review of Systems  Positive: Rectal bleed, hemorrhoids Negative: Shortness of breath or chest pain  Physical Exam  BP 139/72 (BP Location: Right Arm)   Pulse 73   Temp 98.1 F (36.7 C) (Oral)   Resp 17   SpO2 98%  Gen:   Awake, no distress   Resp:  Normal effort  MSK:   Moves extremities without difficulty  Other:    Medical Decision Making  Medically screening exam initiated at 5:16 PM.  Appropriate orders placed.  Cody Hodge was informed that the remainder of the evaluation will be completed by another provider, this initial triage assessment does not replace that evaluation, and the importance of remaining in the ED until their evaluation is complete.  Presents with rectal bleeding, lab work has been ordered, patient will require further evaluation.   Marcello Fennel, PA-C 02/21/21 [place], MD 02/21/21 1725

## 2021-02-21 NOTE — ED Provider Notes (Signed)
[facility] EMERGENCY DEPARTMENT Provider Note   CSN: [id number] Arrival date & time: 02/21/21  1624     History No chief complaint on file.   Cody Hodge is a 75 y.o. male.  Presents to ER with concern for bright red blood per rectum.  Patient reports that a couple months ago he had hemorrhoidectomy for internal hemorrhoids by a Education officer, environmental in [place].  Over the past couple days he has noted bright red blood coming from his rectum.  Occasionally has noted a couple of small clots.  No associated abdominal pain.  Otherwise feels well.  Played tennis this morning without difficulty.  Takes Eliquis for A. fib history.  HPI     Past Medical History:  Diagnosis Date   Arthritis    BPH (benign prostatic hyperplasia)    Coronary artery disease 09/2014   50% mid to distal LAD, 50-70% ostial first diag, 40% ostial and mid left circ.   ED (erectile dysfunction)    Hemorrhoids    associated with bowel movements   HTN (hypertension)    Hypercholesteremia    Knee arthropathy    Melanoma ([place])    Melanoma ([place])    Mixed hyperlipidemia    OSA (obstructive sleep apnea)    mild to moderate refused CPAP   PAF (paroxysmal atrial fibrillation) ([place])    CHADS VASC score 2 (age>65 and HTN)   Postoperative anemia due to acute blood loss 04/03/2015   Tremor     Patient Active Problem List   Diagnosis Date Noted   Rectal bleeding 02/21/2021   Angina pectoris ([place]) 03/16/2017   Acute blood loss anemia 04/11/2015   Constipation 04/11/2015   Status post total bilateral knee replacement    OA (osteoarthritis) of knee 03/29/2015   Dyspnea on exertion 09/26/2014   Family history of early CAD 09/26/2014   Coronary artery disease 09/2014   HTN (hypertension)    Mixed hyperlipidemia    OSA (obstructive sleep apnea)    PAF (paroxysmal atrial fibrillation) ([place])     Past Surgical History:  Procedure Laterality Date   CARDIAC CATHETERIZATION     2'16- only ever heart  cath   CATARACT EXTRACTION, BILATERAL     CORONARY STENT INTERVENTION N/A 03/18/2017   Procedure: CORONARY STENT INTERVENTION;  Surgeon: Nigel Mormon, MD;  Location: [place] [facility];  Service: Cardiovascular;  Laterality: N/A;   HEMORROIDECTOMY     HYDROCELE EXCISION / REPAIR     LASIK     left and right arthroscopy     left forearm melanoma     LEFT HEART CATH AND CORONARY ANGIOGRAPHY N/A 03/18/2017   Procedure: Left Heart Cath and Coronary Angiography;  Surgeon: Nigel Mormon, MD;  Location: [facility];  Service: Cardiovascular;  Laterality: N/A;   LEFT HEART CATHETERIZATION WITH CORONARY ANGIOGRAM N/A 10/06/2014   Procedure: LEFT HEART CATHETERIZATION WITH CORONARY ANGIOGRAM;  Surgeon: Sinclair Grooms, MD;  Location: [facility] CATH LAB;  Service: Cardiovascular;  Laterality: N/A;   TOTAL KNEE ARTHROPLASTY Bilateral 03/29/2015   Procedure: TOTAL KNEE BILATERAL;  Surgeon: Gaynelle Arabian, MD;  Location: [facility];  Service: Orthopedics;  Laterality: Bilateral;  spinal anes. placed in OR       Family History  Problem Relation Age of Onset   Dementia Mother    Heart attack Father    Heart disease Father    Alzheimer's disease Sister     Social History   Tobacco Use  Smoking status: Never   Smokeless tobacco: Never  Vaping Use   Vaping Use: Never used  Substance Use Topics   Alcohol use: Yes    Comment: 4 drinks per week   Drug use: No    Home Medications Prior to Admission medications   Medication Sig Start Date End Date Taking? Authorizing Provider  atorvastatin (LIPITOR) 20 MG tablet TAKE 1 TABLET BY MOUTH EVERY DAY IN THE MORNING Patient taking differently: Take 20 mg by mouth daily. TAKE 1 TABLET BY MOUTH EVERY DAY IN THE MORNING 11/23/20  Yes Adrian Prows, MD  cholecalciferol (VITAMIN D) 1000 units tablet Take 1,000 Units by mouth daily.   Yes [provider]  Coenzyme Q10 (CO Q-10) 200 MG CAPS Take 200 mg by mouth daily.   Yes [provider]  ELIQUIS 5 MG TABS tablet TAKE 1 TABLET TWICE A DAY Patient taking differently: Take 5 mg by mouth 2 (two) times daily. 02/02/21  Yes Adrian Prows, MD  ezetimibe (ZETIA) 10 MG tablet Take 1 tablet (10 mg total) by mouth daily. 11/20/20  Yes Adrian Prows, MD  ferrous sulfate 325 (65 FE) MG tablet Take 325 mg by mouth daily.   Yes [provider]  finasteride (PROSCAR) 5 MG tablet Take 5 mg by mouth daily.  05/18/13  Yes [provider]  ibuprofen (ADVIL,MOTRIN) 200 MG tablet Take 400 mg by mouth 2 (two) times daily as needed for moderate pain.   Yes [provider]  metoprolol succinate (TOPROL-XL) 25 MG 24 hr tablet Take 1 tablet (25 mg total) by mouth daily. Please schedule appointment for refills 11/20/20  Yes Adrian Prows, MD  Multiple Vitamin (MULTIVITAMIN WITH MINERALS) TABS tablet Take 1 tablet by mouth daily.   Yes [provider]  OVER THE COUNTER MEDICATION Take 2 capsules by mouth every morning. elysium   Yes [provider]  Polyethyl Glycol-Propyl Glycol (SYSTANE OP) Place 1 drop into both eyes 2 (two) times daily.    Yes [provider]  ramipril (ALTACE) 5 MG capsule Take 1 capsule (5 mg total) by mouth daily. 11/30/20  Yes Adrian Prows, MD  Resveratrol 250 MG CAPS Take 250 mg by mouth 2 (two) times daily.   Yes [provider]  triamcinolone cream (KENALOG) 0.1 % Apply 1 application topically as needed (rash).   Yes [provider]  TURMERIC PO Take 1 capsule by mouth 2 (two) times daily.    Yes [provider]    Allergies    Patient has no known allergies.  Review of Systems   Review of Systems  Constitutional:  Negative for chills and fever.  HENT:  Negative for ear pain and sore throat.   Eyes:  Negative for pain and visual disturbance.  Respiratory:  Negative for cough and shortness of breath.   Cardiovascular:  Negative for chest pain and palpitations.  Gastrointestinal:  Positive for blood in  stool. Negative for abdominal pain and vomiting.  Genitourinary:  Negative for dysuria and hematuria.  Musculoskeletal:  Negative for arthralgias and back pain.  Skin:  Negative for color change and rash.  Neurological:  Negative for seizures and syncope.  All other systems reviewed and are negative.  Physical Exam Updated Vital Signs BP (!) 144/76   Pulse 65   Temp 98.1 F (36.7 C) (Oral)   Resp 16   SpO2 98%   Physical Exam Vitals and nursing note reviewed. Exam conducted with a chaperone present.  Constitutional:  Appearance: He is well-developed.  HENT:     Head: Normocephalic and atraumatic.  Eyes:     Conjunctiva/sclera: Conjunctivae normal.  Cardiovascular:     Rate and Rhythm: Normal rate and regular rhythm.     Heart sounds: No murmur heard. Pulmonary:     Effort: Pulmonary effort is normal. No respiratory distress.     Breath sounds: Normal breath sounds.  Abdominal:     Palpations: Abdomen is soft.     Tenderness: There is no abdominal tenderness.  Genitourinary:    Comments: external hemorrhoid noted, no active bleeding, small dried blood around rectum; no obvious internal hemorrhoids on digital rectal exam Musculoskeletal:     Cervical back: Neck supple.  Skin:    General: Skin is warm and dry.  Neurological:     Mental Status: He is alert.    ED Results / Procedures / Treatments   Labs (all labs ordered are listed, but only abnormal results are displayed) Labs Reviewed  BASIC METABOLIC PANEL - Abnormal; Notable for the following components:      Result Value   BUN 27 (*)    All other components within normal limits  POC OCCULT BLOOD, ED - Abnormal; Notable for the following components:   Fecal Occult Bld POSITIVE (*)    All other components within normal limits  SARS CORONAVIRUS 2 (TAT 6-24 HRS)  CBC WITH DIFFERENTIAL/PLATELET  PROTIME-INR  CBC  BASIC METABOLIC PANEL  CBC  TYPE AND SCREEN    EKG None  Radiology No results  found.  Procedures Procedures   Medications Ordered in ED Medications  atorvastatin (LIPITOR) tablet 20 mg (has no administration in time range)  metoprolol succinate (TOPROL-XL) 24 hr tablet 25 mg (has no administration in time range)  ramipril (ALTACE) capsule 5 mg (has no administration in time range)  finasteride (PROSCAR) tablet 5 mg (has no administration in time range)  acetaminophen (TYLENOL) tablet 650 mg (has no administration in time range)    Or  acetaminophen (TYLENOL) suppository 650 mg (has no administration in time range)  ondansetron (ZOFRAN) tablet 4 mg (has no administration in time range)    Or  ondansetron (ZOFRAN) injection 4 mg (has no administration in time range)    ED Course  I have reviewed the triage vital signs and the nursing notes.  Pertinent labs & imaging results that were available during my care of the patient were reviewed by me and considered in my medical decision making (see chart for details).    MDM Rules/Calculators/A&P                          75 year old male presented to ER with concern for bright red blood per rectum.  On Eliquis for A. fib.  Hemoglobin stable.  Noted small blood around rectum.  No active bleeding at present.  Vital stable.  Discussed with Dr. Paulita Fujita.  He felt either admission for expedited eval by GI and likely colonoscopy or close outpatient management would be reasonable.  Requested both options be outpatient.  Patient would like to be admitted to expedite this work-up.  Discussed case with Dr. Myna Hidalgo with [facility].  Sent secure chat to Dr. Paulita Fujita, anticipate GI to see in the morning.  Final Clinical Impression(s) / ED Diagnoses Final diagnoses:  Gastrointestinal hemorrhage, unspecified gastrointestinal hemorrhage type  Bright red blood per rectum  Current use of long term anticoagulation    Rx / DC Orders ED Discharge Orders  None        Lucrezia Starch, MD 02/21/21 2340

## 2021-02-22 DIAGNOSIS — K644 Residual hemorrhoidal skin tags: Secondary | ICD-10-CM | POA: Diagnosis present

## 2021-02-22 DIAGNOSIS — K648 Other hemorrhoids: Secondary | ICD-10-CM | POA: Diagnosis present

## 2021-02-22 DIAGNOSIS — K5731 Diverticulosis of large intestine without perforation or abscess with bleeding: Secondary | ICD-10-CM | POA: Diagnosis present

## 2021-02-22 DIAGNOSIS — I480 Paroxysmal atrial fibrillation: Secondary | ICD-10-CM | POA: Diagnosis present

## 2021-02-22 DIAGNOSIS — K625 Hemorrhage of anus and rectum: Secondary | ICD-10-CM | POA: Diagnosis not present

## 2021-02-22 DIAGNOSIS — Z20822 Contact with and (suspected) exposure to covid-19: Secondary | ICD-10-CM | POA: Diagnosis present

## 2021-02-22 DIAGNOSIS — Z7901 Long term (current) use of anticoagulants: Secondary | ICD-10-CM | POA: Diagnosis not present

## 2021-02-22 DIAGNOSIS — E7800 Pure hypercholesterolemia, unspecified: Secondary | ICD-10-CM | POA: Diagnosis present

## 2021-02-22 DIAGNOSIS — M1990 Unspecified osteoarthritis, unspecified site: Secondary | ICD-10-CM | POA: Diagnosis present

## 2021-02-22 DIAGNOSIS — G4733 Obstructive sleep apnea (adult) (pediatric): Secondary | ICD-10-CM | POA: Diagnosis present

## 2021-02-22 DIAGNOSIS — Z79899 Other long term (current) drug therapy: Secondary | ICD-10-CM | POA: Diagnosis not present

## 2021-02-22 DIAGNOSIS — K921 Melena: Secondary | ICD-10-CM | POA: Diagnosis not present

## 2021-02-22 DIAGNOSIS — I2510 Atherosclerotic heart disease of native coronary artery without angina pectoris: Secondary | ICD-10-CM | POA: Diagnosis present

## 2021-02-22 DIAGNOSIS — I10 Essential (primary) hypertension: Secondary | ICD-10-CM | POA: Diagnosis present

## 2021-02-22 DIAGNOSIS — N400 Enlarged prostate without lower urinary tract symptoms: Secondary | ICD-10-CM | POA: Diagnosis present

## 2021-02-22 DIAGNOSIS — I739 Peripheral vascular disease, unspecified: Secondary | ICD-10-CM | POA: Diagnosis present

## 2021-02-22 DIAGNOSIS — K5730 Diverticulosis of large intestine without perforation or abscess without bleeding: Secondary | ICD-10-CM | POA: Diagnosis not present

## 2021-02-22 DIAGNOSIS — Z85820 Personal history of malignant melanoma of skin: Secondary | ICD-10-CM | POA: Diagnosis not present

## 2021-02-22 DIAGNOSIS — Z8249 Family history of ischemic heart disease and other diseases of the circulatory system: Secondary | ICD-10-CM | POA: Diagnosis not present

## 2021-02-22 DIAGNOSIS — Z96653 Presence of artificial knee joint, bilateral: Secondary | ICD-10-CM | POA: Diagnosis present

## 2021-02-22 DIAGNOSIS — K649 Unspecified hemorrhoids: Secondary | ICD-10-CM | POA: Diagnosis not present

## 2021-02-22 DIAGNOSIS — E782 Mixed hyperlipidemia: Secondary | ICD-10-CM | POA: Diagnosis present

## 2021-02-22 DIAGNOSIS — Z820 Family history of epilepsy and other diseases of the nervous system: Secondary | ICD-10-CM | POA: Diagnosis not present

## 2021-02-22 LAB — BASIC METABOLIC PANEL
Anion gap: 6 (ref 5–15)
BUN: 24 mg/dL — ABNORMAL HIGH (ref 8–23)
CO2: 27 mmol/L (ref 22–32)
Calcium: 8.9 mg/dL (ref 8.9–10.3)
Chloride: 104 mmol/L (ref 98–111)
Creatinine, Ser: 1.06 mg/dL (ref 0.61–1.24)
GFR, Estimated: >60 mL/min (ref >60)
Glucose, Bld: 96 mg/dL (ref 70–99)
Potassium: 4.1 mmol/L (ref 3.5–5.1)
Sodium: 137 mmol/L (ref 135–145)

## 2021-02-22 LAB — CBC
HCT: 41.6 % (ref 39.0–52.0)
Hemoglobin: 13.3 g/dL (ref 13.0–17.0)
MCH: 26.2 pg (ref 26.0–34.0)
MCHC: 32 g/dL (ref 30.0–36.0)
MCV: 81.9 fL (ref 80.0–100.0)
Platelets: 290 10*3/uL (ref 150–400)
RBC: 5.08 MIL/uL (ref 4.22–5.81)
RDW: 14.8 % (ref 11.5–15.5)
WBC: 8.3 10*3/uL (ref 4.0–10.5)
nRBC: 0 % (ref 0.0–0.2)

## 2021-02-22 LAB — SARS CORONAVIRUS 2 (TAT 6-24 HRS): SARS Coronavirus 2: NEGATIVE

## 2021-02-22 MED ORDER — PEG 3350-KCL-NA BICARB-NACL 420 G PO SOLR
4000.0000 mL | Freq: Once | ORAL | Status: AC
  Administered 2021-02-22: 4000 mL via ORAL
  Filled 2021-02-22 (×2): qty 4000

## 2021-02-22 MED ORDER — DEXTROSE-NACL 5-0.45 % IV SOLN: INTRAVENOUS | Status: DC

## 2021-02-22 NOTE — ED Notes (Signed)
Attempted to call report to 5N. RN unable to take report at this time. Number given for call back.

## 2021-02-22 NOTE — Plan of Care (Signed)

## 2021-02-22 NOTE — Plan of Care (Signed)
  Problem: Education: Goal: Knowledge of General Education information will improve Description: Including pain rating scale, medication(s)/side effects and non-pharmacologic comfort measures Outcome: Progressing   Problem: Safety: Goal: Ability to remain free from injury will improve Outcome: Progressing   

## 2021-02-22 NOTE — Plan of Care (Signed)
  Problem: Pain Managment: Goal: General experience of comfort will improve Outcome: Progressing   Problem: Safety: Goal: Ability to remain free from injury will improve Outcome: Progressing   Problem: Skin Integrity: Goal: Risk for impaired skin integrity will decrease Outcome: Progressing   

## 2021-02-22 NOTE — Consult Note (Signed)
Referring Provider: Dr. Madalyn Rob Primary Care Physician:  Alroy Dust, Carlean Jews.Marlou Sa, MD Primary Gastroenterologist:  Dr. Watt Climes [facility])  Reason for Consultation:  Rectal bleeding  HPI: Cody Hodge is a 75 y.o. male with history of internal hemorrhoids (s/p hemorrhoidectomy ~2.5 months ago), CAD, A fib (on Eliquis), and HTN presenting for consultation of rectal bleeding.  Patient states that over the last 5 days, he has had intermittent rectal bleeding.  He states that when he sits down on the toilet, he passes a stream of red blood for 15 to 20 seconds at a time.  He also passed a large blood clot yesterday.  Denies any rectal discomfort or abdominal pain.  He states he has had diarrhea for approximately 1 month, which is new for him.  Denies any recently hard stools, though did have a more formed stool yesterday.  Reports approximately 10 pound weight loss in the last 2 to 3 months.  Denies changes in appetite, nausea, vomiting, dysphagia.  He is on Eliquis, last dose 7/13 AM.  Denies aspirin or NSAID use.  Denies family history of colon cancer or gastrointestinal malignancy.  Last colonoscopy 12/2012: bleeding external and internal hemorrhoids, diverticulosis, and melanotic mucosa  Past Medical History:  Diagnosis Date   Arthritis    BPH (benign prostatic hyperplasia)    Coronary artery disease 09/2014   50% mid to distal LAD, 50-70% ostial first diag, 40% ostial and mid left circ.   ED (erectile dysfunction)    Hemorrhoids    associated with bowel movements   HTN (hypertension)    Hypercholesteremia    Knee arthropathy    Melanoma (HCC)    Melanoma (HCC)    Mixed hyperlipidemia    OSA (obstructive sleep apnea)    mild to moderate refused CPAP   PAF (paroxysmal atrial fibrillation) (HCC)    CHADS VASC score 2 (age>65 and HTN)   Postoperative anemia due to acute blood loss 04/03/2015   Tremor     Past Surgical History:  Procedure Laterality Date   CARDIAC CATHETERIZATION      2'16- only ever heart cath   CATARACT EXTRACTION, BILATERAL     CORONARY STENT INTERVENTION N/A 03/18/2017   Procedure: CORONARY STENT INTERVENTION;  Surgeon: Nigel Mormon, MD;  Location: [place] [facility];  Service: Cardiovascular;  Laterality: N/A;   HEMORROIDECTOMY     HYDROCELE EXCISION / REPAIR     LASIK     left and right arthroscopy     left forearm melanoma     LEFT HEART CATH AND CORONARY ANGIOGRAPHY N/A 03/18/2017   Procedure: Left Heart Cath and Coronary Angiography;  Surgeon: Nigel Mormon, MD;  Location: [place] [facility];  Service: Cardiovascular;  Laterality: N/A;   LEFT HEART CATHETERIZATION WITH CORONARY ANGIOGRAM N/A 10/06/2014   Procedure: LEFT HEART CATHETERIZATION WITH CORONARY ANGIOGRAM;  Surgeon: Sinclair Grooms, MD;  Location: [facility];  Service: Cardiovascular;  Laterality: N/A;   TOTAL KNEE ARTHROPLASTY Bilateral 03/29/2015   Procedure: TOTAL KNEE BILATERAL;  Surgeon: Gaynelle Arabian, MD;  Location: [facility];  Service: Orthopedics;  Laterality: Bilateral;  spinal anes. placed in OR    Prior to Admission medications   Medication Sig Start Date End Date Taking? Authorizing Provider  atorvastatin (LIPITOR) 20 MG tablet TAKE 1 TABLET BY MOUTH EVERY DAY IN THE MORNING Patient taking differently: Take 20 mg by mouth daily. TAKE 1 TABLET BY MOUTH EVERY DAY IN THE MORNING 11/23/20  Yes Adrian Prows, MD  cholecalciferol (VITAMIN D) 1000 units tablet Take 1,000 Units by mouth daily.   Yes [provider]  Coenzyme Q10 (CO Q-10) 200 MG CAPS Take 200 mg by mouth daily.   Yes [provider]  ELIQUIS 5 MG TABS tablet TAKE 1 TABLET TWICE A DAY Patient taking differently: Take 5 mg by mouth 2 (two) times daily. 02/02/21  Yes Adrian Prows, MD  ezetimibe (ZETIA) 10 MG tablet Take 1 tablet (10 mg total) by mouth daily. 11/20/20  Yes Adrian Prows, MD  ferrous sulfate 325 (65 FE) MG tablet Take 325 mg by mouth daily.   Yes [provider]   finasteride (PROSCAR) 5 MG tablet Take 5 mg by mouth daily.  05/18/13  Yes [provider]  ibuprofen (ADVIL,MOTRIN) 200 MG tablet Take 400 mg by mouth 2 (two) times daily as needed for moderate pain.   Yes [provider]  metoprolol succinate (TOPROL-XL) 25 MG 24 hr tablet Take 1 tablet (25 mg total) by mouth daily. Please schedule appointment for refills 11/20/20  Yes Adrian Prows, MD  Multiple Vitamin (MULTIVITAMIN WITH MINERALS) TABS tablet Take 1 tablet by mouth daily.   Yes [provider]  OVER THE COUNTER MEDICATION Take 2 capsules by mouth every morning. elysium   Yes [provider]  Polyethyl Glycol-Propyl Glycol (SYSTANE OP) Place 1 drop into both eyes 2 (two) times daily.    Yes [provider]  ramipril (ALTACE) 5 MG capsule Take 1 capsule (5 mg total) by mouth daily. 11/30/20  Yes Adrian Prows, MD  Resveratrol 250 MG CAPS Take 250 mg by mouth 2 (two) times daily.   Yes [provider]  triamcinolone cream (KENALOG) 0.1 % Apply 1 application topically as needed (rash).   Yes [provider]  TURMERIC PO Take 1 capsule by mouth 2 (two) times daily.    Yes [provider]    Scheduled Meds:  atorvastatin  20 mg Oral Daily   finasteride  5 mg Oral Daily   metoprolol succinate  25 mg Oral Daily   ramipril  5 mg Oral Daily   Continuous Infusions: PRN Meds:.acetaminophen **OR** acetaminophen, ondansetron **OR** ondansetron (ZOFRAN) IV  Allergies as of 02/21/2021   (No Known Allergies)    Family History  Problem Relation Age of Onset   Dementia Mother    Heart attack Father    Heart disease Father    Alzheimer's disease Sister     Social History   Socioeconomic History   Marital status: Married    Spouse name: Not on file   Number of children: 3   Years of education: Not on file   Highest education level: Not on file  Occupational History   Not on file  Tobacco Use   Smoking status: Never    Smokeless tobacco: Never  Vaping Use   Vaping Use: Never used  Substance and Sexual Activity   Alcohol use: Yes    Comment: 4 drinks per week   Drug use: No   Sexual activity: Not on file  Other Topics Concern   Not on file  Social History Narrative   Not on file   Social Determinants of Health   Financial Resource Strain: Not on file  Food Insecurity: Not on file  Transportation Needs: Not on file  Physical Activity: Not on file  Stress: Not on file  Social Connections: Not on file  Intimate Partner Violence: Not on file    Review of Systems: Review of  Systems  Constitutional:  Positive for weight loss. Negative for fever.  HENT:  Negative for sore throat.   Eyes:  Negative for pain and redness.  Respiratory:  Negative for cough, shortness of breath and stridor.   Cardiovascular:  Negative for chest pain and palpitations.  Gastrointestinal:  Positive for blood in stool and diarrhea. Negative for abdominal pain, constipation, heartburn, melena, nausea and vomiting.  Genitourinary:  Negative for flank pain and hematuria.  Musculoskeletal:  Negative for falls and joint pain.  Skin:  Negative for itching and rash.  Neurological:  Negative for seizures and loss of consciousness.  Endo/Heme/Allergies:  Negative for polydipsia. Does not bruise/bleed easily.  Psychiatric/Behavioral:  Negative for hallucinations. The patient is not nervous/anxious.     Physical Exam: Vital signs: Vitals:   02/22/21 0130 02/22/21 0836  BP: (!) 150/75 (!) 141/77  Pulse: 70 65  Resp: (!) 23 16  Temp:  97.8 F (36.6 C)  SpO2: 95% 98%   Last BM Date: 02/21/21  Physical Exam Vitals reviewed.  Constitutional:      General: He is not in acute distress.    Appearance: He is normal weight.  HENT:     Head: Normocephalic and atraumatic.     Nose: Nose normal. No congestion.     Mouth/Throat:     Mouth: Mucous membranes are moist.     Pharynx: Oropharynx is clear.  Eyes:     Extraocular  Movements: Extraocular movements intact.     Conjunctiva/sclera: Conjunctivae normal.  Cardiovascular:     Rate and Rhythm: Normal rate and regular rhythm.  Pulmonary:     Effort: Pulmonary effort is normal. No respiratory distress.  Abdominal:     General: Bowel sounds are normal. There is no distension.     Palpations: Abdomen is soft. There is no mass.     Tenderness: There is no abdominal tenderness. There is no guarding or rebound.     Hernia: No hernia is present.  Musculoskeletal:        General: No swelling or tenderness.     Cervical back: Normal range of motion and neck supple.  Skin:    General: Skin is warm and dry.  Neurological:     General: No focal deficit present.     Mental Status: He is alert and oriented to person, place, and time.  Psychiatric:        Mood and Affect: Mood normal.        Behavior: Behavior normal. Behavior is cooperative.     GI:  Lab Results: Recent Labs    02/21/21 1715 02/21/21 2307  WBC 9.6 9.0  HGB 13.7 13.3  HCT 42.2 41.1  PLT 313 279   BMET Recent Labs    02/21/21 1715 02/22/21 0225  NA 137 137  K 4.1 4.1  CL 103 104  CO2 29 27  GLUCOSE 80 96  BUN 27* 24*  CREATININE 1.21 1.06  CALCIUM 9.2 8.9   LFT No results for input(s): PROT, ALBUMIN, AST, ALT, ALKPHOS, BILITOT, BILIDIR, IBILI in the last 72 hours. PT/INR Recent Labs    02/21/21 1715  LABPROT 14.0  INR 1.1     Studies/Results: No results found.  Impression: Rectal bleeding: most likely hemorrhoidal, given stable Hgb.  Diverticular bleeding and malignancy also on the differential, though less likely. -Hgb 13.3 yesterday, stable as compared to Hgb 12.9 as of 12/2019. Today's Hgb pending -Normal INR (1.1) as of 02/21/10 -Mildly elevated BUN (24) with normal Cr (  1.06), though presentation not consistent with upper GI bleeding -History of internal hemorrhoidectomy ~2.5 months ago  A fib, on Eliquis, last dose 7/13 AM  Plan: Colonoscopy tomorrow for  further evaluation.  I thoroughly discussed the procedure with the patient to include nature, alternatives, benefits, and risks (including but not limited to bleeding, infection, perforation, anesthesia/cardiac pulmonary complications).  Patient verbalized understanding and gave verbal consent to proceed with colonoscopy.  Clear liquid diet, NuLytely prep, NPO at midnight.  Continue to monitor CBC daily.  [facility] will follow.   LOS: 0 days   Salley Slaughter  PA-C 02/22/2021, 9:00 AM  Contact #  [phone]

## 2021-02-22 NOTE — Progress Notes (Signed)
PROGRESS NOTE    Cody Hodge  TIW:[id number] DOB: 08-Jul-1946 DOA: 02/21/2021 PCP: Alroy Dust, L.Marlou Sa, MD    Brief Narrative:  Cody Hodge is a 75 y.o. male with medical history significant for asymptomatic carotid artery stenosis, CAD, hyperlipidemia, hypertension, and paroxysmal atrial fibrillation on Eliquis, now presenting to the emergency department for evaluation of rectal bleeding.  The patient reports painless passage of bright red blood per rectum beginning 4 days ago and increasing in frequency and volume over the past 1 to 2 days.  He has had similar episodes in the past that were attributed to internal hemorrhoids and he underwent hemorrhoidectomy in [place] 2.5 months ago.  Current symptoms involve sensation of needing to defecate followed by passage of bright red blood and clots.  He denies any abdominal pain, nausea, or vomiting.  He takes occasional NSAIDs but not recently.  Last dose of Eliquis was the morning of 02/21/2021.  He had a colonoscopy in 2004 with diverticulosis and internal.  Fecal occult blood testing positive.  Gastroenterology was consulted by the ED physician and hospitalists asked to admit.  7/14- no blood in stool this am. After discussion pt reports taking Ibuprofen for pain as he plays tennis daily   Consultants:  GI  Procedures:   Antimicrobials:     Subjective: Denies shortness of breath, chest pain, or dizziness  Objective: Vitals:   02/22/21 0045 02/22/21 0130 02/22/21 0238 02/22/21 0836  BP: 132/70 (!) 150/75  (!) 141/77  Pulse: 63 70  65  Resp: 15 (!) 23  16  Temp:    97.8 F (36.6 C)  TempSrc:    Oral  SpO2: 96% 95%  98%  Weight:   86.1 kg   Height:   5\' 10"  (1.778 m)    No intake or output data in the 24 hours ending 02/22/21 1030 Filed Weights   02/22/21 0238  Weight: 86.1 kg    Examination:  General exam: Appears calm and comfortable  Respiratory system: Clear to auscultation. Respiratory effort  normal. Cardiovascular system: S1 & S2 heard, RRR. No gallop  Gastrointestinal system: Abdomen is nondistended, soft and nontender.  Normal bowel sounds heard. Central nervous system: Alert and oriented.  Grossly intact Extremities: No edema Skin: Warm dry Psychiatry: Judgement and insight appear normal. Mood & affect appropriate.     Data Reviewed: I have personally reviewed following labs and imaging studies  CBC: Recent Labs  Lab 02/21/21 1715 02/21/21 2307  WBC 9.6 9.0  NEUTROABS 6.4  --   HGB 13.7 13.3  HCT 42.2 41.1  MCV 82.6 82.5  PLT 313 250   Basic Metabolic Panel: Recent Labs  Lab 02/21/21 1715 02/22/21 0225  NA 137 137  K 4.1 4.1  CL 103 104  CO2 29 27  GLUCOSE 80 96  BUN 27* 24*  CREATININE 1.21 1.06  CALCIUM 9.2 8.9   GFR: Estimated Creatinine Clearance: 62.2 mL/min (by C-G formula based on SCr of 1.06 mg/dL). Liver Function Tests: No results for input(s): AST, ALT, ALKPHOS, BILITOT, PROT, ALBUMIN in the last 168 hours. No results for input(s): LIPASE, AMYLASE in the last 168 hours. No results for input(s): AMMONIA in the last 168 hours. Coagulation Profile: Recent Labs  Lab 02/21/21 1715  INR 1.1   Cardiac Enzymes: No results for input(s): CKTOTAL, CKMB, CKMBINDEX, TROPONINI in the last 168 hours. BNP (last 3 results) No results for input(s): PROBNP in the last 8760 hours. HbA1C: No results for input(s): HGBA1C in the last 72 hours.  CBG: No results for input(s): GLUCAP in the last 168 hours. Lipid Profile: No results for input(s): CHOL, HDL, LDLCALC, TRIG, CHOLHDL, LDLDIRECT in the last 72 hours. Thyroid Function Tests: No results for input(s): TSH, T4TOTAL, FREET4, T3FREE, THYROIDAB in the last 72 hours. Anemia Panel: No results for input(s): VITAMINB12, FOLATE, FERRITIN, TIBC, IRON, RETICCTPCT in the last 72 hours. Sepsis Labs: No results for input(s): PROCALCITON, LATICACIDVEN in the last 168 hours.  Recent Results (from the past 240  hour(s))  SARS CORONAVIRUS 2 (TAT 6-24 HRS) Nasopharyngeal Nasopharyngeal Swab     Status: None   Collection Time: 02/21/21 11:36 PM   Specimen: Nasopharyngeal Swab  Result Value Ref Range Status   SARS Coronavirus 2 NEGATIVE NEGATIVE Final    Comment: (NOTE) SARS-CoV-2 target nucleic acids are NOT DETECTED.  The SARS-CoV-2 RNA is generally detectable in upper and lower respiratory specimens during the acute phase of infection. Negative results do not preclude SARS-CoV-2 infection, do not rule out co-infections with other pathogens, and should not be used as the sole basis for treatment or other patient management decisions. Negative results must be combined with clinical observations, patient history, and epidemiological information. The expected result is Negative.  Fact Sheet for Patients: SugarRoll.be  Fact Sheet for Healthcare Providers: https://www.woods-mathews.com/  This test is not yet approved or cleared by the [place] FDA and  has been authorized for detection and/or diagnosis of SARS-CoV-2 by FDA under an Emergency Use Authorization (EUA). This EUA will remain  in effect (meaning this test can be used) for the duration of the COVID-19 declaration under Se ction 564(b)(1) of the Act, 21 U.S.C. section 360bbb-3(b)(1), unless the authorization is terminated or revoked sooner.  Performed at [facility], [place] [address], [place], [place] [address]          Radiology Studies: No results found.      Scheduled Meds:  atorvastatin  20 mg Oral Daily   finasteride  5 mg Oral Daily   metoprolol succinate  25 mg Oral Daily   ramipril  5 mg Oral Daily   Continuous Infusions:  dextrose 5 % and 0.45% NaCl 50 mL/hr at 02/22/21 1029    Assessment & Plan:   Principal Problem:   Rectal bleeding Active Problems:   HTN (hypertension)   PAF (paroxysmal atrial fibrillation) (HCC)   Coronary artery disease   1.  Rectal bleeding  - Patient with hx of diverticulosis and hemorrhoidectomy 2.5 mos ago presents with painless hematochezia, increasing over 4 days H&H stable.  Last dose of Eliquis 7/13 GI consulted by ED physician, will follow-up Patient does report taking ibuprofen on regular basis for pain since he plays tennis daily.  Did tell patient to avoid NSAIDs with being on Eliquis Currently n.p.o. IV fluids to start    2. Paroxysmal atrial fibrillation  Holding Eliquis as above Continue metoprolol   3. Hypertension  Continue metoprolol and ramipril   4. CAD -History of PCI in 2018  No chest pain or anginal complaints  Continue statins and beta-blockers    DVT prophylaxis: SCD Code Status: Full Family Communication: None at bedside Disposition Plan:  Status is: Observation  The patient remains OBS appropriate and will d/c before 2 midnights.  Dispo: The patient is from: Home              Anticipated d/c is to: Home              Patient currently is not medically stable to  d/c.   Difficult to place patient No            LOS: 0 days   Time spent: 45 minutes with more than 50% on [place], MD [facility] Pager [phone]  If 7PM-7AM, please contact night-coverage 02/22/2021, 10:30 AM

## 2021-02-23 ENCOUNTER — Encounter (HOSPITAL_COMMUNITY)
Admission: EM | Disposition: A | Discharge status: Home / Self Care | Payer: Self-pay | Source: Home / Self Care | Attending: Internal Medicine

## 2021-02-23 ENCOUNTER — Encounter (HOSPITAL_COMMUNITY): Payer: Self-pay | Admitting: Internal Medicine

## 2021-02-23 ENCOUNTER — Inpatient Hospital Stay (HOSPITAL_COMMUNITY): Payer: Medicare Other | Admitting: Anesthesiology

## 2021-02-23 DIAGNOSIS — K625 Hemorrhage of anus and rectum: Secondary | ICD-10-CM | POA: Diagnosis not present

## 2021-02-23 HISTORY — PX: COLONOSCOPY: SHX5424

## 2021-02-23 LAB — CBC
HCT: 42.6 % (ref 39.0–52.0)
Hemoglobin: 13.8 g/dL (ref 13.0–17.0)
MCH: 26.7 pg (ref 26.0–34.0)
MCHC: 32.4 g/dL (ref 30.0–36.0)
MCV: 82.4 fL (ref 80.0–100.0)
Platelets: 293 10*3/uL (ref 150–400)
RBC: 5.17 MIL/uL (ref 4.22–5.81)
RDW: 15 % (ref 11.5–15.5)
WBC: 7.9 10*3/uL (ref 4.0–10.5)
nRBC: 0 % (ref 0.0–0.2)

## 2021-02-23 SURGERY — COLONOSCOPY
Anesthesia: Monitor Anesthesia Care

## 2021-02-23 MED ORDER — LACTATED RINGERS IV SOLN: INTRAVENOUS | Status: DC | PRN

## 2021-02-23 MED ORDER — SODIUM CHLORIDE 0.9 % IV SOLN: INTRAVENOUS | Status: DC

## 2021-02-23 MED ORDER — PROPOFOL 500 MG/50ML IV EMUL
INTRAVENOUS | Status: DC | PRN
  Administered 2021-02-23: 125 ug/kg/min via INTRAVENOUS

## 2021-02-23 MED ORDER — LACTATED RINGERS IV SOLN: INTRAVENOUS | Status: DC

## 2021-02-23 MED ORDER — PHENYLEPHRINE HCL (PRESSORS) 10 MG/ML IV SOLN
INTRAVENOUS | Status: DC | PRN
  Administered 2021-02-23: 80 ug via INTRAVENOUS

## 2021-02-23 MED ORDER — PROPOFOL 10 MG/ML IV BOLUS
INTRAVENOUS | Status: DC | PRN
  Administered 2021-02-23 (×2): 10 mg via INTRAVENOUS

## 2021-02-23 NOTE — Discharge Summary (Signed)
Physician Discharge Summary  Cody Hodge YQM:[id number] DOB: Jan 11, 1946 DOA: 02/21/2021  PCP: Alroy Dust, L.Marlou Sa, MD  Admit date: 02/21/2021 Discharge date: 02/23/2021  Admitted From: Home  Disposition:  Home   Recommendations for Outpatient Follow-up:  Follow up with GI as directed     Home Health: None  Equipment/Devices: None new  Discharge Condition: Good  CODE STATUS: FULL Diet recommendation: Regular  Brief/Interim Summary: Cody Hodge is a 75 y.o. M with pAF on Eliquis, HTN, and CAD who presented with rectal bleeding.  The patient had had excessive internal hemorrhoidal bleeding in the past given Eliquis, had recently had hemorrhoidectomy in [place] 2 months ago.  Since the surgery, he had healed as expected, post-op pain and bleeding had resolved and then a few days prior to admission, he had fairly substantial rectal bleeding.       PRINCIPAL HOSPITAL DIAGNOSIS: Rectal bleeding    Discharge Diagnoses:  Diverticular hemorrhage vs hemorrhoidal bleeding The patient was admitted and observed overnight.  He had no further rectal bleeding.  Hemoglobin stayed stable at 13 g/dL.  Gastroenterology was consulted, he underwent bowel prep, and colonoscopy in the morning.  Colonoscopy showed some small residual internal hemorrhoids, numerous colonic diverticula, but no evidence of blood, no stigmata of recent bleeding.  Source was thought to be either diverticular or internal hemorrhoids.  Of note, his postsurgical site appeared unremarkable and intact.   Paroxysmal atrial fibrillation Restart Eliquis in 3 days  Coronary disease Hypertension Peripheral vascular disease         Discharge Instructions  Discharge Instructions     Discharge instructions   Complete by: As directed    From Dr. Loleta Books: You were admitted for rectal bleeding.    Your endoscopy here showed the external hemorrhoid, a few residual internal hemorrhoids, and some diverticuli (these are  common structures found in the colon, which can also bleed).  Resume your blood thinner in 3 days, on Jul 18  Go see Dr. Watt Climes in the office   Increase activity slowly   Complete by: As directed       Allergies as of 02/23/2021   No Known Allergies      Medication List     TAKE these medications    atorvastatin 20 MG tablet Commonly known as: LIPITOR TAKE 1 TABLET BY MOUTH EVERY DAY IN THE MORNING What changed:  how much to take how to take this when to take this   cholecalciferol 1000 units tablet Commonly known as: VITAMIN D Take 1,000 Units by mouth daily.   Co Q-10 200 MG Caps Take 200 mg by mouth daily.   Eliquis 5 MG Tabs tablet Generic drug: apixaban TAKE 1 TABLET TWICE A DAY What changed: how much to take   ezetimibe 10 MG tablet Commonly known as: ZETIA Take 1 tablet (10 mg total) by mouth daily.   ferrous sulfate 325 (65 FE) MG tablet Take 325 mg by mouth daily.   finasteride 5 MG tablet Commonly known as: PROSCAR Take 5 mg by mouth daily.   ibuprofen 200 MG tablet Commonly known as: ADVIL Take 400 mg by mouth 2 (two) times daily as needed for moderate pain.   metoprolol succinate 25 MG 24 hr tablet Commonly known as: TOPROL-XL Take 1 tablet (25 mg total) by mouth daily. Please schedule appointment for refills   multivitamin with minerals Tabs tablet Take 1 tablet by mouth daily.   OVER THE COUNTER MEDICATION Take 2 capsules by mouth every morning. elysium  ramipril 5 MG capsule Commonly known as: ALTACE Take 1 capsule (5 mg total) by mouth daily.   Resveratrol 250 MG Caps Take 250 mg by mouth 2 (two) times daily.   SYSTANE OP Place 1 drop into both eyes 2 (two) times daily.   triamcinolone cream 0.1 % Commonly known as: KENALOG Apply 1 application topically as needed (rash).   TURMERIC PO Take 1 capsule by mouth 2 (two) times daily.        No Known Allergies  Consultations: Gastroenterology, Dr.  Paulita Fujita   Procedures/Studies: No results found.    Subjective: No further bleeding.  No abdominal pain.  Mentation is normal, no fever.  Discharge Exam: Vitals:   02/23/21 1230 02/23/21 1256  BP: 136/72 (!) 148/82  Pulse: (!) 55 (!) 58  Resp: 10 14  Temp:  97.8 F (36.6 C)  SpO2: 100% 97%   Vitals:   02/23/21 1210 02/23/21 1220 02/23/21 1230 02/23/21 1256  BP: (!) 100/58 114/67 136/72 (!) 148/82  Pulse: 62 62 (!) 55 (!) 58  Resp: 14 20 10 14   Temp: 97.9 F (36.6 C)   97.8 F (36.6 C)  TempSrc:    Oral  SpO2: 99% 100% 100% 97%  Weight:      Height:        General: Pt is alert, awake, not in acute distress Cardiovascular: RRR, nl S1-S2, no murmurs appreciated.   No LE edema.   Respiratory: Normal respiratory rate and rhythm.  CTAB without rales or wheezes. Abdominal: Abdomen soft and non-tender.  No distension or HSM.   Neuro/Psych: Strength symmetric in upper and lower extremities.  Judgment and insight appear normal.   The results of significant diagnostics from this hospitalization (including imaging, microbiology, ancillary and laboratory) are listed below for reference.     Microbiology: Recent Results (from the past 240 hour(s))  SARS CORONAVIRUS 2 (TAT 6-24 HRS) Nasopharyngeal Nasopharyngeal Swab     Status: None   Collection Time: 02/21/21 11:36 PM   Specimen: Nasopharyngeal Swab  Result Value Ref Range Status   SARS Coronavirus 2 NEGATIVE NEGATIVE Final    Comment: (NOTE) SARS-CoV-2 target nucleic acids are NOT DETECTED.  The SARS-CoV-2 RNA is generally detectable in upper and lower respiratory specimens during the acute phase of infection. Negative results do not preclude SARS-CoV-2 infection, do not rule out co-infections with other pathogens, and should not be used as the sole basis for treatment or other patient management decisions. Negative results must be combined with clinical observations, patient history, and epidemiological information.  The expected result is Negative.  Fact Sheet for Patients: SugarRoll.be  Fact Sheet for Healthcare Providers: https://www.woods-mathews.com/  This test is not yet approved or cleared by the [place] FDA and  has been authorized for detection and/or diagnosis of SARS-CoV-2 by FDA under an Emergency Use Authorization (EUA). This EUA will remain  in effect (meaning this test can be used) for the duration of the COVID-19 declaration under Se ction 564(b)(1) of the Act, 21 U.S.C. section 360bbb-3(b)(1), unless the authorization is terminated or revoked sooner.  Performed at [facility], [place] [address] [place], [place] [address]      Labs: BNP (last 3 results) No results for input(s): BNP in the last 8760 hours. Basic Metabolic Panel: Recent Labs  Lab 02/21/21 1715 02/22/21 0225  NA 137 137  K 4.1 4.1  CL 103 104  CO2 29 27  GLUCOSE 80 96  BUN 27* 24*  CREATININE 1.21 1.06  CALCIUM 9.2 8.9   Liver Function Tests: No results for input(s): AST, ALT, ALKPHOS, BILITOT, PROT, ALBUMIN in the last 168 hours. No results for input(s): LIPASE, AMYLASE in the last 168 hours. No results for input(s): AMMONIA in the last 168 hours. CBC: Recent Labs  Lab 02/21/21 1715 02/21/21 2307 02/22/21 1115 02/22/21 2335  WBC 9.6 9.0 8.3 7.9  NEUTROABS 6.4  --   --   --   HGB 13.7 13.3 13.3 13.8  HCT 42.2 41.1 41.6 42.6  MCV 82.6 82.5 81.9 82.4  PLT 313 279 290 293   Cardiac Enzymes: No results for input(s): CKTOTAL, CKMB, CKMBINDEX, TROPONINI in the last 168 hours. BNP: Invalid input(s): POCBNP CBG: No results for input(s): GLUCAP in the last 168 hours. D-Dimer No results for input(s): DDIMER in the last 72 hours. Hgb A1c No results for input(s): HGBA1C in the last 72 hours. Lipid Profile No results for input(s): CHOL, HDL, LDLCALC, TRIG, CHOLHDL, LDLDIRECT in the last 72 hours. Thyroid function studies No results for  input(s): TSH, T4TOTAL, T3FREE, THYROIDAB in the last 72 hours.  Invalid input(s): FREET3 Anemia work up No results for input(s): VITAMINB12, FOLATE, FERRITIN, TIBC, IRON, RETICCTPCT in the last 72 hours. Urinalysis    Component Value Date/Time   COLORURINE YELLOW 04/01/2015 1942   APPEARANCEUR CLEAR 04/01/2015 1942   LABSPEC 1.021 04/01/2015 1942   PHURINE 6.0 04/01/2015 1942   GLUCOSEU NEGATIVE 04/01/2015 1942   HGBUR NEGATIVE 04/01/2015 1942   BILIRUBINUR NEGATIVE 04/01/2015 1942   KETONESUR NEGATIVE 04/01/2015 1942   PROTEINUR NEGATIVE 04/01/2015 1942   UROBILINOGEN 0.2 04/01/2015 1942   NITRITE NEGATIVE 04/01/2015 1942   LEUKOCYTESUR NEGATIVE 04/01/2015 1942   Sepsis Labs Invalid input(s): PROCALCITONIN,  WBC,  LACTICIDVEN Microbiology Recent Results (from the past 240 hour(s))  SARS CORONAVIRUS 2 (TAT 6-24 HRS) Nasopharyngeal Nasopharyngeal Swab     Status: None   Collection Time: 02/21/21 11:36 PM   Specimen: Nasopharyngeal Swab  Result Value Ref Range Status   SARS Coronavirus 2 NEGATIVE NEGATIVE Final    Comment: (NOTE) SARS-CoV-2 target nucleic acids are NOT DETECTED.  The SARS-CoV-2 RNA is generally detectable in upper and lower respiratory specimens during the acute phase of infection. Negative results do not preclude SARS-CoV-2 infection, do not rule out co-infections with other pathogens, and should not be used as the sole basis for treatment or other patient management decisions. Negative results must be combined with clinical observations, patient history, and epidemiological information. The expected result is Negative.  Fact Sheet for Patients: SugarRoll.be  Fact Sheet for Healthcare Providers: https://www.woods-mathews.com/  This test is not yet approved or cleared by the [place] FDA and  has been authorized for detection and/or diagnosis of SARS-CoV-2 by FDA under an Emergency Use Authorization (EUA).  This EUA will remain  in effect (meaning this test can be used) for the duration of the COVID-19 declaration under Se ction 564(b)(1) of the Act, 21 U.S.C. section 360bbb-3(b)(1), unless the authorization is terminated or revoked sooner.  Performed at [facility], [place] [address] [place], [place] [address]      Time coordinating discharge: 25 minutes    30 Day Unplanned Readmission Risk Score    Flowsheet Row ED to Hosp-Admission (Current) from 02/21/2021 in [place]  30 Day Unplanned Readmission Risk Score (%) 7.76 Filed at 02/23/2021 0801       This score is the patient's risk of an unplanned readmission within 30 days  of being discharged (0 -100%). The score is based on dignosis, age, lab data, medications, orders, and past utilization.   Low:  0-14.9   Medium: 15-21.9   High: 22-29.9   Extreme: 30 and above            SIGNED:   Edwin Dada, MD  [facility] 02/23/2021, 11:20 PM

## 2021-02-23 NOTE — Interval H&P Note (Signed)
History and Physical Interval Note:  02/23/2021 11:31 AM  Cody Hodge  has presented today for surgery, with the diagnosis of rectal bleeding.  The various methods of treatment have been discussed with the patient and family. After consideration of risks, benefits and other options for treatment, the patient has consented to  Procedure(s): COLONOSCOPY (N/A) as a surgical intervention.  The patient's history has been reviewed, patient examined, no change in status, stable for surgery.  I have reviewed the patient's chart and labs.  Questions were answered to the patient's satisfaction.     Landry Dyke

## 2021-02-23 NOTE — Op Note (Signed)
[facility] Patient Name: Cody Hodge Procedure Date : 02/23/2021 MRN: [id number] Attending MD: Arta Silence , MD Date of Birth: 1945-10-03 CSN: [id number] Age: 75 Admit Type: Inpatient Procedure:                Colonoscopy Indications:              Last colonoscopy: 2014, Hematochezia Providers:                Arta Silence, MD, Doristine Johns, RN, Tyna Jaksch Technician Referring MD:             [facility] Medicines:                Monitored Anesthesia Care Complications:            No immediate complications. Estimated Blood Loss:     Estimated blood loss: none. Procedure:                Pre-Anesthesia Assessment:                           - Prior to the procedure, a History and Physical                            was performed, and patient medications and                            allergies were reviewed. The patient's tolerance of                            previous anesthesia was also reviewed. The risks                            and benefits of the procedure and the sedation                            options and risks were discussed with the patient.                            All questions were answered, and informed consent                            was obtained. Prior Anticoagulants: The patient has                            taken Eliquis (apixaban), last dose was 2 days                            prior to procedure. ASA Grade Assessment: III - A                            patient with severe systemic disease. After  reviewing the risks and benefits, the patient was                            deemed in satisfactory condition to undergo the                            procedure.                           After obtaining informed consent, the colonoscope                            was passed under direct vision. Throughout the                            procedure, the patient's blood  pressure, pulse, and                            oxygen saturations were monitored continuously. The                            CF-HQ190L ([id number]) Olympus colonoscope was                            introduced through the anus and advanced to the the                            cecum, identified by appendiceal orifice and                            ileocecal valve. The ileocecal valve, appendiceal                            orifice, and rectum were photographed. The entire                            colon was examined. The colonoscopy was performed                            without difficulty. The patient tolerated the                            procedure well. The quality of the bowel                            preparation was good. Scope In: 11:41:35 AM Scope Out: 41:93:79 AM Total Procedure Duration: 0 hours 15 minutes 11 seconds  Findings:      Hemorrhoids were found on perianal exam.      Multiple medium-mouthed diverticula were found in the sigmoid colon,       descending colon, transverse colon and ascending colon.      No other polyps, masses, vascular ectasias, or inflammatory changes seen       to the extent of our examination. No old/fresh blood seen to the extent  of our examination.      A medium fibrotic scar, post-internal hemorrhoidectomy was found in the       distal rectum. No stigmata of bleeding. Some residual internal       hemorrhoids seen.      No additional abnormalities were found on retroflexion. Impression:               - External hemorrhoids found on perianal exam.                           - Diverticulosis in the sigmoid colon, in the                            descending colon, in the transverse colon and in                            the ascending colon.                           - Fibrotic scar tissue from prior hemorrhoidectomy,                            some residual internal hemorrhoids, but no stigmata                            of bleeding  seen.                           - The examination was otherwise normal. No                            old/fresh blood was seen to the extent of our                            examination. Bleeding could be from hemorrhoids                            (but doesn't look like from post-hemorrhoidectomy                            ulceration) versus diverticulosis. Moderate Sedation:      None Recommendation:           - Return patient to hospital ward for ongoing care.                           - Soft diet today.                           - Continue present medications.                           - Resume Eliquis (apixaban) at prior dose in 3 days.                           - OK for patient to be discharged home today from  GI perspective.                           - Advise topical hemorrhoidal therapies (Prep-H                            suppositories, witch hazel prn)                           - We will arrange outpatient follow-up with Dr.                            Watt Climes. Procedure Code(s):        --- Professional ---                           [phone], Colonoscopy, flexible; diagnostic, including                            collection of specimen(s) by brushing or washing,                            when performed (separate procedure) Diagnosis Code(s):        --- Professional ---                           K64.9, Unspecified hemorrhoids                           K92.1, Melena (includes Hematochezia)                           K57.30, Diverticulosis of large intestine without                            perforation or abscess without bleeding CPT copyright 2019 American Medical Association. All rights reserved. The codes documented in this report are preliminary and upon coder review may  be revised to meet current compliance requirements. Arta Silence, MD 02/23/2021 12:20:55 PM This report has been signed electronically. Number of Addenda: 0

## 2021-02-23 NOTE — Progress Notes (Signed)
Nsg Discharge Note  Admit Date:  02/21/2021 Discharge date: 02/23/2021   Drue Harr to be D/C'd Home per MD order.  AVS completed.  Copy for chart, and copy for patient signed, and dated. Patient/caregiver able to verbalize understanding.  Discharge Medication: Allergies as of 02/23/2021   No Known Allergies      Medication List     TAKE these medications    atorvastatin 20 MG tablet Commonly known as: LIPITOR TAKE 1 TABLET BY MOUTH EVERY DAY IN THE MORNING What changed:  how much to take how to take this when to take this   cholecalciferol 1000 units tablet Commonly known as: VITAMIN D Take 1,000 Units by mouth daily.   Co Q-10 200 MG Caps Take 200 mg by mouth daily.   Eliquis 5 MG Tabs tablet Generic drug: apixaban TAKE 1 TABLET TWICE A DAY What changed: how much to take   ezetimibe 10 MG tablet Commonly known as: ZETIA Take 1 tablet (10 mg total) by mouth daily.   ferrous sulfate 325 (65 FE) MG tablet Take 325 mg by mouth daily.   finasteride 5 MG tablet Commonly known as: PROSCAR Take 5 mg by mouth daily.   ibuprofen 200 MG tablet Commonly known as: ADVIL Take 400 mg by mouth 2 (two) times daily as needed for moderate pain.   metoprolol succinate 25 MG 24 hr tablet Commonly known as: TOPROL-XL Take 1 tablet (25 mg total) by mouth daily. Please schedule appointment for refills   multivitamin with minerals Tabs tablet Take 1 tablet by mouth daily.   OVER THE COUNTER MEDICATION Take 2 capsules by mouth every morning. elysium   ramipril 5 MG capsule Commonly known as: ALTACE Take 1 capsule (5 mg total) by mouth daily.   Resveratrol 250 MG Caps Take 250 mg by mouth 2 (two) times daily.   SYSTANE OP Place 1 drop into both eyes 2 (two) times daily.   triamcinolone cream 0.1 % Commonly known as: KENALOG Apply 1 application topically as needed (rash).   TURMERIC PO Take 1 capsule by mouth 2 (two) times daily.        Discharge  Assessment: Vitals:   02/23/21 1230 02/23/21 1256  BP: 136/72 (!) 148/82  Pulse: (!) 55 (!) 58  Resp: 10 14  Temp:  97.8 F (36.6 C)  SpO2: 100% 97%   Skin clean, dry and intact without evidence of skin break down, no evidence of skin tears noted. IV catheter discontinued intact. Site without signs and symptoms of complications - no redness or edema noted at insertion site, patient denies c/o pain - only slight tenderness at site.  Dressing with slight pressure applied.  D/c Instructions-Education: Discharge instructions given to patient/family with verbalized understanding. D/c education completed with patient/family including follow up instructions, medication list, d/c activities limitations if indicated, with other d/c instructions as indicated by MD - patient able to verbalize understanding, all questions fully answered. Patient instructed to return to ED, call 911, or call MD for any changes in condition.  Patient escorted via [place], and D/C home via private auto.  Erasmo Leventhal, RN 02/23/2021 1:36 PM

## 2021-02-23 NOTE — Anesthesia Postprocedure Evaluation (Signed)
Anesthesia Post Note  Patient: Cody Hodge  Procedure(s) Performed: COLONOSCOPY     Patient location during evaluation: Endoscopy Anesthesia Type: MAC Level of consciousness: awake and alert Pain management: pain level controlled Vital Signs Assessment: post-procedure vital signs reviewed and stable Respiratory status: spontaneous breathing, nonlabored ventilation, respiratory function stable and patient connected to nasal cannula oxygen Cardiovascular status: blood pressure returned to baseline and stable Postop Assessment: no apparent nausea or vomiting Anesthetic complications: no   No notable events documented.  Last Vitals:  Vitals:   02/23/21 1230 02/23/21 1256  BP: 136/72 (!) 148/82  Pulse: (!) 55 (!) 58  Resp: 10 14  Temp:  36.6 C  SpO2: 100% 97%    Last Pain:  Vitals:   02/23/21 1256  TempSrc: Oral  PainSc: 0-No pain                 Danessa Mensch L Shamara Soza

## 2021-02-23 NOTE — Anesthesia Preprocedure Evaluation (Addendum)
Anesthesia Evaluation  Patient identified by MRN, date of birth, ID band Patient awake    Reviewed: Allergy & Precautions, NPO status , Patient's Chart, lab work & pertinent test results, reviewed documented beta blocker date and time   Airway Mallampati: I  TM Distance: >3 FB Neck ROM: Full    Dental no notable dental hx. (+) Teeth Intact, Dental Advisory Given   Pulmonary sleep apnea (no CPAP) ,    Pulmonary exam normal breath sounds clear to auscultation       Cardiovascular hypertension, Pt. on home beta blockers and Pt. on medications + angina + CAD and + Cardiac Stents  Normal cardiovascular exam+ dysrhythmias (on eliquis) Atrial Fibrillation  Rhythm:Regular Rate:Normal  [facility] 2018 Coronary angiogram 03/18/2017: Left main: Distal 20% stenosis Left anterior descending: No critical stenosis. Diagonal 1 & Diagonal 2 80% proximal stenoses, not suitable for coronary intervention due to acute angle. Left circumflex: Small nondominant vessel with proximal 90% stenosis, followed by subtotal distal OM-1. Proximal and mid severe diffuse disease and extends to LM. High risk lesion for PCI and very small. Right: Large dominant vessel with posterior descending and posterolateral branches.  Large second posterlolateral branch with 90% napking ring stenosis, proximal diffuse 30-40% disease. This vessel also gives collaterals to second OM.  Interventional Data: Successful direct stenting of right posterolateral branch lesion with SYNERGY 3.0 X 28 mm drug eluting stent post dilated with 3.25x18 mm [place] balloon. 80% to 0% with TIMI 3 to TIMI 3 flow maintained.  Recommend clopidogrel 75 mg daily, along with apixaban 5 mg twice a day. No aspirin due to bleeding risk. Home today with OP f/u.    Neuro/Psych negative neurological ROS  negative psych ROS   GI/Hepatic negative GI ROS, Neg liver ROS,   Endo/Other  negative endocrine ROS   Renal/GU negative Renal ROS  negative genitourinary   Musculoskeletal negative musculoskeletal ROS (+)   Abdominal   Peds  Hematology  (+) Blood dyscrasia (Hgb 13.8), ,   Anesthesia Other Findings 75 y.o. male with medical history significant for asymptomatic carotid artery stenosis, CAD, hyperlipidemia, hypertension, and paroxysmal atrial fibrillation on Eliquis, now presenting to the emergency department for evaluation of rectal bleeding  Reproductive/Obstetrics                            Anesthesia Physical Anesthesia Plan  ASA: 3  Anesthesia Plan: MAC   Post-op Pain Management:    Induction: Intravenous  PONV Risk Score and Plan: Propofol infusion and Treatment may vary due to age or medical condition  Airway Management Planned: Natural Airway  Additional Equipment:   Intra-op Plan:   Post-operative Plan:   Informed Consent: I have reviewed the patients History and Physical, chart, labs and discussed the procedure including the risks, benefits and alternatives for the proposed anesthesia with the patient or authorized representative who has indicated his/her understanding and acceptance.     Dental advisory given  Plan Discussed with: CRNA  Anesthesia Plan Comments:         Anesthesia Quick Evaluation

## 2021-02-23 NOTE — Transfer of Care (Signed)
Immediate Anesthesia Transfer of Care Note  Patient: Cody Hodge  Procedure(s) Performed: COLONOSCOPY  Patient Location: Endoscopy Unit  Anesthesia Type:MAC  Level of Consciousness: responds to stimulation  Airway & Oxygen Therapy: Patient Spontanous Breathing and Patient connected to nasal cannula oxygen  Post-op Assessment: Report given to RN and Post -op Vital signs reviewed and stable  Post vital signs: Reviewed and stable  Last Vitals:  Vitals Value Taken Time  BP    Temp    Pulse 68 02/23/21 1201  Resp    SpO2 96 % 02/23/21 1201  Vitals shown include unvalidated device data.  Last Pain:  Vitals:   02/23/21 1040  TempSrc: Axillary  PainSc: 0-No pain         Complications: No notable events documented.

## 2021-03-01 ENCOUNTER — Other Ambulatory Visit: Payer: Self-pay

## 2021-03-01 ENCOUNTER — Ambulatory Visit
Admission: RE | Admit: 2021-03-01 | Discharge: 2021-03-01 | Disposition: A | Discharge status: Home / Self Care | Payer: Medicare Other | Source: Ambulatory Visit | Attending: Surgery | Admitting: Surgery

## 2021-03-01 ENCOUNTER — Other Ambulatory Visit: Payer: Self-pay | Admitting: Cardiology

## 2021-03-01 DIAGNOSIS — I6523 Occlusion and stenosis of bilateral carotid arteries: Secondary | ICD-10-CM

## 2021-03-01 DIAGNOSIS — I672 Cerebral atherosclerosis: Secondary | ICD-10-CM | POA: Diagnosis not present

## 2021-03-01 DIAGNOSIS — Z85820 Personal history of malignant melanoma of skin: Secondary | ICD-10-CM | POA: Diagnosis not present

## 2021-03-01 DIAGNOSIS — M47812 Spondylosis without myelopathy or radiculopathy, cervical region: Secondary | ICD-10-CM | POA: Diagnosis not present

## 2021-03-01 MED ORDER — IOPAMIDOL (ISOVUE-370) INJECTION 76%
75.0000 mL | Freq: Once | INTRAVENOUS | Status: AC | PRN
  Administered 2021-03-01: 75 mL via INTRAVENOUS

## 2021-03-05 ENCOUNTER — Ambulatory Visit (INDEPENDENT_AMBULATORY_CARE_PROVIDER_SITE_OTHER): Payer: Medicare Other | Admitting: Surgery

## 2021-03-05 ENCOUNTER — Other Ambulatory Visit: Payer: Self-pay

## 2021-03-05 ENCOUNTER — Encounter: Payer: Self-pay | Admitting: Surgery

## 2021-03-05 VITALS — BP 118/74 | HR 66 | Temp 98.4°F | Resp 20 | Ht 70.0 in | Wt 182.6 lb

## 2021-03-05 DIAGNOSIS — I6523 Occlusion and stenosis of bilateral carotid arteries: Secondary | ICD-10-CM

## 2021-03-05 NOTE — Progress Notes (Signed)
V[facility] and [facility]in Specialist of [facility]  Patient name: Cody Hodge MRN: [id number] DOB: February 06, 1946 Sex: male   REASON FOR VISIT:    Follow up  HISOTRY OF PRESENT ILLNESS:    Cody Hodge is a 75 y.o. male, who is referred for evaluation of bilateral carotid stenosis, left greater than right.  The patient is asymptomatic.  Specifically, he denies numbness or weakness in either extremity.  He denies slurred speech.  He denies amaurosis fugax.  The patient suffers from paroxysmal atrial fibrillation.  He is on Eliquis for this.  He takes a statin for hypercholesterolemia.  He has coronary artery disease, status post angiography and PCI in 2018.   PAST MEDICAL HISTORY:   Past Medical History:  Diagnosis Date   Arthritis    BPH (benign prostatic hyperplasia)    Coronary artery disease 09/2014   50% mid to distal LAD, 50-70% ostial first diag, 40% ostial and mid left circ.   ED (erectile dysfunction)    Hemorrhoids    associated with bowel movements   HTN (hypertension)    Hypercholesteremia    Knee arthropathy    Melanoma (HCC)    Melanoma (HCC)    Mixed hyperlipidemia    OSA (obstructive sleep apnea)    mild to moderate refused CPAP   PAF (paroxysmal atrial fibrillation) (HCC)    CHADS VASC score 2 (age>65 and HTN)   Postoperative anemia due to acute blood loss 04/03/2015   Tremor      FAMILY HISTORY:   Family History  Problem Relation Age of Onset   Dementia Mother    Heart attack Father    Heart disease Father    Alzheimer's disease Sister     SOCIAL HISTORY:   Social History   Tobacco Use   Smoking status: Never   Smokeless tobacco: Never  Substance Use Topics   Alcohol use: Yes    Comment: 4 drinks per week     ALLERGIES:   No Known Allergies   CURRENT MEDICATIONS:   Current Outpatient Medications  Medication Sig Dispense Refill   atorvastatin (LIPITOR) 20 MG tablet TAKE 1 TABLET BY MOUTH EVERY  DAY IN THE MORNING 90 tablet 1   cholecalciferol (VITAMIN D) 1000 units tablet Take 1,000 Units by mouth daily.     Coenzyme Q10 (CO Q-10) 200 MG CAPS Take 200 mg by mouth daily.     ELIQUIS 5 MG TABS tablet TAKE 1 TABLET TWICE A DAY 180 tablet 3   ezetimibe (ZETIA) 10 MG tablet Take 1 tablet (10 mg total) by mouth daily. 90 tablet 1   ferrous sulfate 325 (65 FE) MG tablet Take 325 mg by mouth daily.     finasteride (PROSCAR) 5 MG tablet Take 5 mg by mouth daily.      ibuprofen (ADVIL,MOTRIN) 200 MG tablet Take 400 mg by mouth 2 (two) times daily as needed for moderate pain.     metoprolol succinate (TOPROL-XL) 25 MG 24 hr tablet Take 1 tablet (25 mg total) by mouth daily. Please schedule appointment for refills 90 tablet 2   Multiple Vitamin (MULTIVITAMIN WITH MINERALS) TABS tablet Take 1 tablet by mouth daily.     OVER THE COUNTER MEDICATION Take 2 capsules by mouth every morning. elysium     Polyethyl Glycol-Propyl Glycol (SYSTANE OP) Place 1 drop into both eyes 2 (two) times daily.      ramipril (ALTACE) 5 MG capsule TAKE 1 CAPSULE DAILY 90 capsule 3   Resveratrol 250 MG  CAPS Take 250 mg by mouth 2 (two) times daily.     triamcinolone cream (KENALOG) 0.1 % Apply 1 application topically as needed (rash).     TURMERIC PO Take 1 capsule by mouth 2 (two) times daily.      No current facility-administered medications for this visit.    REVIEW OF SYSTEMS:   '[X]'$  denotes positive finding, '[ ]'$  denotes negative finding Cardiac  Comments:  Chest pain or chest pressure:    Shortness of breath upon exertion:    Short of breath when lying flat:    Irregular heart rhythm:        Vascular    Pain in calf, thigh, or hip brought on by ambulation:    Pain in feet at night that wakes you up from your sleep:     Blood clot in your veins:    Leg swelling:         Pulmonary    Oxygen at home:    Productive cough:     Wheezing:         Neurologic    Sudden weakness in arms or legs:     Sudden  numbness in arms or legs:     Sudden onset of difficulty speaking or slurred speech:    Temporary loss of vision in one eye:     Problems with dizziness:         Gastrointestinal    Blood in stool:     Vomited blood:         Genitourinary    Burning when urinating:     Blood in urine:        Psychiatric    Major depression:         Hematologic    Bleeding problems:    Problems with blood clotting too easily:        Skin    Rashes or ulcers:        Constitutional    Fever or chills:      PHYSICAL EXAM:   Vitals:   03/05/21 1216 03/05/21 1218  BP: 109/61 118/74  Pulse: 66   Resp: 20   Temp: 98.4 F (36.9 C)   SpO2: 94%   Weight: 182 lb 9.6 oz (82.8 kg)   Height: '5\' 10"'$  (1.778 m)     GENERAL: The patient is a well-nourished male, in no acute distress. The vital signs are documented above. CARDIAC: There is a regular rate and rhythm.  PULMONARY: Non-labored respirations MUSCULOSKELETAL: There are no major deformities or cyanosis. NEUROLOGIC: No focal weakness or paresthesias are detected. SKIN: There are no ulcers or rashes noted. PSYCHIATRIC: The patient has a normal affect.  STUDIES:   I have reviewed his CT scan with the following findings: 1. 65-70 stenosis of the proximal left internal carotid artery relative to the more distal vessel. Stenosis is 1.4 cm from the bifurcation. 2. Atherosclerotic changes at the right carotid bifurcation without significant stenosis. 3. Atherosclerotic changes at the origins the great vessels and in the distal left V1 segment without significant stenosis. 4. Multilevel spondylosis of the cervical spine as described. MEDICAL ISSUES:   Asymptomatic carotid stenosis, left greater than right: The patient came back today for review of his CT scan.  This shows only 65-70% left carotid stenosis.  I discussed that as long as he remains asymptomatic and the stenosis remains less than 80% that I would not recommend  intervention.  Based off of his anatomy, his bifurcation is on  the low side and so I would likely have to put a wire into the external carotid for TCAR which should not be an issue.  I would like for him to be on a baby aspirin, however he was recently in the hospital for a GI bleed.  He is scheduled to see GI next month.  If they clear him, he will start taking a baby aspirin in addition to his Eliquis.  The patient is getting his carotid follow-up with Dr. Einar Gip.  I will not schedule him for follow-up with me, until he has a ultrasound that shows a change in his velocity profile suggesting a change in his stenosis.    Leia Alf, MD, FACS [facility] [phone] Pager [phone]

## 2021-03-27 DIAGNOSIS — H26492 Other secondary cataract, left eye: Secondary | ICD-10-CM | POA: Diagnosis not present

## 2021-03-27 DIAGNOSIS — H04123 Dry eye syndrome of bilateral lacrimal glands: Secondary | ICD-10-CM | POA: Diagnosis not present

## 2021-03-27 DIAGNOSIS — H43812 Vitreous degeneration, left eye: Secondary | ICD-10-CM | POA: Diagnosis not present

## 2021-03-27 DIAGNOSIS — H34232 Retinal artery branch occlusion, left eye: Secondary | ICD-10-CM | POA: Diagnosis not present

## 2021-03-27 DIAGNOSIS — Z9889 Other specified postprocedural states: Secondary | ICD-10-CM | POA: Diagnosis not present

## 2021-03-27 DIAGNOSIS — Z961 Presence of intraocular lens: Secondary | ICD-10-CM | POA: Diagnosis not present

## 2021-04-04 DIAGNOSIS — R972 Elevated prostate specific antigen [PSA]: Secondary | ICD-10-CM | POA: Diagnosis not present

## 2021-04-09 ENCOUNTER — Other Ambulatory Visit: Payer: Self-pay | Admitting: Cardiology

## 2021-04-09 DIAGNOSIS — E782 Mixed hyperlipidemia: Secondary | ICD-10-CM

## 2021-04-11 DIAGNOSIS — N401 Enlarged prostate with lower urinary tract symptoms: Secondary | ICD-10-CM | POA: Diagnosis not present

## 2021-04-11 DIAGNOSIS — R972 Elevated prostate specific antigen [PSA]: Secondary | ICD-10-CM | POA: Diagnosis not present

## 2021-04-11 DIAGNOSIS — R351 Nocturia: Secondary | ICD-10-CM | POA: Diagnosis not present

## 2021-04-18 ENCOUNTER — Ambulatory Visit: Payer: Medicare Other | Admitting: Cardiology

## 2021-04-23 DIAGNOSIS — K921 Melena: Secondary | ICD-10-CM | POA: Diagnosis not present

## 2021-04-23 DIAGNOSIS — K648 Other hemorrhoids: Secondary | ICD-10-CM | POA: Diagnosis not present

## 2021-04-25 ENCOUNTER — Ambulatory Visit: Payer: Medicare Other | Admitting: Cardiology

## 2021-04-25 ENCOUNTER — Encounter: Payer: Self-pay | Admitting: Cardiology

## 2021-04-25 ENCOUNTER — Other Ambulatory Visit: Payer: Self-pay

## 2021-04-25 VITALS — BP 134/73 | HR 71 | Temp 98.1°F | Resp 17 | Ht 70.0 in | Wt 184.0 lb

## 2021-04-25 DIAGNOSIS — I6523 Occlusion and stenosis of bilateral carotid arteries: Secondary | ICD-10-CM

## 2021-04-25 DIAGNOSIS — E7800 Pure hypercholesterolemia, unspecified: Secondary | ICD-10-CM

## 2021-04-25 DIAGNOSIS — I480 Paroxysmal atrial fibrillation: Secondary | ICD-10-CM

## 2021-04-25 DIAGNOSIS — I25118 Atherosclerotic heart disease of native coronary artery with other forms of angina pectoris: Secondary | ICD-10-CM | POA: Diagnosis not present

## 2021-04-25 NOTE — Progress Notes (Signed)
Primary Physician/Referring:  Clovis Riley, L.August Saucer, MD  Patient ID: Cody Hodge, male    DOB: 1946-07-10, 75 y.o.   MRN: [id number]  Chief Complaint  Patient presents with   Follow-up    3 month   Coronary Artery Disease   carotid stenosis   Hyperlipidemia   HPI:    Cody Hodge  is a 75 y.o.  male  with  paroxysmal atrial fibrillation, hyperlipidemia, asymptomatic bilateral carotid artery stenosis, CAD S/P coronary angiography on 03/17/2017 and underwent stenting to his very large PL branch of RCA and has residual; D1 and D2 stenosis recommended medical therapy due to anatomy and an occluded small circumflex OM-1, now presents for follow-up. Of note, patient lives in [place] during winters and here in [place] during most other months.  On his last office visit on 01/11/2021, I referred him to be evaluated by Dr. Myra Gianotti for progression of left carotid stenosis.  He underwent CT angiogram of the neck which revealed 60 to 70% stenosis of proximal left ICA and felt that we could certainly continue to watch him for now.  In the interim patient also was admitted to the hospital on 02/21/2021 discharged 2 days later with rectal bleed, colonoscopy revealing internal hemorrhoids and numerous colonic diverticula.  He now presents for 33-month office visit.  Patient is presently doing well and continues to exercise on a regular basis by playing tennis.  He denies chest pain, shortness of breath, palpitations, syncope, said symptoms suggestive of TIA or CVA.  He is tolerating Eliquis well without further bleeding diathesis.  About a month ago he had sudden onset dizziness and near syncope with any stood up and walked in the house but states that he just returned from tennis.  Past Medical History:  Diagnosis Date   Arthritis    BPH (benign prostatic hyperplasia)    Coronary artery disease 09/2014   50% mid to distal LAD, 50-70% ostial first diag, 40% ostial and mid left circ.   ED (erectile dysfunction)     Hemorrhoids    associated with bowel movements   HTN (hypertension)    Hypercholesteremia    Knee arthropathy    Melanoma (HCC)    Melanoma (HCC)    Mixed hyperlipidemia    OSA (obstructive sleep apnea)    mild to moderate refused CPAP   PAF (paroxysmal atrial fibrillation) (HCC)    CHADS VASC score 2 (age>65 and HTN)   Postoperative anemia due to acute blood loss 04/03/2015   Tremor    Past Surgical History:  Procedure Laterality Date   CARDIAC CATHETERIZATION     2'16- only ever heart cath   CATARACT EXTRACTION, BILATERAL     COLONOSCOPY N/A 02/23/2021   Procedure: COLONOSCOPY;  Surgeon: Willis Modena, MD;  Location: [facility] ENDOSCOPY;  Service: Endoscopy;  Laterality: N/A;   CORONARY STENT INTERVENTION N/A 03/18/2017   Procedure: CORONARY STENT INTERVENTION;  Surgeon: Elder Negus, MD;  Location: [facility] INVASIVE CV LAB;  Service: Cardiovascular;  Laterality: N/A;   HEMORROIDECTOMY     HYDROCELE EXCISION / REPAIR     LASIK     left and right arthroscopy     left forearm melanoma     LEFT HEART CATH AND CORONARY ANGIOGRAPHY N/A 03/18/2017   Procedure: Left Heart Cath and Coronary Angiography;  Surgeon: Elder Negus, MD;  Location: [facility] INVASIVE CV LAB;  Service: Cardiovascular;  Laterality: N/A;   LEFT HEART CATHETERIZATION WITH CORONARY ANGIOGRAM N/A 10/06/2014   Procedure: LEFT HEART CATHETERIZATION WITH  CORONARY ANGIOGRAM;  Surgeon: Sinclair Grooms, MD;  Location: [facility] CATH LAB;  Service: Cardiovascular;  Laterality: N/A;   TOTAL KNEE ARTHROPLASTY Bilateral 03/29/2015   Procedure: TOTAL KNEE BILATERAL;  Surgeon: Gaynelle Arabian, MD;  Location: [facility] ORS;  Service: Orthopedics;  Laterality: Bilateral;  spinal anes. placed in OR   Family History  Problem Relation Age of Onset   Dementia Mother    Heart attack Father    Heart disease Father    Alzheimer's disease Sister     Social History   Tobacco Use   Smoking status: Never   Smokeless tobacco: Never  Substance Use  Topics   Alcohol use: Yes    Comment: 4 drinks per week    Marital Status: Married  ROS  Review of Systems  Cardiovascular:  Negative for dyspnea on exertion, leg swelling and syncope.  Respiratory:  Negative for shortness of breath.   Musculoskeletal:  Positive for joint pain. Negative for joint swelling.  Gastrointestinal:  Positive for hemorrhoids (chronic). Negative for melena.  Objective  Blood pressure 134/73, pulse 71, temperature 98.1 F (36.7 C), temperature source Temporal, resp. rate 17, height $RemoveBe'5\' 10"'uTMDpZnEs$  (1.778 m), weight 184 lb (83.5 kg), SpO2 95 %.  Vitals with BMI 04/25/2021 03/05/2021 03/05/2021  Height $Remov'5\' 10"'PKHEgZ$  - $'5\' 10"'V$   Weight 184 lbs - 182 lbs 10 oz  BMI 63.8 - 75.6  Systolic 433 295 188  Diastolic 73 74 61  Pulse 71 - 66     Physical Exam Constitutional:      General: He is not in acute distress.    Appearance: He is well-developed.  Neck:     Thyroid: No thyromegaly.     Vascular: Carotid bruit (bilateral) present. No JVD.  Cardiovascular:     Rate and Rhythm: Normal rate and regular rhythm.     Pulses: Normal pulses and intact distal pulses.     Heart sounds: No murmur heard.   No gallop.  Pulmonary:     Effort: No accessory muscle usage.     Breath sounds: Normal breath sounds.  Abdominal:     General: Bowel sounds are normal.     Palpations: Abdomen is soft.  Musculoskeletal:        General: No swelling. Normal range of motion.   Laboratory examination:   Recent Labs    02/21/21 1715 02/22/21 0225  NA 137 137  K 4.1 4.1  CL 103 104  CO2 29 27  GLUCOSE 80 96  BUN 27* 24*  CREATININE 1.21 1.06  CALCIUM 9.2 8.9  GFRNONAA >60 >60   CrCl cannot be calculated (Patient's most recent lab result is older than the maximum 21 days allowed.).  CMP Latest Ref Rng & Units 02/22/2021 02/21/2021 12/23/2019  Glucose 70 - 99 mg/dL 96 80 88  BUN 8 - 23 mg/dL 24(H) 27(H) 26  Creatinine 0.61 - 1.24 mg/dL 1.06 1.21 1.17  Sodium 135 - 145 mmol/L 137 137 139   Potassium 3.5 - 5.1 mmol/L 4.1 4.1 5.2  Chloride 98 - 111 mmol/L 104 103 103  CO2 22 - 32 mmol/L $RemoveB'27 29 26  'EiGGGmyn$ Calcium 8.9 - 10.3 mg/dL 8.9 9.2 9.4  Total Protein 6.0 - 8.5 g/dL - - 6.6  Total Bilirubin 0.0 - 1.2 mg/dL - - 0.4  Alkaline Phos 39 - 117 IU/L - - 128(H)  AST 0 - 40 IU/L - - 41(H)  ALT 0 - 44 IU/L - - 54(H)   CBC Latest Ref Rng &  Units 02/22/2021 02/22/2021 02/21/2021  WBC 4.0 - 10.5 K/uL 7.9 8.3 9.0  Hemoglobin 13.0 - 17.0 g/dL 13.8 13.3 13.3  Hematocrit 39.0 - 52.0 % 42.6 41.6 41.1  Platelets 150 - 400 K/uL 293 290 279   Lipid Panel Recent Labs    06/06/20 0852  CHOL 120  TRIG 75  LDLCALC 62  HDL 43    HEMOGLOBIN A1C Lab Results  Component Value Date   HGBA1C 5.3 12/23/2019   TSH No results for input(s): TSH in the last 8760 hours. External labs:   02/20/2018: HB 13.8/HCT 41.4, platelets 262, normal indicis.  Serum glucose 91 mg, BUN 32, creatinine 1.08, eGFR greater than 60 ML, potassium 4.7, CMP otherwise normal.  Ferritin normal.  Total cholesterol 136, triglycerides 100, HDL 42, LDL 74.  Medications and allergies  No Known Allergies   Current Outpatient Medications on File Prior to Visit  Medication Sig Dispense Refill   atorvastatin (LIPITOR) 20 MG tablet TAKE 1 TABLET DAILY IN THE MORNING 90 tablet 0   cholecalciferol (VITAMIN D) 1000 units tablet Take 1,000 Units by mouth daily.     Coenzyme Q10 (CO Q-10) 200 MG CAPS Take 200 mg by mouth daily.     ELIQUIS 5 MG TABS tablet TAKE 1 TABLET TWICE A DAY 180 tablet 3   ezetimibe (ZETIA) 10 MG tablet Take 1 tablet (10 mg total) by mouth daily. 90 tablet 1   ferrous sulfate 325 (65 FE) MG tablet Take 325 mg by mouth daily.     finasteride (PROSCAR) 5 MG tablet Take 5 mg by mouth daily.      ibuprofen (ADVIL,MOTRIN) 200 MG tablet Take 400 mg by mouth 2 (two) times daily as needed for moderate pain.     metoprolol succinate (TOPROL-XL) 25 MG 24 hr tablet Take 1 tablet (25 mg total) by mouth daily. Please  schedule appointment for refills 90 tablet 2   Multiple Vitamin (MULTIVITAMIN WITH MINERALS) TABS tablet Take 1 tablet by mouth daily.     OVER THE COUNTER MEDICATION Take 2 capsules by mouth every morning. elysium     Polyethyl Glycol-Propyl Glycol (SYSTANE OP) Place 1 drop into both eyes 2 (two) times daily.      ramipril (ALTACE) 5 MG capsule TAKE 1 CAPSULE DAILY 90 capsule 3   Resveratrol 250 MG CAPS Take 250 mg by mouth 2 (two) times daily.     triamcinolone cream (KENALOG) 0.1 % Apply 1 application topically as needed (rash).     TURMERIC PO Take 1 capsule by mouth 2 (two) times daily.      No current facility-administered medications on file prior to visit.    Radiology:   CT angiography of the neck 03/01/2020: 1. 65-70 stenosis of the proximal left internal carotid artery relative to the more distal vessel. Stenosis is 1.4 cm from the bifurcation. 2. Atherosclerotic changes at the right carotid bifurcation without significant stenosis. 3. Atherosclerotic changes at the origins the great vessels and in the distal left V1 segment without significant stenosis. 4. Multilevel spondylosis of the cervical spine as described.  Cardiac Studies:   Coronary angiogram 03/18/2017 : RCA Large PL-2 90% stenosis, stenting with SYNERGY 3.0 X 28 mm DES. Left main: Distal 20% stenosis. LAD: D-1 & D-2 80% proximal stenoses, not suitable for coronary intervention due to acute angle unless symptoms. Cx Small nondominant vessel with ostial 90% stenosis, followed by subtotal Mid OM-1.  Echocardiogram 01/23/2017: Left ventricle cavity is normal in size. Mild asymmetric hypertrophy  of the left ventricle. Normal global wall motion. Visual EF is 50-55%. Normal diastolic filling pattern, normal LAP. Left atrial cavity is mildly dilated at 4.2 cm. Trace tricuspid regurgitation. Mild pulmonary hypertension. Pulmonary artery systolic pressure is estimated at 33 mm Hg. IVC is minimally dilated with blunted  respiratory response. May suggest elevated central venous pressure.  Compared to 07/70/2016, mild pulmonary hypertension new.  Vascular Ultrasound Lower Extremity Venous 11/20/2017: Right: Probable acute DVT in the proximal and mid peroneal veins.  Pulsatile venous flow noted in the CFV. SFJ, DFV, and popliteal vein.  Interstitial fluid noted posterior knee.  Left: No evidence of common femoral vein obstruction.  Carotid artery duplex 01/05/2021: Stenosis in the right internal carotid artery (50-69%). Stenosis in the right external carotid artery (<50%). Stenosis in the left internal carotid artery (80-99%). Prox ICA 402/158 cm/S. The left PSV internal/common carotid artery ratio of 5.74 is consistent with a stenosis of >70%. Stenosis in the left external carotid artery (<50%). See enclosed image.  Antegrade right vertebral artery flow. Antegrade left vertebral artery flow. Compared to 05/03/2020, there is progression of ICA stenosis bilaterally.  Consider vascular surgical referral. EKG  EKG 01/11/2021: Normal sinus rhythm at rate of 62 bpm, leftward enlargement, otherwise normal EKG. NO SIGNIFICANT CHANGE FROM 12/27/2019.     Assessment     ICD-10-CM   1. Asymptomatic bilateral carotid artery stenosis  I65.23 PCV CAROTID DUPLEX (BILATERAL)    2. PAF (paroxysmal atrial fibrillation) (HCC). CHA2DS2-VASc Score is 5. Yearly risk of stroke: 6.7% (A, CAD, HTN, TIA-retinal infarct).    I48.0     3. Coronary artery disease of native artery of native heart with stable angina pectoris (HCC)  I25.118     4. Hypercholesteremia  E78.00        No orders of the defined types were placed in this encounter.   There are no discontinued medications.  Recommendations:   Tyliek Timberman  is a 75 y.o.  male  with  paroxysmal atrial fibrillation, hyperlipidemia, asymptomatic bilateral carotid artery stenosis, CAD S/P coronary angiography on 03/17/2017 and underwent stenting to his very large PL branch  of RCA and has residual; D1 and D2 stenosis recommended medical therapy due to anatomy and an occluded small circumflex OM-1, now presents for follow-up. Of note, patient lives in [place] during winters and here in [place] during most other months.  On his last office visit on 01/11/2021, I referred him to be evaluated by Dr. Myra Gianotti for progression of left carotid stenosis.  He underwent CT angiogram of the neck which revealed 60 to 70% stenosis of proximal left ICA and felt that we could certainly continue to watch him for now.  In the interim patient also was admitted to the hospital on 02/21/2021 discharged 2 days later with rectal bleed, colonoscopy revealing internal hemorrhoids and numerous colonic diverticula.  He now presents for 63-month office visit.  Overall he has not had any angina pectoris and blood pressure is well controlled and lipids are at goal.  I evaluated his hospital records, looks like most of the GI bleed occurred due to external hemorrhoids.  He is presently back on anticoagulation without recurrence of GI bleed.  I also reviewed the CT angiogram of the neck, there is big discrepancy between carotid duplex and angiography with right carotid artery showing no evidence of stenosis by angiography but 50 to 69% stenosis by duplex and >80% stenosis by duplex on the left but by CTA it is only about 75%.  This could be her baseline.  I would recheck his carotids in 6 months.  I will see him back after the carotid duplex.  Lipids are at goal, continue present medications.  Blood pressure is also well controlled.      Adrian Prows, MD, [facility] 04/25/2021, 8:39 PM Office: [phone] Fax: [phone] Pager: [phone]

## 2021-04-27 DIAGNOSIS — Z23 Encounter for immunization: Secondary | ICD-10-CM | POA: Diagnosis not present

## 2021-04-29 DIAGNOSIS — Z23 Encounter for immunization: Secondary | ICD-10-CM | POA: Diagnosis not present

## 2021-05-01 DIAGNOSIS — Z0000 Encounter for general adult medical examination without abnormal findings: Secondary | ICD-10-CM | POA: Diagnosis not present

## 2021-05-01 DIAGNOSIS — S80819A Abrasion, unspecified lower leg, initial encounter: Secondary | ICD-10-CM | POA: Diagnosis not present

## 2021-05-01 DIAGNOSIS — M1990 Unspecified osteoarthritis, unspecified site: Secondary | ICD-10-CM | POA: Diagnosis not present

## 2021-05-01 DIAGNOSIS — M7061 Trochanteric bursitis, right hip: Secondary | ICD-10-CM | POA: Diagnosis not present

## 2021-05-01 DIAGNOSIS — E7800 Pure hypercholesterolemia, unspecified: Secondary | ICD-10-CM | POA: Diagnosis not present

## 2021-05-15 DIAGNOSIS — L603 Nail dystrophy: Secondary | ICD-10-CM | POA: Diagnosis not present

## 2021-05-15 DIAGNOSIS — Z85820 Personal history of malignant melanoma of skin: Secondary | ICD-10-CM | POA: Diagnosis not present

## 2021-05-15 DIAGNOSIS — C4442 Squamous cell carcinoma of skin of scalp and neck: Secondary | ICD-10-CM | POA: Diagnosis not present

## 2021-05-15 DIAGNOSIS — D485 Neoplasm of uncertain behavior of skin: Secondary | ICD-10-CM | POA: Diagnosis not present

## 2021-05-15 DIAGNOSIS — D1801 Hemangioma of skin and subcutaneous tissue: Secondary | ICD-10-CM | POA: Diagnosis not present

## 2021-05-15 DIAGNOSIS — L570 Actinic keratosis: Secondary | ICD-10-CM | POA: Diagnosis not present

## 2021-05-15 DIAGNOSIS — L309 Dermatitis, unspecified: Secondary | ICD-10-CM | POA: Diagnosis not present

## 2021-05-15 DIAGNOSIS — Z85828 Personal history of other malignant neoplasm of skin: Secondary | ICD-10-CM | POA: Diagnosis not present

## 2021-05-15 DIAGNOSIS — L821 Other seborrheic keratosis: Secondary | ICD-10-CM | POA: Diagnosis not present

## 2021-06-04 ENCOUNTER — Other Ambulatory Visit: Payer: Self-pay | Admitting: Cardiology

## 2021-06-04 DIAGNOSIS — E7800 Pure hypercholesterolemia, unspecified: Secondary | ICD-10-CM

## 2021-07-09 ENCOUNTER — Other Ambulatory Visit: Payer: Self-pay | Admitting: Cardiology

## 2021-07-09 DIAGNOSIS — E782 Mixed hyperlipidemia: Secondary | ICD-10-CM

## 2021-08-07 ENCOUNTER — Other Ambulatory Visit: Payer: Self-pay | Admitting: Cardiology

## 2021-09-26 ENCOUNTER — Telehealth: Payer: Self-pay

## 2021-09-26 DIAGNOSIS — S86211A Strain of muscle(s) and tendon(s) of anterior muscle group at lower leg level, right leg, initial encounter: Secondary | ICD-10-CM | POA: Diagnosis not present

## 2021-09-26 DIAGNOSIS — S86911A Strain of unspecified muscle(s) and tendon(s) at lower leg level, right leg, initial encounter: Secondary | ICD-10-CM | POA: Diagnosis not present

## 2021-09-26 DIAGNOSIS — S8011XA Contusion of right lower leg, initial encounter: Secondary | ICD-10-CM | POA: Diagnosis not present

## 2021-09-26 DIAGNOSIS — Z7901 Long term (current) use of anticoagulants: Secondary | ICD-10-CM | POA: Diagnosis not present

## 2021-09-26 NOTE — Telephone Encounter (Signed)
Pt stated that his calf is swollen and one of the doctors at his tennis court told him he should get it looked at. Unfortunately, the patient is in [place] and is unable to upload a photo or come into the office. Advised patient to see a doctor in [place] to get further evaluated.

## 2021-11-29 DIAGNOSIS — Z23 Encounter for immunization: Secondary | ICD-10-CM | POA: Diagnosis not present

## 2021-12-25 ENCOUNTER — Ambulatory Visit: Payer: Medicare Other

## 2021-12-25 DIAGNOSIS — I6523 Occlusion and stenosis of bilateral carotid arteries: Secondary | ICD-10-CM | POA: Diagnosis not present

## 2022-01-02 ENCOUNTER — Ambulatory Visit: Payer: Medicare Other | Admitting: Cardiology

## 2022-01-02 ENCOUNTER — Encounter: Payer: Self-pay | Admitting: Cardiology

## 2022-01-02 VITALS — BP 109/62 | HR 82 | Temp 98.2°F | Resp 16 | Ht 70.0 in | Wt 184.0 lb

## 2022-01-02 DIAGNOSIS — I25118 Atherosclerotic heart disease of native coronary artery with other forms of angina pectoris: Secondary | ICD-10-CM

## 2022-01-02 DIAGNOSIS — E7800 Pure hypercholesterolemia, unspecified: Secondary | ICD-10-CM

## 2022-01-02 DIAGNOSIS — I10 Essential (primary) hypertension: Secondary | ICD-10-CM | POA: Diagnosis not present

## 2022-01-02 DIAGNOSIS — I6523 Occlusion and stenosis of bilateral carotid arteries: Secondary | ICD-10-CM

## 2022-01-02 DIAGNOSIS — I480 Paroxysmal atrial fibrillation: Secondary | ICD-10-CM

## 2022-01-02 NOTE — Progress Notes (Signed)
EKG

## 2022-01-02 NOTE — Progress Notes (Signed)
Primary Physician/Referring:  Alroy Dust, L.Marlou Sa, MD  Patient ID: Cody Hodge, male    DOB: Dec 19, 1945, 76 y.o.   MRN: [id number]  Chief Complaint  Patient presents with   Atrial Fibrillation   Coronary Artery Disease   carotid stenosis    6 month   HPI:    Cody Hodge  is a 76 y.o.  male  with  paroxysmal atrial fibrillation, hyperlipidemia, asymptomatic bilateral carotid artery stenosis, CAD S/P coronary angiography on 03/17/2017 and underwent stenting to his very large PL branch of RCA and has residual; D1 and D2 stenosis recommended medical therapy due to anatomy and an occluded small circumflex OM-1, now presents for follow-up. Of note, patient lives in [place] during winters and here in [place] during most other months.  He presents here for a 28-monthoffice visit, remains asymptomatic, continues to play tennis, in fact played tennis for 3 hours this morning.  He is tolerating anticoagulation without bleeding diathesis.  Past Medical History:  Diagnosis Date   Arthritis    BPH (benign prostatic hyperplasia)    Coronary artery disease 09/2014   50% mid to distal LAD, 50-70% ostial first diag, 40% ostial and mid left circ.   ED (erectile dysfunction)    Hemorrhoids    associated with bowel movements   HTN (hypertension)    Hypercholesteremia    Knee arthropathy    Melanoma (HCC)    Melanoma (HCC)    Mixed hyperlipidemia    OSA (obstructive sleep apnea)    mild to moderate refused CPAP   PAF (paroxysmal atrial fibrillation) ([facility]    CHADS VASC score 2 (age>65 and HTN)   Postoperative anemia due to acute blood loss 04/03/2015   Tremor    Past Surgical History:  Procedure Laterality Date   CARDIAC CATHETERIZATION     2'16- only ever heart cath   CATARACT EXTRACTION, BILATERAL     COLONOSCOPY N/A 02/23/2021   Procedure: COLONOSCOPY;  Surgeon: OArta Silence MD;  [facility]  Service: Endoscopy;  Laterality: N/A;   CORONARY STENT INTERVENTION N/A 03/18/2017    Procedure: CORONARY STENT INTERVENTION;  Surgeon: PNigel Mormon MD;  [facility];  Service: Cardiovascular;  Laterality: N/A;   HEMORROIDECTOMY     HYDROCELE EXCISION / REPAIR     LASIK     left and right arthroscopy     left forearm melanoma     LEFT HEART CATH AND CORONARY ANGIOGRAPHY N/A 03/18/2017   Procedure: Left Heart Cath and Coronary Angiography;  Surgeon: PNigel Mormon MD;  [facility];  Service: Cardiovascular;  Laterality: N/A;   LEFT HEART CATHETERIZATION WITH CORONARY ANGIOGRAM N/A 10/06/2014   Procedure: LEFT HEART CATHETERIZATION WITH CORONARY ANGIOGRAM;  Surgeon: HSinclair Grooms MD;  [facility];  Service: Cardiovascular;  Laterality: N/A;   TOTAL KNEE ARTHROPLASTY Bilateral 03/29/2015   Procedure: TOTAL KNEE BILATERAL;  Surgeon: FGaynelle Arabian MD;  [facility];  Service: Orthopedics;  Laterality: Bilateral;  spinal anes. placed in OR   Family History  Problem Relation Age of Onset   Dementia Mother    Heart attack Father    Heart disease Father    Alzheimer's disease Sister     Social History   Tobacco Use   Smoking status: Never   Smokeless tobacco: Never  Substance Use Topics   Alcohol use: Yes    Comment: 4 drinks per week    Marital Status: Married  ROS  Review  of Systems  Cardiovascular:  Negative for dyspnea on exertion, leg swelling and syncope.  Respiratory:  Negative for shortness of breath.   Musculoskeletal:  Positive for joint pain. Negative for joint swelling.  Gastrointestinal:  Positive for hemorrhoids (chronic). Negative for melena.  Objective  Blood pressure 109/62, pulse 82, temperature 98.2 F (36.8 C), resp. rate 16, height _0  (1.778 m), weight 184 lb (83.5 kg), SpO2 97 %.     01/02/2022    1:10 PM 04/25/2021    3:04 PM 03/05/2021   12:18 PM  Vitals with BMI  Height _1  _2    Weight 184 lbs 184 lbs   BMI 37.3 42.8   Systolic 768 115 726  Diastolic 62 73 74   Pulse 82 71      Physical Exam Constitutional:      General: He is not in acute distress.    Appearance: He is well-developed.  Neck:     Thyroid: No thyromegaly.     Vascular: No JVD.  Cardiovascular:     Rate and Rhythm: Normal rate and regular rhythm.     Pulses: Normal pulses and intact distal pulses.          Carotid pulses are  on the right side with bruit and  on the left side with bruit.    Heart sounds: No murmur heard.   No gallop.  Pulmonary:     Effort: No accessory muscle usage.     Breath sounds: Normal breath sounds.  Abdominal:     General: Bowel sounds are normal.     Palpations: Abdomen is soft.  Musculoskeletal:        General: No swelling. Normal range of motion.   Laboratory examination:   Recent Labs    02/21/21 1715 02/22/21 0225  NA 137 137  K 4.1 4.1  CL 103 104  CO2 29 27  GLUCOSE 80 96  BUN 27* 24*  CREATININE 1.21 1.06  CALCIUM 9.2 8.9  GFRNONAA >60 >60   CrCl cannot be calculated (Patient's most recent lab result is older than the maximum 21 days allowed.).     Latest Ref Rng & Units 02/22/2021    2:25 AM 02/21/2021    5:15 PM 12/23/2019    9:15 AM  CMP  Glucose 70 - 99 mg/dL 96   80   88    BUN 8 - 23 mg/dL _3 Creatinine 0.61 - 1.24 mg/dL 1.06   1.21   1.17    Sodium 135 - 145 mmol/L 137   137   139    Potassium 3.5 - 5.1 mmol/L 4.1   4.1   5.2    Chloride 98 - 111 mmol/L 104   103   103    CO2 22 - 32 mmol/L _4 Calcium 8.9 - 10.3 mg/dL 8.9   9.2   9.4    Total Protein 6.0 - 8.5 g/dL   6.6    Total Bilirubin 0.0 - 1.2 mg/dL   0.4    Alkaline Phos 39 - 117 IU/L   128    AST 0 - 40 IU/L   41    ALT 0 - 44 IU/L   54        Latest Ref Rng & Units 02/22/2021   11:35 PM 02/22/2021   11:15 AM 02/21/2021   11:07 PM  CBC  WBC  4.0 - 10.5 K/uL 7.9   8.3   9.0    Hemoglobin 13.0 - 17.0 g/dL 13.8   13.3   13.3    Hematocrit 39.0 - 52.0 % 42.6   41.6   41.1    Platelets 150 - 400 K/uL 293   290   279      Lipid Panel No results for input(s): CHOL, TRIG, LDLCALC, VLDL, HDL, CHOLHDL, LDLDIRECT in the last 8760 hours.   HEMOGLOBIN A1C Lab Results  Component Value Date   HGBA1C 5.3 12/23/2019   TSH No results for input(s): TSH in the last 8760 hours.  Medications and allergies  No Known Allergies   Current Outpatient Medications:    atorvastatin (LIPITOR) 20 MG tablet, TAKE 1 TABLET DAILY IN THE MORNING, Disp: 90 tablet, Rfl: 3   cholecalciferol (VITAMIN D) 1000 units tablet, Take 1,000 Units by mouth daily., Disp: , Rfl:    Coenzyme Q10 (CO Q-10) 200 MG CAPS, Take 200 mg by mouth daily., Disp: , Rfl:    ELIQUIS 5 MG TABS tablet, TAKE 1 TABLET TWICE A DAY, Disp: 180 tablet, Rfl: 3   ezetimibe (ZETIA) 10 MG tablet, TAKE 1 TABLET DAILY, Disp: 90 tablet, Rfl: 3   ferrous sulfate 325 (65 FE) MG tablet, Take 325 mg by mouth daily., Disp: , Rfl:    finasteride (PROSCAR) 5 MG tablet, Take 5 mg by mouth daily. , Disp: , Rfl:    ibuprofen (ADVIL,MOTRIN) 200 MG tablet, Take 400 mg by mouth 2 (two) times daily as needed for moderate pain., Disp: , Rfl:    metoprolol succinate (TOPROL-XL) 25 MG 24 hr tablet, TAKE 1 TABLET DAILY (SCHEDULE APPOINTMENT FOR REFILLS), Disp: 90 tablet, Rfl: 3   Multiple Vitamin (MULTIVITAMIN WITH MINERALS) TABS tablet, Take 1 tablet by mouth daily., Disp: , Rfl:    OVER THE COUNTER MEDICATION, Take 2 capsules by mouth every morning. elysium, Disp: , Rfl:    Polyethyl Glycol-Propyl Glycol (SYSTANE OP), Place 1 drop into both eyes 2 (two) times daily. , Disp: , Rfl:    ramipril (ALTACE) 5 MG capsule, TAKE 1 CAPSULE DAILY, Disp: 90 capsule, Rfl: 3   Resveratrol 250 MG CAPS, Take 250 mg by mouth 2 (two) times daily., Disp: , Rfl:    triamcinolone cream (KENALOG) 0.1 %, Apply 1 application topically as needed (rash)., Disp: , Rfl:    TURMERIC PO, Take 1 capsule by mouth 2 (two) times daily. , Disp: , Rfl:   Radiology:   CT angiography of the neck 03/01/2020: 1. 65-70  stenosis of the proximal left internal carotid artery relative to the more distal vessel. Stenosis is 1.4 cm from the bifurcation. 2. Atherosclerotic changes at the right carotid bifurcation without significant stenosis. 3. Atherosclerotic changes at the origins the great vessels and in the distal left V1 segment without significant stenosis. 4. Multilevel spondylosis of the cervical spine as described.  Cardiac Studies:   Coronary angiogram 03/18/2017 : RCA Large PL-2 90% stenosis, stenting with SYNERGY 3.0 X 28 mm DES. Left main: Distal 20% stenosis. LAD: D-1 & D-2 80% proximal stenoses, not suitable for coronary intervention due to acute angle unless symptoms. Cx Small nondominant vessel with ostial 90% stenosis, followed by subtotal Mid OM-1.  Echocardiogram 01/23/2017: Left ventricle cavity is normal in size. Mild asymmetric hypertrophy of the left ventricle. Normal global wall motion. Visual EF is 50-55%. Normal diastolic filling pattern, normal LAP. Left atrial cavity is mildly dilated at 4.2 cm. Trace tricuspid regurgitation.  Mild pulmonary hypertension. Pulmonary artery systolic pressure is estimated at 33 mm Hg. IVC is minimally dilated with blunted respiratory response. May suggest elevated central venous pressure.  Compared to 07/70/2016, mild pulmonary hypertension new.  Vascular Ultrasound Lower Extremity Venous 11/20/2017: Right: Probable acute DVT in the proximal and mid peroneal veins.  Pulsatile venous flow noted in the CFV. SFJ, DFV, and popliteal vein.  Interstitial fluid noted posterior knee.  Left: No evidence of common femoral vein obstruction.  Carotid artery duplex 12/25/2021: Duplex suggests stenosis in the right internal carotid artery (1-15%). Duplex suggests stenosis in the left internal carotid artery (>=70%). The left PSV internal/common carotid artery ratio of 4.3 is consistent with a stenosis of >70%. Antegrade right vertebral artery flow. Antegrade left  vertebral artery flow. The study done on 01/05/2021, no change in stenosis severity, however velocity has decreased from 4 9 2/158 cm/s to 265/86 cm/s.  Follow-up in 6 months.  EKG  EKG 01/02/2022: Normal sinus rhythm at rate of 83 bpm, left atrial enlargement, no evidence of ischemia, otherwise normal EKG.  No significant change from 01/11/2021.  Assessment     ICD-10-CM   1. PAF (paroxysmal atrial fibrillation) (HCC)  I48.0 EKG 12-Lead    TSH    2. Coronary artery disease of native artery of native heart with stable angina pectoris (HCC)  I25.118 Lipoprotein A (LPA)    Lipid Panel With LDL/HDL Ratio    CMP14+EGFR    CBC    3. Essential hypertension  I10 TSH    4. Asymptomatic bilateral carotid artery stenosis  I65.23 PCV CAROTID DUPLEX (BILATERAL)    5. Hypercholesteremia  E78.00 Lipoprotein A (LPA)    Lipid Panel With LDL/HDL Ratio       No orders of the defined types were placed in this encounter.   There are no discontinued medications.  Recommendations:   Cody Hodge  is a 76 y.o.  male  with  paroxysmal atrial fibrillation, hyperlipidemia, asymptomatic bilateral carotid artery stenosis, CAD S/P coronary angiography on 03/17/2017 and underwent stenting to his very large PL branch of RCA and has residual; D1 and D2 stenosis recommended medical therapy due to anatomy and an occluded small circumflex OM-1, now presents for follow-up. Of note, patient lives in [place] during winters and here in [place] during most other months.  He presents here for a 3-monthoffice visit, remains asymptomatic, continues to play tennis, in fact played tennis for 3 hours this morning.  He is tolerating anticoagulation for atrial fibrillation and is maintaining sinus rhythm, on his last office visit I discontinued flecainide in view of underlying coronary artery disease.  He remains angina free.  With regard to blood pressure, well controlled.  He has bilateral asymptomatic carotid artery stenosis, mild  on the right and moderate to severe on the left, although >70% by duplex ultrasound, CT angiogram reviewed about a 70 to 75% stenosis at most and has been evaluated by vascular surgery.  As the velocity is stable, we will continue 6 monthly surveillance.  He does need routine labs, orders were placed.  I will make sure Dr. DDonnie Coffinwill get a copy.  Otherwise stable from cardiac standpoint, I will see him back in 6 months.   JAdrian Prows MD, [facility]5/24/2023, 1:33 PM Office: [phone]: [phone]: [phone]

## 2022-01-09 DIAGNOSIS — I25118 Atherosclerotic heart disease of native coronary artery with other forms of angina pectoris: Secondary | ICD-10-CM | POA: Diagnosis not present

## 2022-01-09 DIAGNOSIS — E7800 Pure hypercholesterolemia, unspecified: Secondary | ICD-10-CM | POA: Diagnosis not present

## 2022-01-09 DIAGNOSIS — I10 Essential (primary) hypertension: Secondary | ICD-10-CM | POA: Diagnosis not present

## 2022-01-09 DIAGNOSIS — I480 Paroxysmal atrial fibrillation: Secondary | ICD-10-CM | POA: Diagnosis not present

## 2022-01-10 LAB — CMP14+EGFR
ALT: 57 IU/L — ABNORMAL HIGH (ref 0–44)
AST: 38 IU/L (ref 0–40)
Albumin/Globulin Ratio: 2 (ref 1.2–2.2)
Albumin: 4.4 g/dL (ref 3.7–4.7)
Alkaline Phosphatase: 123 IU/L — ABNORMAL HIGH (ref 44–121)
BUN/Creatinine Ratio: 22 (ref 10–24)
BUN: 24 mg/dL (ref 8–27)
Bilirubin Total: 0.5 mg/dL (ref 0.0–1.2)
CO2: 25 mmol/L (ref 20–29)
Calcium: 9.3 mg/dL (ref 8.6–10.2)
Chloride: 100 mmol/L (ref 96–106)
Creatinine, Ser: 1.07 mg/dL (ref 0.76–1.27)
Globulin, Total: 2.2 g/dL (ref 1.5–4.5)
Glucose: 94 mg/dL (ref 70–99)
Potassium: 4.7 mmol/L (ref 3.5–5.2)
Sodium: 139 mmol/L (ref 134–144)
Total Protein: 6.6 g/dL (ref 6.0–8.5)
eGFR: 72 mL/min/{1.73_m2} (ref >59)

## 2022-01-10 LAB — LIPOPROTEIN A (LPA): Lipoprotein (a): 41.1 nmol/L (ref <75.0)

## 2022-01-10 LAB — CBC
Hematocrit: 40.7 % (ref 37.5–51.0)
Hemoglobin: 13.8 g/dL (ref 13.0–17.7)
MCH: 28.4 pg (ref 26.6–33.0)
MCHC: 33.9 g/dL (ref 31.5–35.7)
MCV: 84 fL (ref 79–97)
Platelets: 258 10*3/uL (ref 150–450)
RBC: 4.86 x10E6/uL (ref 4.14–5.80)
RDW: 13 % (ref 11.6–15.4)
WBC: 7.6 10*3/uL (ref 3.4–10.8)

## 2022-01-10 LAB — LIPID PANEL WITH LDL/HDL RATIO
Cholesterol, Total: 108 mg/dL (ref 100–199)
HDL: 43 mg/dL (ref >39)
LDL Chol Calc (NIH): 49 mg/dL (ref 0–99)
LDL/HDL Ratio: 1.1 ratio (ref 0.0–3.6)
Triglycerides: 76 mg/dL (ref 0–149)
VLDL Cholesterol Cal: 16 mg/dL (ref 5–40)

## 2022-01-10 LAB — TSH: TSH: 1.22 u[IU]/mL (ref 0.450–4.500)

## 2022-02-15 ENCOUNTER — Other Ambulatory Visit: Payer: Self-pay | Admitting: Cardiology

## 2022-04-03 DIAGNOSIS — R972 Elevated prostate specific antigen [PSA]: Secondary | ICD-10-CM | POA: Diagnosis not present

## 2022-04-08 ENCOUNTER — Other Ambulatory Visit: Payer: Self-pay | Admitting: Cardiology

## 2022-04-08 ENCOUNTER — Other Ambulatory Visit: Payer: Self-pay

## 2022-04-08 MED ORDER — APIXABAN 5 MG PO TABS: 5.0000 mg | ORAL_TABLET | Freq: Two times a day (BID) | ORAL | 3 refills | Status: DC

## 2022-04-10 DIAGNOSIS — R972 Elevated prostate specific antigen [PSA]: Secondary | ICD-10-CM | POA: Diagnosis not present

## 2022-04-10 DIAGNOSIS — N401 Enlarged prostate with lower urinary tract symptoms: Secondary | ICD-10-CM | POA: Diagnosis not present

## 2022-04-10 DIAGNOSIS — R351 Nocturia: Secondary | ICD-10-CM | POA: Diagnosis not present

## 2022-04-17 DIAGNOSIS — Z23 Encounter for immunization: Secondary | ICD-10-CM | POA: Diagnosis not present

## 2022-05-14 DIAGNOSIS — Z9889 Other specified postprocedural states: Secondary | ICD-10-CM | POA: Diagnosis not present

## 2022-05-14 DIAGNOSIS — H04123 Dry eye syndrome of bilateral lacrimal glands: Secondary | ICD-10-CM | POA: Diagnosis not present

## 2022-05-14 DIAGNOSIS — H43812 Vitreous degeneration, left eye: Secondary | ICD-10-CM | POA: Diagnosis not present

## 2022-05-14 DIAGNOSIS — H26492 Other secondary cataract, left eye: Secondary | ICD-10-CM | POA: Diagnosis not present

## 2022-05-14 DIAGNOSIS — Z961 Presence of intraocular lens: Secondary | ICD-10-CM | POA: Diagnosis not present

## 2022-05-14 DIAGNOSIS — H34232 Retinal artery branch occlusion, left eye: Secondary | ICD-10-CM | POA: Diagnosis not present

## 2022-05-16 DIAGNOSIS — L570 Actinic keratosis: Secondary | ICD-10-CM | POA: Diagnosis not present

## 2022-05-16 DIAGNOSIS — L821 Other seborrheic keratosis: Secondary | ICD-10-CM | POA: Diagnosis not present

## 2022-05-16 DIAGNOSIS — D1801 Hemangioma of skin and subcutaneous tissue: Secondary | ICD-10-CM | POA: Diagnosis not present

## 2022-05-16 DIAGNOSIS — D225 Melanocytic nevi of trunk: Secondary | ICD-10-CM | POA: Diagnosis not present

## 2022-05-16 DIAGNOSIS — L738 Other specified follicular disorders: Secondary | ICD-10-CM | POA: Diagnosis not present

## 2022-05-16 DIAGNOSIS — Z85828 Personal history of other malignant neoplasm of skin: Secondary | ICD-10-CM | POA: Diagnosis not present

## 2022-05-24 DIAGNOSIS — Z0000 Encounter for general adult medical examination without abnormal findings: Secondary | ICD-10-CM | POA: Diagnosis not present

## 2022-05-24 DIAGNOSIS — E7800 Pure hypercholesterolemia, unspecified: Secondary | ICD-10-CM | POA: Diagnosis not present

## 2022-05-24 DIAGNOSIS — K648 Other hemorrhoids: Secondary | ICD-10-CM | POA: Diagnosis not present

## 2022-05-24 DIAGNOSIS — M1990 Unspecified osteoarthritis, unspecified site: Secondary | ICD-10-CM | POA: Diagnosis not present

## 2022-05-27 ENCOUNTER — Other Ambulatory Visit: Payer: Self-pay | Admitting: Cardiology

## 2022-05-27 DIAGNOSIS — E7800 Pure hypercholesterolemia, unspecified: Secondary | ICD-10-CM

## 2022-05-28 ENCOUNTER — Ambulatory Visit: Payer: Medicare Other

## 2022-05-28 DIAGNOSIS — I6523 Occlusion and stenosis of bilateral carotid arteries: Secondary | ICD-10-CM

## 2022-06-03 ENCOUNTER — Ambulatory Visit: Payer: Medicare Other | Admitting: Cardiology

## 2022-06-03 ENCOUNTER — Encounter: Payer: Self-pay | Admitting: Cardiology

## 2022-06-03 VITALS — BP 113/66 | HR 68 | Temp 98.4°F | Resp 16 | Ht 70.0 in | Wt 187.0 lb

## 2022-06-03 DIAGNOSIS — I25118 Atherosclerotic heart disease of native coronary artery with other forms of angina pectoris: Secondary | ICD-10-CM | POA: Diagnosis not present

## 2022-06-03 DIAGNOSIS — I10 Essential (primary) hypertension: Secondary | ICD-10-CM | POA: Diagnosis not present

## 2022-06-03 DIAGNOSIS — I480 Paroxysmal atrial fibrillation: Secondary | ICD-10-CM

## 2022-06-03 DIAGNOSIS — I6523 Occlusion and stenosis of bilateral carotid arteries: Secondary | ICD-10-CM

## 2022-06-03 NOTE — Telephone Encounter (Signed)
error 

## 2022-06-03 NOTE — Progress Notes (Signed)
Primary Physician/Referring:  Alroy Dust, L.Marlou Sa, MD  Patient ID: Cody Hodge, male    DOB: October 07, 1945, 76 y.o.   MRN: [id number]  Chief Complaint  Patient presents with   Coronary Artery Disease   PAF   Hypertension   Hyperlipidemia   Follow-up    6 months   HPI:    Cody Hodge  is a 76 y.o. male with paroxysmal atrial fibrillation, hyperlipidemia, asymptomatic bilateral carotid artery stenosis, CAD S/P coronary angiography on 03/17/2017 and underwent stenting to his very large PL branch of RCA and has residual; D1 and D2 stenosis recommended medical therapy due to anatomy and an occluded small circumflex OM-1, now presents for follow-up. Of note, patient lives in [place] during winters and here in [place] during most other months.  He presents here for a 66-monthoffice visit, remains asymptomatic, continues to play tennis regularly. He is tolerating anticoagulation for atrial fibrillation and is maintaining sinus rhythm.  He remains angina free.  Past Medical History:  Diagnosis Date   Arthritis    BPH (benign prostatic hyperplasia)    Coronary artery disease 09/2014   50% mid to distal LAD, 50-70% ostial first diag, 40% ostial and mid left circ.   ED (erectile dysfunction)    Hemorrhoids    associated with bowel movements   HTN (hypertension)    Hypercholesteremia    Knee arthropathy    Melanoma (HCC)    Melanoma (HCC)    Mixed hyperlipidemia    OSA (obstructive sleep apnea)    mild to moderate refused CPAP   PAF (paroxysmal atrial fibrillation) (HCC)    CHADS VASC score 2 (age>65 and HTN)   Postoperative anemia due to acute blood loss 04/03/2015   Tremor    Social History   Tobacco Use   Smoking status: Never   Smokeless tobacco: Never  Substance Use Topics   Alcohol use: Yes    Comment: 4 drinks per week    Marital Status: Married  ROS  Review of Systems  Cardiovascular:  Negative for chest pain, dyspnea on exertion and leg swelling.   Objective  Blood  pressure 113/66, pulse 68, temperature 98.4 F (36.9 C), temperature source Temporal, resp. rate 16, height '5\' 10"'$  (1.778 m), weight 187 lb (84.8 kg), SpO2 92 %.     06/03/2022    3:03 PM 01/02/2022    1:10 PM 04/25/2021    3:04 PM  Vitals with BMI  Height '5\' 10"'$  '5\' 10"'$  '5\' 10"'$   Weight 187 lbs 184 lbs 184 lbs  BMI 26.83 265.6281.2 Systolic [id number] Diastolic 66 62 73  Pulse 68 82 71     Physical Exam Neck:     Vascular: Carotid bruit (Bilateral) present. No JVD.  Cardiovascular:     Rate and Rhythm: Normal rate and regular rhythm.     Pulses: Intact distal pulses.     Heart sounds: Normal heart sounds. No murmur heard.    No gallop.  Pulmonary:     Effort: Pulmonary effort is normal.     Breath sounds: Normal breath sounds.  Abdominal:     General: Bowel sounds are normal.     Palpations: Abdomen is soft.  Musculoskeletal:     Right lower leg: No edema.     Left lower leg: No edema.    Laboratory examination:   Recent Labs    01/09/22 1450  NA 139  K 4.7  CL 100  CO2 25  GLUCOSE 94  BUN  24  CREATININE 1.07  CALCIUM 9.3   CrCl cannot be calculated (Patient's most recent lab result is older than the maximum 21 days allowed.).     Latest Ref Rng & Units 01/09/2022    2:50 PM 02/22/2021    2:25 AM 02/21/2021    5:15 PM  CMP  Glucose 70 - 99 mg/dL 94  96  80   BUN 8 - 27 mg/dL '24  24  27   '$ Creatinine 0.76 - 1.27 mg/dL 1.07  1.06  1.21   Sodium 134 - 144 mmol/L 139  137  137   Potassium 3.5 - 5.2 mmol/L 4.7  4.1  4.1   Chloride 96 - 106 mmol/L 100  104  103   CO2 20 - 29 mmol/L '25  27  29   '$ Calcium 8.6 - 10.2 mg/dL 9.3  8.9  9.2   Total Protein 6.0 - 8.5 g/dL 6.6     Total Bilirubin 0.0 - 1.2 mg/dL 0.5     Alkaline Phos 44 - 121 IU/L 123     AST 0 - 40 IU/L 38     ALT 0 - 44 IU/L 57         Latest Ref Rng & Units 01/09/2022    2:50 PM 02/22/2021   11:35 PM 02/22/2021   11:15 AM  CBC  WBC 3.4 - 10.8 x10E3/uL 7.6  7.9  8.3   Hemoglobin 13.0 - 17.7 g/dL  13.8  13.8  13.3   Hematocrit 37.5 - 51.0 % 40.7  42.6  41.6   Platelets 150 - 450 x10E3/uL 258  293  290    Lipid Panel Recent Labs    01/09/22 1450  CHOL 108  TRIG 76  LDLCALC 49  HDL 43     HEMOGLOBIN A1C Lab Results  Component Value Date   HGBA1C 5.3 12/23/2019   TSH Recent Labs    01/09/22 1450  TSH 1.220    Medications and allergies  No Known Allergies   Current Outpatient Medications:    apixaban (ELIQUIS) 5 MG TABS tablet, Take 1 tablet (5 mg total) by mouth 2 (two) times daily., Disp: 180 tablet, Rfl: 3   atorvastatin (LIPITOR) 20 MG tablet, TAKE 1 TABLET DAILY IN THE MORNING, Disp: 90 tablet, Rfl: 3   cholecalciferol (VITAMIN D) 1000 units tablet, Take 1,000 Units by mouth daily., Disp: , Rfl:    Coenzyme Q10 (CO Q-10) 200 MG CAPS, Take 200 mg by mouth daily., Disp: , Rfl:    ezetimibe (ZETIA) 10 MG tablet, TAKE 1 TABLET DAILY, Disp: 90 tablet, Rfl: 3   ferrous sulfate 325 (65 FE) MG tablet, Take 325 mg by mouth daily., Disp: , Rfl:    finasteride (PROSCAR) 5 MG tablet, Take 5 mg by mouth daily. , Disp: , Rfl:    ibuprofen (ADVIL,MOTRIN) 200 MG tablet, Take 400 mg by mouth 2 (two) times daily as needed for moderate pain., Disp: , Rfl:    metoprolol succinate (TOPROL-XL) 25 MG 24 hr tablet, TAKE 1 TABLET DAILY (SCHEDULE APPOINTMENT FOR REFILLS), Disp: 90 tablet, Rfl: 3   Multiple Vitamin (MULTIVITAMIN WITH MINERALS) TABS tablet, Take 1 tablet by mouth daily., Disp: , Rfl:    OVER THE COUNTER MEDICATION, Take 2 capsules by mouth every morning. elysium, Disp: , Rfl:    Polyethyl Glycol-Propyl Glycol (SYSTANE OP), Place 1 drop into both eyes 2 (two) times daily. , Disp: , Rfl:    ramipril (ALTACE) 5 MG capsule, TAKE 1  CAPSULE DAILY, Disp: 90 capsule, Rfl: 3   Resveratrol 250 MG CAPS, Take 250 mg by mouth 2 (two) times daily., Disp: , Rfl:    triamcinolone cream (KENALOG) 0.1 %, Apply 1 application topically as needed (rash)., Disp: , Rfl:    TURMERIC PO, Take 1  capsule by mouth 2 (two) times daily. , Disp: , Rfl:   Radiology:   CT angiography of the neck 03/01/2020: 1. 65-70 stenosis of the proximal left internal carotid artery relative to the more distal vessel. Stenosis is 1.4 cm from the bifurcation. 2. Atherosclerotic changes at the right carotid bifurcation without significant stenosis. 3. Atherosclerotic changes at the origins the great vessels and in the distal left V1 segment without significant stenosis. 4. Multilevel spondylosis of the cervical spine as described.  Cardiac Studies:   Coronary angiogram 03/18/2017 : RCA Large PL-2 90% stenosis, stenting with SYNERGY 3.0 X 28 mm DES. Left main: Distal 20% stenosis. LAD: D-1 & D-2 80% proximal stenoses, not suitable for coronary intervention due to acute angle unless symptoms. Cx Small nondominant vessel with ostial 90% stenosis, followed by subtotal Mid OM-1.  Echocardiogram 01/23/2017: Left ventricle cavity is normal in size. Mild asymmetric hypertrophy of the left ventricle. Normal global wall motion. Visual EF is 50-55%. Normal diastolic filling pattern, normal LAP. Left atrial cavity is mildly dilated at 4.2 cm. Trace tricuspid regurgitation. Mild pulmonary hypertension. Pulmonary artery systolic pressure is estimated at 33 mm Hg. IVC is minimally dilated with blunted respiratory response. May suggest elevated central venous pressure.  Compared to 07/70/2016, mild pulmonary hypertension new.  Vascular Ultrasound Lower Extremity Venous 11/20/2017: Right: Probable acute DVT in the proximal and mid peroneal veins.  Pulsatile venous flow noted in the CFV. SFJ, DFV, and popliteal vein.  Interstitial fluid noted posterior knee.  Left: No evidence of common femoral vein obstruction.  Carotid artery duplex 05/28/2022: Duplex suggests stenosis in the right internal carotid artery (1-15%). Duplex suggests stenosis in the right external carotid artery (<50%). Duplex suggests stenosis in the left  internal carotid artery (50-69%). Antegrade right vertebral artery flow. Antegrade left vertebral artery flow. Compared to the study done on 12/25/2021, left ICA velocity has decreased from 265/86 cm/s to 208/76 cm/s.  There is calcific shadowing and may underestimate stenosis severity.  Overall no significant change/mild improvement in left ICA stenosis. Follow up in six months is appropriate if clinically indicated.  EKG  EKG 01/02/2022: Normal sinus rhythm at rate of 83 bpm, left atrial enlargement, no evidence of ischemia, otherwise normal EKG.  No significant change from 01/11/2021.  Assessment     ICD-10-CM   1. PAF (paroxysmal atrial fibrillation) (HCC)  I48.0     2. Coronary artery disease of native artery of native heart with stable angina pectoris ([place])  I25.118     3. Essential hypertension  I10     4. Asymptomatic bilateral carotid artery stenosis  I65.23 PCV CAROTID DUPLEX (BILATERAL)       No orders of the defined types were placed in this encounter.   There are no discontinued medications.  Recommendations:   Nobel Brar  is a 76 y.o. male with paroxysmal atrial fibrillation, hyperlipidemia, asymptomatic bilateral carotid artery stenosis, CAD S/P coronary angiography on 03/17/2017 and underwent stenting to his very large PL branch of RCA and has residual; D1 and D2 stenosis recommended medical therapy due to anatomy and an occluded small circumflex OM-1, now presents for follow-up. Of note, patient lives in [place] during winters and here in  [place] during most other months.  He presents here for a 13-monthoffice visit, remains asymptomatic, continues to play tennis regularly. He is tolerating anticoagulation for atrial fibrillation and is maintaining sinus rhythm.  He remains angina free.  With regard to blood pressure, well controlled.  He has bilateral asymptomatic carotid artery stenosis, mild on the right and moderate to severe on the left,  CT angiogram reviewed about a  70 to 75% stenosis at most.  I reviewed his latest carotid artery duplex from October 2023, left ICA velocity has not decreased, stenosis seen in upper range between 50-69 we will continue with surveillance and see once.  Lipids are well controlled, he is on appropriate medical therapy.  I will see him back in 6 months.    JAdrian Prows MD, [facility]10/23/2023, 3:25 PM Office: [phone]: [phone]: [phone]

## 2022-07-30 DIAGNOSIS — Z23 Encounter for immunization: Secondary | ICD-10-CM | POA: Diagnosis not present

## 2022-07-31 ENCOUNTER — Telehealth: Payer: Self-pay

## 2022-07-31 NOTE — Telephone Encounter (Signed)
    Patient called from [place] send in hs EKG and mention that he has been having some chest pain since yesterday from his covid shot.   Per Dr. Shellia Carwin EKG looked normal and can take Tylenol for pain if it gets worse to go to the ER. Patient understood.

## 2022-08-19 ENCOUNTER — Encounter: Payer: Self-pay | Admitting: Cardiology

## 2022-08-19 ENCOUNTER — Telehealth: Payer: Self-pay

## 2022-08-19 NOTE — Telephone Encounter (Signed)
Sent mychart message

## 2022-08-19 NOTE — Telephone Encounter (Signed)
Patient had a Covid vaccine 2 weeks ago and had been in Afib, and was seeing black spots, but has resolved since. This past Friday patient was playing tennis and had another episode that lasted about 6 hours.  He stated that today he was not playing tennis, just sitting around the house, and his watch showed he was in Afib, but the symptoms were mild. Patient has MyChart and can receive messages in MyChart or phone call. Please see previous message from patient in chart also regarding his Covid vaccine symptoms. Please advise.

## 2022-08-20 ENCOUNTER — Other Ambulatory Visit: Payer: Self-pay | Admitting: Cardiology

## 2022-08-20 DIAGNOSIS — E782 Mixed hyperlipidemia: Secondary | ICD-10-CM

## 2022-10-20 ENCOUNTER — Encounter: Payer: Self-pay | Admitting: Cardiology

## 2022-10-21 ENCOUNTER — Other Ambulatory Visit: Payer: Self-pay

## 2022-10-21 MED ORDER — METOPROLOL SUCCINATE ER 25 MG PO TB24: 25.0000 mg | ORAL_TABLET | Freq: Every day | ORAL | 1 refills | Status: DC

## 2022-12-31 ENCOUNTER — Ambulatory Visit: Payer: Medicare Other

## 2022-12-31 DIAGNOSIS — I6523 Occlusion and stenosis of bilateral carotid arteries: Secondary | ICD-10-CM

## 2023-01-06 NOTE — Progress Notes (Signed)
Carotid artery duplex 12/31/2022: Duplex suggests stenosis in the right internal carotid artery (1-15%). Duplex suggests stenosis in the left internal carotid artery (>=70%). There is moderate soft plaque noted in the bilateral carotid arteries.  No significant change since 05/28/2022. Follow up in six months is appropriate if clinically indicated.

## 2023-01-07 ENCOUNTER — Ambulatory Visit: Payer: Medicare Other | Admitting: Cardiology

## 2023-01-07 ENCOUNTER — Encounter: Payer: Self-pay | Admitting: Cardiology

## 2023-01-07 VITALS — BP 130/78 | HR 62 | Ht 70.0 in | Wt 180.2 lb

## 2023-01-07 DIAGNOSIS — I2510 Atherosclerotic heart disease of native coronary artery without angina pectoris: Secondary | ICD-10-CM | POA: Diagnosis not present

## 2023-01-07 DIAGNOSIS — I6523 Occlusion and stenosis of bilateral carotid arteries: Secondary | ICD-10-CM | POA: Diagnosis not present

## 2023-01-07 DIAGNOSIS — I480 Paroxysmal atrial fibrillation: Secondary | ICD-10-CM | POA: Diagnosis not present

## 2023-01-07 NOTE — Progress Notes (Signed)
Primary Physician/Referring:  Clovis Riley, L.August Saucer, MD  Patient ID: Cody Hodge, male    DOB: March 14, 1946, 77 y.o.   MRN: [id number]  Chief Complaint  Patient presents with   Coronary artery disease of native artery of native heart wi   PAF (paroxysmal atrial fibrillation) (HCC)   Asymptomatic bilateral carotid artery stenosis   Follow-up   HPI:    Cody Hodge  is a 77 y.o. male with paroxysmal atrial fibrillation, hyperlipidemia, asymptomatic bilateral carotid artery stenosis, CAD S/P coronary angiography on 03/17/2017 and underwent stenting to his very large PL branch of RCA and has residual; D1 and D2 stenosis recommended medical therapy due to anatomy and an occluded small circumflex OM-1, now presents for follow-up. Of note, patient lives in [place] during winters and here in [place] during most other months.  He presents here for a 52-month office visit, remains asymptomatic, continues to play tennis regularly. He is tolerating anticoagulation for atrial fibrillation and is maintaining sinus rhythm.  He remains angina free.  Past Medical History:  Diagnosis Date   Arthritis    BPH (benign prostatic hyperplasia)    Coronary artery disease 09/2014   50% mid to distal LAD, 50-70% ostial first diag, 40% ostial and mid left circ.   ED (erectile dysfunction)    Hemorrhoids    associated with bowel movements   HTN (hypertension)    Hypercholesteremia    Knee arthropathy    Melanoma (HCC)    Melanoma (HCC)    Mixed hyperlipidemia    OSA (obstructive sleep apnea)    mild to moderate refused CPAP   PAF (paroxysmal atrial fibrillation) (HCC)    CHADS VASC score 2 (age>65 and HTN)   Postoperative anemia due to acute blood loss 04/03/2015   Tremor    Social History   Tobacco Use   Smoking status: Never   Smokeless tobacco: Never  Substance Use Topics   Alcohol use: Yes    Comment: 4 drinks per week    Marital Status: Married  ROS  Review of Systems  Cardiovascular:  Negative  for chest pain, dyspnea on exertion and leg swelling.   Objective  Blood pressure 130/78, pulse 62, height 5\' 10"  (1.778 m), weight 180 lb 3.2 oz (81.7 kg), SpO2 97 %.     01/07/2023   10:00 AM 06/03/2022    3:03 PM 01/02/2022    1:10 PM  Vitals with BMI  Height 5\' 10"  5\' 10"  5\' 10"   Weight 180 lbs 3 oz 187 lbs 184 lbs  BMI 25.86 26.83 26.4  Systolic 130 113 829  Diastolic 78 66 62  Pulse 62 68 82     Physical Exam Neck:     Vascular: Carotid bruit (Bilateral) present. No JVD.  Cardiovascular:     Rate and Rhythm: Normal rate and regular rhythm.     Pulses: Intact distal pulses.     Heart sounds: Normal heart sounds. No murmur heard.    No gallop.  Pulmonary:     Effort: Pulmonary effort is normal.     Breath sounds: Normal breath sounds.  Abdominal:     General: Bowel sounds are normal.     Palpations: Abdomen is soft.  Musculoskeletal:     Right lower leg: No edema.     Left lower leg: No edema.    Laboratory examination:   Recent Labs    01/09/22 1450  NA 139  K 4.7  CL 100  CO2 25  GLUCOSE 94  BUN 24  CREATININE 1.07  CALCIUM 9.3   CrCl cannot be calculated (Patient's most recent lab result is older than the maximum 21 days allowed.).     Latest Ref Rng & Units 01/09/2022    2:50 PM 02/22/2021    2:25 AM 02/21/2021    5:15 PM  CMP  Glucose 70 - 99 mg/dL 94  96  80   BUN 8 - 27 mg/dL 24  24  27    Creatinine 0.76 - 1.27 mg/dL 1.61  0.96  0.45   Sodium 134 - 144 mmol/L 139  137  137   Potassium 3.5 - 5.2 mmol/L 4.7  4.1  4.1   Chloride 96 - 106 mmol/L 100  104  103   CO2 20 - 29 mmol/L 25  27  29    Calcium 8.6 - 10.2 mg/dL 9.3  8.9  9.2   Total Protein 6.0 - 8.5 g/dL 6.6     Total Bilirubin 0.0 - 1.2 mg/dL 0.5     Alkaline Phos 44 - 121 IU/L 123     AST 0 - 40 IU/L 38     ALT 0 - 44 IU/L 57         Latest Ref Rng & Units 01/09/2022    2:50 PM 02/22/2021   11:35 PM 02/22/2021   11:15 AM  CBC  WBC 3.4 - 10.8 x10E3/uL 7.6  7.9  8.3   Hemoglobin 13.0  - 17.7 g/dL 40.9  81.1  91.4   Hematocrit 37.5 - 51.0 % 40.7  42.6  41.6   Platelets 150 - 450 x10E3/uL 258  293  290    Lipid Panel Recent Labs    01/09/22 1450  CHOL 108  TRIG 76  LDLCALC 49  HDL 43     HEMOGLOBIN A1C Lab Results  Component Value Date   HGBA1C 5.3 12/23/2019   TSH Recent Labs    01/09/22 1450  TSH 1.220    Medications and allergies  No Known Allergies   Current Outpatient Medications:    apixaban (ELIQUIS) 5 MG TABS tablet, Take 1 tablet (5 mg total) by mouth 2 (two) times daily., Disp: 180 tablet, Rfl: 3   atorvastatin (LIPITOR) 20 MG tablet, TAKE 1 TABLET DAILY IN THE MORNING, Disp: 90 tablet, Rfl: 3   cholecalciferol (VITAMIN D) 1000 units tablet, Take 1,000 Units by mouth daily., Disp: , Rfl:    Coenzyme Q10 (CO Q-10) 200 MG CAPS, Take 200 mg by mouth daily., Disp: , Rfl:    ezetimibe (ZETIA) 10 MG tablet, TAKE 1 TABLET DAILY, Disp: 90 tablet, Rfl: 3   ferrous sulfate 325 (65 FE) MG tablet, Take 325 mg by mouth daily., Disp: , Rfl:    finasteride (PROSCAR) 5 MG tablet, Take 5 mg by mouth daily. , Disp: , Rfl:    ibuprofen (ADVIL,MOTRIN) 200 MG tablet, Take 400 mg by mouth 2 (two) times daily as needed for moderate pain., Disp: , Rfl:    metoprolol succinate (TOPROL-XL) 25 MG 24 hr tablet, Take 1 tablet (25 mg total) by mouth daily. Patient may take an additional tablet daily (Patient taking differently: Take 25 mg by mouth 2 (two) times daily. Patient may take an additional tablet daily), Disp: 180 tablet, Rfl: 1   Multiple Vitamin (MULTIVITAMIN WITH MINERALS) TABS tablet, Take 1 tablet by mouth daily., Disp: , Rfl:    OVER THE COUNTER MEDICATION, Take 2 capsules by mouth every morning. elysium, Disp: , Rfl:    Polyethyl Glycol-Propyl Glycol (SYSTANE  OP), Place 1 drop into both eyes 2 (two) times daily. , Disp: , Rfl:    ramipril (ALTACE) 5 MG capsule, TAKE 1 CAPSULE DAILY, Disp: 90 capsule, Rfl: 3   Resveratrol 250 MG CAPS, Take 250 mg by mouth 2  (two) times daily., Disp: , Rfl:    triamcinolone cream (KENALOG) 0.1 %, Apply 1 application topically as needed (rash)., Disp: , Rfl:    TURMERIC PO, Take 1 capsule by mouth 2 (two) times daily. , Disp: , Rfl:   Radiology:   CT angiography of the neck 03/01/2020: 1. 65-70 stenosis of the proximal left internal carotid artery relative to the more distal vessel. Stenosis is 1.4 cm from the bifurcation. 2. Atherosclerotic changes at the right carotid bifurcation without significant stenosis. 3. Atherosclerotic changes at the origins the great vessels and in the distal left V1 segment without significant stenosis. 4. Multilevel spondylosis of the cervical spine as described.  Cardiac Studies:   Coronary angiogram 03/18/2017 : RCA Large PL-2 90% stenosis, stenting with SYNERGY 3.0 X 28 mm DES. Left main: Distal 20% stenosis. LAD: D-1 & D-2 80% proximal stenoses, not suitable for coronary intervention due to acute angle unless symptoms. Cx Small nondominant vessel with ostial 90% stenosis, followed by subtotal Mid OM-1.  Echocardiogram 01/23/2017: Left ventricle cavity is normal in size. Mild asymmetric hypertrophy of the left ventricle. Normal global wall motion. Visual EF is 50-55%. Normal diastolic filling pattern, normal LAP. Left atrial cavity is mildly dilated at 4.2 cm. Trace tricuspid regurgitation. Mild pulmonary hypertension. Pulmonary artery systolic pressure is estimated at 33 mm Hg. IVC is minimally dilated with blunted respiratory response. May suggest elevated central venous pressure.  Compared to 07/70/2016, mild pulmonary hypertension new.  Vascular Ultrasound Lower Extremity Venous 11/20/2017: Right: Probable acute DVT in the proximal and mid peroneal veins.  Pulsatile venous flow noted in the CFV. SFJ, DFV, and popliteal vein.  Interstitial fluid noted posterior knee.  Left: No evidence of common femoral vein obstruction.  Carotid artery duplex 12/31/2022: Duplex suggests  stenosis in the right internal carotid artery (1-15%). Duplex suggests stenosis in the left internal carotid artery (>=70%). There is moderate soft plaque noted in the bilateral carotid arteries.  No significant change since 05/28/2022. Follow up in six months is appropriate if clinically indicated.  EKG  EKG 01/07/2023: Normal sinus rhythm with rate of 65 bpm, normal axis, no evidence of ischemia, normal EKG.  Compared to 01/02/2022, no significant change.   Assessment     ICD-10-CM   1. Coronary artery disease involving native coronary artery of native heart without angina pectoris  I25.10 EKG 12-Lead    2. PAF (paroxysmal atrial fibrillation) (HCC)  I48.0     3. Asymptomatic bilateral carotid artery stenosis  I65.23 PCV CAROTID DUPLEX (BILATERAL)       No orders of the defined types were placed in this encounter.   There are no discontinued medications.  Recommendations:   Cody Hodge  is a 77 y.o. male with paroxysmal atrial fibrillation, hyperlipidemia, asymptomatic bilateral carotid artery stenosis, CAD S/P coronary angiography on 03/17/2017 and underwent stenting to his very large PL branch of RCA and has residual; D1 and D2 stenosis recommended medical therapy due to anatomy and an occluded small circumflex OM-1, now presents for follow-up. Of note, patient lives in [place] during winters and here in [place] during most other months.  He presents here for a 82-month office visit, remains asymptomatic.  1. Coronary artery disease of native artery of  native heart without angina pectoris (HCC) No change in his EKG, he has not had any symptoms of angina.  He has not used any sublingual nitroglycerin over the past 6 months. - EKG 12-Lead  2. PAF (paroxysmal atrial fibrillation) (HCC) He is maintaining sinus rhythm, he is tolerating anticoagulation without bleeding diathesis.  3. Asymptomatic bilateral carotid artery stenosis Patient has about a 70 to 80% stenosis in his left  carotid artery, has remained stable over the last 2-3 scans.  Stenosis is in the range of 70% to 75%, continue surveillance Dopplers every 6 months.  No indication for surgery yet.  No symptoms of TIA.  Blood pressure is well-controlled, is tolerating all his medications well, he does need complete physical examination and labs, he will be seeing Dr. Lupe Carney soon.  Lipids are well controlled, he is on appropriate medical therapy.  I will see him back in 6 months.    Cody Decamp, MD, [facility] 01/07/2023, 10:26 AM Office: [phone] Fax: [phone] Pager: [phone]

## 2023-02-10 ENCOUNTER — Other Ambulatory Visit: Payer: Self-pay | Admitting: Cardiology

## 2023-03-31 ENCOUNTER — Other Ambulatory Visit: Payer: Self-pay | Admitting: Cardiology

## 2023-03-31 DIAGNOSIS — I2510 Atherosclerotic heart disease of native coronary artery without angina pectoris: Secondary | ICD-10-CM

## 2023-03-31 NOTE — Progress Notes (Signed)
ICD-10-CM   1. Coronary artery disease involving native coronary artery of native heart without angina pectoris  I25.10 CBC    CMP14+EGFR    Lipid Panel With LDL/HDL Ratio    Lipid Panel With LDL/HDL Ratio    CMP14+EGFR    CBC

## 2023-04-01 ENCOUNTER — Other Ambulatory Visit: Payer: Self-pay | Admitting: Cardiology

## 2023-04-01 DIAGNOSIS — I2510 Atherosclerotic heart disease of native coronary artery without angina pectoris: Secondary | ICD-10-CM | POA: Diagnosis not present

## 2023-04-02 LAB — CMP14+EGFR
ALT: 47 IU/L — ABNORMAL HIGH (ref 0–44)
AST: 32 IU/L (ref 0–40)
Albumin: 4.3 g/dL (ref 3.8–4.8)
Alkaline Phosphatase: 110 IU/L (ref 44–121)
BUN/Creatinine Ratio: 30 — ABNORMAL HIGH (ref 10–24)
BUN: 30 mg/dL — ABNORMAL HIGH (ref 8–27)
Bilirubin Total: 0.5 mg/dL (ref 0.0–1.2)
CO2: 27 mmol/L (ref 20–29)
Calcium: 9.6 mg/dL (ref 8.6–10.2)
Chloride: 103 mmol/L (ref 96–106)
Creatinine, Ser: 1 mg/dL (ref 0.76–1.27)
Globulin, Total: 2.4 g/dL (ref 1.5–4.5)
Glucose: 82 mg/dL (ref 70–99)
Potassium: 5 mmol/L (ref 3.5–5.2)
Sodium: 141 mmol/L (ref 134–144)
Total Protein: 6.7 g/dL (ref 6.0–8.5)
eGFR: 78 mL/min/{1.73_m2} (ref >59)

## 2023-04-02 LAB — CBC
Hematocrit: 41.7 % (ref 37.5–51.0)
Hemoglobin: 13.6 g/dL (ref 13.0–17.7)
MCH: 28.3 pg (ref 26.6–33.0)
MCHC: 32.6 g/dL (ref 31.5–35.7)
MCV: 87 fL (ref 79–97)
Platelets: 274 10*3/uL (ref 150–450)
RBC: 4.8 x10E6/uL (ref 4.14–5.80)
RDW: 13.2 % (ref 11.6–15.4)
WBC: 8 10*3/uL (ref 3.4–10.8)

## 2023-04-02 LAB — LIPID PANEL WITH LDL/HDL RATIO
Cholesterol, Total: 119 mg/dL (ref 100–199)
HDL: 46 mg/dL (ref >39)
LDL Chol Calc (NIH): 58 mg/dL (ref 0–99)
LDL/HDL Ratio: 1.3 ratio (ref 0.0–3.6)
Triglycerides: 76 mg/dL (ref 0–149)
VLDL Cholesterol Cal: 15 mg/dL (ref 5–40)

## 2023-04-04 ENCOUNTER — Other Ambulatory Visit: Payer: Self-pay | Admitting: Oncology

## 2023-04-04 DIAGNOSIS — Z006 Encounter for examination for normal comparison and control in clinical research program: Secondary | ICD-10-CM

## 2023-04-08 DIAGNOSIS — N401 Enlarged prostate with lower urinary tract symptoms: Secondary | ICD-10-CM | POA: Diagnosis not present

## 2023-04-09 DIAGNOSIS — Z23 Encounter for immunization: Secondary | ICD-10-CM | POA: Diagnosis not present

## 2023-04-10 ENCOUNTER — Other Ambulatory Visit (HOSPITAL_COMMUNITY)
Admission: RE | Admit: 2023-04-10 | Discharge: 2023-04-10 | Disposition: A | Discharge status: Home / Self Care | Payer: Medicare Other | Source: Ambulatory Visit | Attending: Oncology | Admitting: Oncology

## 2023-04-10 DIAGNOSIS — Z006 Encounter for examination for normal comparison and control in clinical research program: Secondary | ICD-10-CM

## 2023-04-11 ENCOUNTER — Other Ambulatory Visit: Payer: Self-pay | Admitting: Medical Genetics

## 2023-04-11 DIAGNOSIS — Z006 Encounter for examination for normal comparison and control in clinical research program: Secondary | ICD-10-CM

## 2023-04-15 ENCOUNTER — Other Ambulatory Visit: Payer: Self-pay | Admitting: Oncology

## 2023-04-15 ENCOUNTER — Other Ambulatory Visit: Payer: Self-pay | Admitting: Cardiology

## 2023-04-15 DIAGNOSIS — Z139 Encounter for screening, unspecified: Secondary | ICD-10-CM

## 2023-04-16 ENCOUNTER — Other Ambulatory Visit (HOSPITAL_COMMUNITY)
Admission: RE | Admit: 2023-04-16 | Discharge: 2023-04-16 | Disposition: A | Discharge status: Home / Self Care | Payer: Self-pay | Source: Ambulatory Visit | Attending: Oncology | Admitting: Oncology

## 2023-04-16 DIAGNOSIS — R972 Elevated prostate specific antigen [PSA]: Secondary | ICD-10-CM | POA: Diagnosis not present

## 2023-04-16 DIAGNOSIS — N401 Enlarged prostate with lower urinary tract symptoms: Secondary | ICD-10-CM | POA: Diagnosis not present

## 2023-04-16 DIAGNOSIS — R351 Nocturia: Secondary | ICD-10-CM | POA: Diagnosis not present

## 2023-04-16 DIAGNOSIS — Z139 Encounter for screening, unspecified: Secondary | ICD-10-CM | POA: Insufficient documentation

## 2023-04-23 DIAGNOSIS — Z85820 Personal history of malignant melanoma of skin: Secondary | ICD-10-CM | POA: Diagnosis not present

## 2023-04-23 DIAGNOSIS — Z85828 Personal history of other malignant neoplasm of skin: Secondary | ICD-10-CM | POA: Diagnosis not present

## 2023-04-23 DIAGNOSIS — L570 Actinic keratosis: Secondary | ICD-10-CM | POA: Diagnosis not present

## 2023-04-23 DIAGNOSIS — L821 Other seborrheic keratosis: Secondary | ICD-10-CM | POA: Diagnosis not present

## 2023-05-06 LAB — HELIX MOLECULAR SCREEN: Genetic Analysis Overall Interpretation: NEGATIVE

## 2023-05-26 ENCOUNTER — Other Ambulatory Visit: Payer: Medicare Other

## 2023-05-26 DIAGNOSIS — K648 Other hemorrhoids: Secondary | ICD-10-CM | POA: Diagnosis not present

## 2023-05-26 DIAGNOSIS — K921 Melena: Secondary | ICD-10-CM | POA: Diagnosis not present

## 2023-05-29 DIAGNOSIS — H04123 Dry eye syndrome of bilateral lacrimal glands: Secondary | ICD-10-CM | POA: Diagnosis not present

## 2023-05-29 DIAGNOSIS — H26492 Other secondary cataract, left eye: Secondary | ICD-10-CM | POA: Diagnosis not present

## 2023-05-29 DIAGNOSIS — Z961 Presence of intraocular lens: Secondary | ICD-10-CM | POA: Diagnosis not present

## 2023-05-29 DIAGNOSIS — H34232 Retinal artery branch occlusion, left eye: Secondary | ICD-10-CM | POA: Diagnosis not present

## 2023-05-29 DIAGNOSIS — H43812 Vitreous degeneration, left eye: Secondary | ICD-10-CM | POA: Diagnosis not present

## 2023-06-02 ENCOUNTER — Other Ambulatory Visit: Payer: Medicare Other

## 2023-06-03 ENCOUNTER — Ambulatory Visit: Payer: Medicare Other | Admitting: Cardiology

## 2023-06-04 ENCOUNTER — Ambulatory Visit (HOSPITAL_COMMUNITY)
Admission: RE | Admit: 2023-06-04 | Discharge: 2023-06-04 | Disposition: A | Discharge status: Home / Self Care | Payer: Medicare Other | Source: Ambulatory Visit | Attending: Cardiovascular Disease | Admitting: Cardiovascular Disease

## 2023-06-04 DIAGNOSIS — I6523 Occlusion and stenosis of bilateral carotid arteries: Secondary | ICD-10-CM | POA: Diagnosis not present

## 2023-06-04 NOTE — Progress Notes (Signed)
Carotid artery duplex 06/04/2023: Right Carotid: Velocities in the right ICA are consistent with a 1-39% stenosis. Left Carotid: Velocities in the left ICA are consistent with a 60-79% stenosis. Vertebrals:  Bilateral vertebral arteries demonstrate antegrade flow. Subclavians: Right subclavian artery flow was disturbed. Normal flow hemodynamics were seen in the left subclavian artery.

## 2023-06-05 NOTE — Progress Notes (Signed)
Cardiology Office Note:  .   Date:  06/06/2023  ID:  Cody Hodge, DOB Jan 10, 1946, MRN [id number] PCP: Asencion Gowda.Cody Saucer, MD (Inactive)  [facility] Providers Cardiologist:  Dr.Ganji  }   History of Present Illness: Cody Hodge is a 77 y.o. male history of PAF, hyperlipidemia, asymptomatic bilateral carotid artery stenosis, CAD status post stenting to very large PL branch of the RCA with residual stenosis in D1-D2 treated medically also found to have occluded small circumflex OM1.  Last seen by Dr. Jacinto Halim on 01/07/2023 and was stable from a cardiac standpoint.  He remained on anticoagulation therapy CHA2DS2-VASc score 4  on Eliquis.   He comes today feeling very well.  He had played tennis earlier today.  He plays several times a week without any complaints of chest pain dyspnea dizziness or palpitations.  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  He has been medically compliant.  Denies any issues with bleeding on Eliquis.  He denies any rapid heart rhythm or palpitations.  ROS: As above otherwise negative.  Studies Reviewed: Marland Kitchen   EKG Interpretation Date/Time:  Friday June 06 2023 13:54:24 EDT Ventricular Rate:  75 PR Interval:  184 QRS Duration:  98 QT Interval:  384 QTC Calculation: 428 R Axis:   65  Text Interpretation: Normal sinus rhythm Normal ECG When compared with ECG of 06-Dec-2017 22:46, PREVIOUS ECG IS PRESENT Confirmed by Joni Reining [phone]) on 06/06/2023 2:20:11 PM    Carotid Study 06/04/2023 Summary:  Right Carotid: Velocities in the right ICA are consistent with a 1-39%  stenosis.   Left Carotid: Velocities in the left ICA are consistent with a 60-79%  stenosis.   Vertebrals: Bilateral vertebral arteries demonstrate antegrade flow.  Subclavians: Right subclavian artery flow was disturbed. Normal flow               hemodynamics were seen in the  left subclavian artery.     Physical Exam:   VS:  BP 116/64 (BP Location: Left Arm, Patient Position: Sitting, Cuff Size: Normal)   Pulse 75   Ht 5\' 10"  (1.778 m)   Wt 183 lb (83 kg)   BMI 26.26 kg/m    Wt Readings from Last 3 Encounters:  06/06/23 183 lb (83 kg)  01/07/23 180 lb 3.2 oz (81.7 kg)  06/03/22 187 lb (84.8 kg)    GEN: Well nourished, well developed in no acute distress NECK: No JVD; No carotid bruits CARDIAC: RRR, no murmurs, rubs, gallops RESPIRATORY:  Clear to auscultation without rales, wheezing or rhonchi  ABDOMEN: Soft, non-tender, non-distended EXTREMITIES:  No edema; No deformity   ASSESSMENT AND PLAN: .    PAF: He has had no recurrence of irregular heart rhythm.  He remains on Eliquis 5 mg twice daily CHA2DS2-VASc score 4, and metoprolol succinate 25 mg twice a day.  May need to consider cardiac monitor versus ILR, to rule out recurrence of atrial fibrillation.  He has been on Eliquis for several years and does not feel any irregular heart rhythm or racing currently.  Defer to Dr. Jacinto Halim for his recommendation concerning this.  The patient will be in [place], [place] until the end of May 2025.  He will follow-up with Dr. Jacinto Halim thereafter.  Refills on Eliquis are provided along with samples.  2.  Bilateral carotid artery disease: Most recent carotid ultrasound as above with 1  to 39% stenosis only.  Continue statin therapy, purposeful exercise.  Follow-up carotid ultrasounds per [facility]  3.  Coronary artery disease: History of stenting to large PL branch of RCA with residual stenosis in the diagonal 1 diagonal 2 which was to be treated medically.  Continue secondary management with blood pressure control, lipid management, low-cholesterol diet, and continue purposeful exercise.  4.  Hypercholesterolemia: Continue atorvastatin 20 mg daily.  Goal of LDL less than 70.  Most recent labs 04/01/2023 total cholesterol 119, LDL 58, HDL 46.  No changes in  medication regimen.   Signed, Bettey Mare. Liborio Nixon, ANP, AACC

## 2023-06-06 ENCOUNTER — Encounter: Payer: Self-pay | Admitting: Adult Health

## 2023-06-06 ENCOUNTER — Ambulatory Visit: Payer: Medicare Other | Attending: Cardiology | Admitting: Adult Health

## 2023-06-06 VITALS — BP 116/64 | HR 75 | Ht 70.0 in | Wt 183.0 lb

## 2023-06-06 DIAGNOSIS — I25118 Atherosclerotic heart disease of native coronary artery with other forms of angina pectoris: Secondary | ICD-10-CM | POA: Diagnosis not present

## 2023-06-06 DIAGNOSIS — I480 Paroxysmal atrial fibrillation: Secondary | ICD-10-CM | POA: Diagnosis not present

## 2023-06-06 DIAGNOSIS — I779 Disorder of arteries and arterioles, unspecified: Secondary | ICD-10-CM | POA: Diagnosis not present

## 2023-06-06 DIAGNOSIS — E7800 Pure hypercholesterolemia, unspecified: Secondary | ICD-10-CM | POA: Diagnosis not present

## 2023-06-06 MED ORDER — APIXABAN 5 MG PO TABS: 5.0000 mg | ORAL_TABLET | Freq: Two times a day (BID) | ORAL | Status: DC

## 2023-06-06 NOTE — Patient Instructions (Signed)
Medication Instructions:  No Changes *If you need a refill on your cardiac medications before your next appointment, please call your pharmacy*   Lab Work: No Labs If you have labs (blood work) drawn today and your tests are completely normal, you will receive your results only by: MyChart Message (if you have MyChart) OR A paper copy in the mail If you have any lab test that is abnormal or we need to change your treatment, we will call you to review the results.   Testing/Procedures: No Testing   Follow-Up: At [facility], you and your health needs are our priority.  As part of our continuing mission to provide you with exceptional heart care, we have created designated Provider Care Teams.  These Care Teams include your primary Cardiologist (physician) and Advanced Practice Providers (APPs -  Physician Assistants and Nurse Practitioners) who all work together to provide you with the care you need, when you need it.  We recommend signing up for the patient portal called "MyChart".  Sign up information is provided on this After Visit Summary.  MyChart is used to connect with patients for Virtual Visits (Telemedicine).  Patients are able to view lab/test results, encounter notes, upcoming appointments, etc.  Non-urgent messages can be sent to your provider as well.   To learn more about what you can do with MyChart, go to [email or web address].    Your next appointment:   June 2025 ( Call March 2025 to schedule Appointment)  Provider:   Yates Decamp, MD

## 2023-06-16 ENCOUNTER — Other Ambulatory Visit: Payer: Self-pay | Admitting: Cardiology

## 2023-06-16 DIAGNOSIS — E7800 Pure hypercholesterolemia, unspecified: Secondary | ICD-10-CM

## 2023-09-15 ENCOUNTER — Other Ambulatory Visit: Payer: Self-pay | Admitting: Cardiology

## 2023-09-15 DIAGNOSIS — E782 Mixed hyperlipidemia: Secondary | ICD-10-CM

## 2023-11-21 ENCOUNTER — Telehealth: Payer: Self-pay | Admitting: Adult Health

## 2023-11-21 ENCOUNTER — Other Ambulatory Visit: Payer: Self-pay

## 2023-11-21 MED ORDER — APIXABAN 5 MG PO TABS: 5.0000 mg | ORAL_TABLET | Freq: Two times a day (BID) | ORAL | 1 refills | Status: DC

## 2023-11-21 NOTE — Telephone Encounter (Signed)
 Prescription refill request for Eliquis received. Indication:afib Last office visit:10/24 Scr:1.00  8/24 Age: 78 Weight:83  kg  Prescription refilled

## 2023-11-21 NOTE — Telephone Encounter (Signed)
*  STAT* If patient is at the pharmacy, call can be transferred to refill team.   1. Which medications need to be refilled? (please list name of each medication and dose if known) apixaban (ELIQUIS) 5 MG TABS tablet   2. Which pharmacy/location (including street and city if local pharmacy) is medication to be sent to? [id number] - [place], [place] - [address] [phone]   3. Do they need a 30 day or 90 day supply? 9 day supply until he receives mail order, pt is out of town

## 2024-01-05 ENCOUNTER — Encounter: Payer: Self-pay | Admitting: Cardiology

## 2024-01-05 ENCOUNTER — Other Ambulatory Visit: Payer: Self-pay | Admitting: Cardiology

## 2024-01-05 DIAGNOSIS — E7800 Pure hypercholesterolemia, unspecified: Secondary | ICD-10-CM

## 2024-01-05 DIAGNOSIS — I480 Paroxysmal atrial fibrillation: Secondary | ICD-10-CM

## 2024-01-05 DIAGNOSIS — I2510 Atherosclerotic heart disease of native coronary artery without angina pectoris: Secondary | ICD-10-CM

## 2024-01-05 DIAGNOSIS — I779 Disorder of arteries and arterioles, unspecified: Secondary | ICD-10-CM

## 2024-01-05 DIAGNOSIS — I10 Essential (primary) hypertension: Secondary | ICD-10-CM

## 2024-01-07 ENCOUNTER — Other Ambulatory Visit: Payer: Self-pay | Admitting: *Deleted

## 2024-01-07 DIAGNOSIS — I2510 Atherosclerotic heart disease of native coronary artery without angina pectoris: Secondary | ICD-10-CM

## 2024-01-07 DIAGNOSIS — E7800 Pure hypercholesterolemia, unspecified: Secondary | ICD-10-CM

## 2024-01-07 DIAGNOSIS — I779 Disorder of arteries and arterioles, unspecified: Secondary | ICD-10-CM

## 2024-01-07 DIAGNOSIS — I10 Essential (primary) hypertension: Secondary | ICD-10-CM

## 2024-01-07 DIAGNOSIS — I480 Paroxysmal atrial fibrillation: Secondary | ICD-10-CM

## 2024-01-07 NOTE — Telephone Encounter (Signed)
CBC,Lipids,CMP

## 2024-01-14 ENCOUNTER — Ambulatory Visit: Payer: Self-pay | Admitting: *Deleted

## 2024-01-14 LAB — LIPID PANEL
Chol/HDL Ratio: 2.6 ratio (ref 0.0–5.0)
Cholesterol, Total: 110 mg/dL (ref 100–199)
HDL: 42 mg/dL (ref >39)
LDL Chol Calc (NIH): 55 mg/dL (ref 0–99)
Triglycerides: 54 mg/dL (ref 0–149)
VLDL Cholesterol Cal: 13 mg/dL (ref 5–40)

## 2024-01-14 LAB — CBC
Hematocrit: 43 % (ref 37.5–51.0)
Hemoglobin: 13.9 g/dL (ref 13.0–17.7)
MCH: 28.7 pg (ref 26.6–33.0)
MCHC: 32.3 g/dL (ref 31.5–35.7)
MCV: 89 fL (ref 79–97)
Platelets: 292 10*3/uL (ref 150–450)
RBC: 4.85 x10E6/uL (ref 4.14–5.80)
RDW: 12.8 % (ref 11.6–15.4)
WBC: 7.6 10*3/uL (ref 3.4–10.8)

## 2024-01-14 LAB — COMPREHENSIVE METABOLIC PANEL WITH GFR
ALT: 82 IU/L — ABNORMAL HIGH (ref 0–44)
AST: 51 IU/L — ABNORMAL HIGH (ref 0–40)
Albumin: 4.4 g/dL (ref 3.8–4.8)
Alkaline Phosphatase: 112 IU/L (ref 44–121)
BUN/Creatinine Ratio: 19 (ref 10–24)
BUN: 22 mg/dL (ref 8–27)
Bilirubin Total: 0.6 mg/dL (ref 0.0–1.2)
CO2: 23 mmol/L (ref 20–29)
Calcium: 9.5 mg/dL (ref 8.6–10.2)
Chloride: 102 mmol/L (ref 96–106)
Creatinine, Ser: 1.17 mg/dL (ref 0.76–1.27)
Globulin, Total: 2.3 g/dL (ref 1.5–4.5)
Glucose: 91 mg/dL (ref 70–99)
Potassium: 4.9 mmol/L (ref 3.5–5.2)
Sodium: 142 mmol/L (ref 134–144)
Total Protein: 6.7 g/dL (ref 6.0–8.5)
eGFR: 64 mL/min/{1.73_m2} (ref >59)

## 2024-01-22 ENCOUNTER — Other Ambulatory Visit: Payer: Self-pay | Admitting: Gastroenterology

## 2024-01-22 DIAGNOSIS — R7401 Elevation of levels of liver transaminase levels: Secondary | ICD-10-CM

## 2024-01-27 ENCOUNTER — Ambulatory Visit
Admission: RE | Admit: 2024-01-27 | Discharge: 2024-01-27 | Disposition: A | Discharge status: Home / Self Care | Source: Ambulatory Visit | Attending: Gastroenterology | Admitting: Gastroenterology

## 2024-01-27 DIAGNOSIS — R7401 Elevation of levels of liver transaminase levels: Secondary | ICD-10-CM

## 2024-02-16 ENCOUNTER — Other Ambulatory Visit: Payer: Self-pay | Admitting: Cardiology

## 2024-03-09 ENCOUNTER — Encounter: Payer: Self-pay | Admitting: Cardiology

## 2024-03-09 ENCOUNTER — Ambulatory Visit: Attending: Internal Medicine | Admitting: Cardiology

## 2024-03-09 ENCOUNTER — Other Ambulatory Visit: Payer: Self-pay | Admitting: *Deleted

## 2024-03-09 VITALS — BP 110/60 | HR 55 | Resp 16 | Ht 70.0 in | Wt 183.8 lb

## 2024-03-09 DIAGNOSIS — I10 Essential (primary) hypertension: Secondary | ICD-10-CM | POA: Insufficient documentation

## 2024-03-09 DIAGNOSIS — E7800 Pure hypercholesterolemia, unspecified: Secondary | ICD-10-CM

## 2024-03-09 DIAGNOSIS — I2510 Atherosclerotic heart disease of native coronary artery without angina pectoris: Secondary | ICD-10-CM

## 2024-03-09 DIAGNOSIS — I480 Paroxysmal atrial fibrillation: Secondary | ICD-10-CM | POA: Insufficient documentation

## 2024-03-09 DIAGNOSIS — I779 Disorder of arteries and arterioles, unspecified: Secondary | ICD-10-CM | POA: Diagnosis present

## 2024-03-09 MED ORDER — ATORVASTATIN CALCIUM 10 MG PO TABS
10.0000 mg | ORAL_TABLET | Freq: Every morning | ORAL | 3 refills | Status: AC
Start: 2024-03-09 — End: ?

## 2024-03-09 NOTE — Progress Notes (Signed)
 Cardiology Office Note:  .   Date:  03/09/2024  ID:  Cody Hodge, DOB [birth year], MRN [id number] PCP: Merilee [email or web address], MD (Inactive)  [facility] Providers Cardiologist:  Gordy Bergamo, MD   History of Present Illness: Cody Hodge is a 78 y.o. male history of PAF, hyperlipidemia, asymptomatic bilateral carotid artery stenosis, CAD status post stenting to very large PL branch of the RCA with residual stenosis in D1-D2 treated medically also found to have occluded small circumflex OM1.  He is presently on Eliquis  chronically for a CHA2DS2-VASc risk score of 4.0.  Discussed the use of AI scribe software for clinical note transcription with the patient, who gave verbal consent to proceed.  History of Present Illness Cody Hodge is a 78 year old male with coronary artery disease and atrial fibrillation who presents for routine follow-up.  He experiences episodes of unexpected shortness of breath and daytime sleepiness, often falling asleep while watching TV. He denies chest pain or palpitations. His feet are slightly swollen.  He is on atorvastatin  20 mg once daily, Zetia  10 mg once daily, metoprolol  25 mg twice daily, ramipril  5 mg once daily, and Eliquis  for atrial fibrillation, maintaining sinus rhythm.  He has coronary artery disease and carotid artery disease with left carotid artery stenosis of 60-79%. His most recent LDL was 55.  Labs   Lab Results  Component Value Date   CHOL 110 01/13/2024   HDL 42 01/13/2024   LDLCALC 55 01/13/2024   TRIG 54 01/13/2024   CHOLHDL 2.6 01/13/2024   Lipoprotein (a)  Date/Time Value Ref Range Status  01/09/2022 02:50 PM 41.1 <75.0 nmol/L Final    Comment:    Note:  Values greater than or equal to 75.0 nmol/L may        indicate an independent risk factor for CHD,        but must be evaluated with caution when applied        to non-Caucasian populations due to the        influence of genetic factors on Lp(a)  across        ethnicities.     Lab Results  Component Value Date   NA 142 01/13/2024   K 4.9 01/13/2024   CO2 23 01/13/2024   GLUCOSE 91 01/13/2024   BUN 22 01/13/2024   CREATININE 1.17 01/13/2024   CALCIUM  9.5 01/13/2024   GFR 69.00 09/26/2014   EGFR 64 01/13/2024   GFRNONAA >60 02/22/2021      Latest Ref Rng & Units 01/13/2024    8:56 AM 04/01/2023    2:50 PM 01/09/2022    2:50 PM  BMP  Glucose 70 - 99 mg/dL 91  82  94   BUN 8 - 27 mg/dL 22  30  24    Creatinine 0.76 - 1.27 mg/dL 8.82  8.99  8.92   BUN/Creat Ratio 10 - 24 19  30  22    Sodium 134 - 144 mmol/L 142  141  139   Potassium 3.5 - 5.2 mmol/L 4.9  5.0  4.7   Chloride 96 - 106 mmol/L 102  103  100   CO2 20 - 29 mmol/L 23  27  25    Calcium  8.6 - 10.2 mg/dL 9.5  9.6  9.3       Latest Ref Rng & Units 01/13/2024    8:53 AM 04/01/2023    2:49 PM 01/09/2022    2:50 PM  CBC  WBC 3.4 - 10.8  x10E3/uL 7.6  8.0  7.6   Hemoglobin 13.0 - 17.7 g/dL 86.0  86.3  86.1   Hematocrit 37.5 - 51.0 % 43.0  41.7  40.7   Platelets 150 - 450 x10E3/uL 292  274  258    Lab Results  Component Value Date   HGBA1C 5.3 12/23/2019    Lab Results  Component Value Date   TSH 1.220 01/09/2022    ROS  Review of Systems  Cardiovascular:  Negative for chest pain, dyspnea on exertion and leg swelling.   Physical Exam:   VS:  BP 110/60 (BP Location: Left Arm, Patient Position: Sitting, Cuff Size: Normal)   Pulse (!) 55   Resp 16   Ht 5' 10 (1.778 m)   Wt 183 lb 12.8 oz (83.4 kg)   BMI 26.37 kg/m    Wt Readings from Last 3 Encounters:  03/09/24 183 lb 12.8 oz (83.4 kg)  06/06/23 183 lb (83 kg)  01/07/23 180 lb 3.2 oz (81.7 kg)    Physical Exam Neck:     Vascular: No JVD.  Cardiovascular:     Rate and Rhythm: Normal rate and regular rhythm.     Pulses: Intact distal pulses.          Carotid pulses are  on the right side with bruit and  on the left side with bruit.    Heart sounds: Normal heart sounds. No murmur heard.    No gallop.   Pulmonary:     Effort: Pulmonary effort is normal.     Breath sounds: Normal breath sounds.  Abdominal:     General: Bowel sounds are normal.     Palpations: Abdomen is soft.  Musculoskeletal:     Right lower leg: No edema.     Left lower leg: No edema.    Studies Reviewed: SABRA    Coronary angiogram 03/18/2017:  Left anterior descending: No critical stenosis. Diagonal 1 & Diagonal 2 80% proximal stenoses, not suitable for coronary intervention due to acute angle.  Left circumflex: Small nondominant vessel with proximal 90% stenosis, followed by subtotal distal OM-1. Proximal and mid severe diffuse disease and extends to LM. High risk lesion for PCI and very small.  Right: Large dominant vessel with posterior descending and posterolateral branches. Large second posterlolateral branch with 90% napking ring stenosis, proximal diffuse 30-40% disease. This vessel also gives collaterals to second OM.  SYNERGY 3.0 X 28 mm drug eluting stent     Carotid Study 06/04/2023 Right Carotid: Velocities in the right ICA are consistent with a 1-39% stenosis.  Left Carotid: Velocities in the left ICA are consistent with a 60-79% stenosis.  Vertebrals: Bilateral vertebral arteries demonstrate antegrade flow.  Subclavians: Right subclavian artery flow was disturbed. Normal flow hemodynamics were seen in the left subclavian artery.  Unchanged from prior study 1 year ago  EKG:       EKG 06/06/2023: Normal sinus rhythm, normal EKG.  Medications ordered    Meds ordered this encounter  Medications   atorvastatin  (LIPITOR) 10 MG tablet    Sig: Take 1 tablet (10 mg total) by mouth every morning.    Dispense:  90 tablet    Refill:  3     ASSESSMENT AND PLAN: .      ICD-10-CM   1. Coronary artery disease involving native coronary artery of native heart without angina pectoris  I25.10 Lipid panel    Hepatic function panel    2. Hypercholesterolemia  E78.00 atorvastatin  (LIPITOR) 10 MG  tablet     Lipid panel    Hepatic function panel    3. PAF (paroxysmal atrial fibrillation) (HCC)  I48.0     4. Essential hypertension  I10     5. Bilateral carotid artery disease, unspecified type (HCC)  I77.9 VAS US  CAROTID     Assessment & Plan Coronary artery disease without angina pectoris Coronary artery disease is well-controlled with no angina. Medications are effective for coronary artery disease, hypertension, and carotid artery disease. Cholesterol levels are well controlled with LDL at 55. - Continue atorvastatin  10 mg daily - Continue Zetia  10 mg daily - Continue metoprolol  25 mg twice daily - Continue ramipril  5 mg daily  Left carotid artery stenosis, 60-79% Left carotid artery stenosis remains at 60-79% as per the carotid artery duplex from September 24. - Order carotid duplex in October - Review carotid duplex results at next visit  Atrial fibrillation in sinus rhythm Atrial fibrillation is well-controlled with maintenance of sinus rhythm. He is on Eliquis  for anticoagulation. - Continue Eliquis   Essential hypertension Blood pressure is well-controlled with current medications, including metoprolol  and ramipril . - Continue metoprolol  25 mg twice daily - Continue ramipril  5 mg daily  Mixed hyperlipidemia with mildly elevated liver enzymes and fatty liver Mixed hyperlipidemia is managed with atorvastatin  and Zetia . Liver enzymes are mildly elevated, likely due to atorvastatin , and ultrasound shows mild fatty liver. Decision made to reduce atorvastatin  dose to mitigate liver enzyme elevation. - Reduce atorvastatin  to 10 mg daily - Continue Zetia  10 mg daily - Order liver function tests and lipid panel before next visit in 3 months.  Signed,  Gordy Bergamo, MD, [facility] 03/09/2024, 6:21 PM [facility] [address] [place], [place] [address] Phone: [phone]. Fax:  [phone]

## 2024-03-09 NOTE — Patient Instructions (Signed)
 Medication Instructions:  Your physician recommends that you continue on your current medications as directed. Please refer to the Current Medication list given to you today.  *If you need a refill on your cardiac medications before your next appointment, please call your pharmacy*  Lab Work: Have fasting lab work (lipid and liver profiles) checked a few days before your next visit with Dr Ladona If you have labs (blood work) drawn today and your tests are completely normal, you will receive your results only by: MyChart Message (if you have MyChart) OR A paper copy in the mail If you have any lab test that is abnormal or we need to change your treatment, we will call you to review the results.  Testing/Procedures: Your physician has requested that you have a carotid duplex in October 2025 (after October 23). This test is an ultrasound of the carotid arteries in your neck. It looks at blood flow through these arteries that supply the brain with blood. Allow one hour for this exam. There are no restrictions or special instructions.   Follow-Up: At [facility], you and your health needs are our priority.  As part of our continuing mission to provide you with exceptional heart care, our providers are all part of one team.  This team includes your primary Cardiologist (physician) and Advanced Practice Providers or APPs (Physician Assistants and Nurse Practitioners) who all work together to provide you with the care you need, when you need it.  Your next appointment:   3 month(s)  Provider:   Gordy Ladona, MD    We recommend signing up for the patient portal called MyChart.  Sign up information is provided on this After Visit Summary.  MyChart is used to connect with patients for Virtual Visits (Telemedicine).  Patients are able to view lab/test results, encounter notes, upcoming appointments, etc.  Non-urgent messages can be sent to your provider as well.   To learn more about what you  can do with MyChart, go to [email or web address].   Other Instructions

## 2024-04-08 ENCOUNTER — Encounter: Payer: Self-pay | Admitting: Family Medicine

## 2024-04-08 ENCOUNTER — Ambulatory Visit: Payer: Self-pay | Admitting: Family Medicine

## 2024-04-08 ENCOUNTER — Ambulatory Visit (INDEPENDENT_AMBULATORY_CARE_PROVIDER_SITE_OTHER): Admitting: Family Medicine

## 2024-04-08 VITALS — BP 110/62 | HR 65 | Temp 97.8°F | Ht 70.0 in | Wt 190.0 lb

## 2024-04-08 DIAGNOSIS — I10 Essential (primary) hypertension: Secondary | ICD-10-CM

## 2024-04-08 DIAGNOSIS — R202 Paresthesia of skin: Secondary | ICD-10-CM | POA: Diagnosis not present

## 2024-04-08 DIAGNOSIS — R7989 Other specified abnormal findings of blood chemistry: Secondary | ICD-10-CM

## 2024-04-08 DIAGNOSIS — Z79899 Other long term (current) drug therapy: Secondary | ICD-10-CM

## 2024-04-08 DIAGNOSIS — E782 Mixed hyperlipidemia: Secondary | ICD-10-CM | POA: Diagnosis not present

## 2024-04-08 DIAGNOSIS — K760 Fatty (change of) liver, not elsewhere classified: Secondary | ICD-10-CM

## 2024-04-08 DIAGNOSIS — R5383 Other fatigue: Secondary | ICD-10-CM | POA: Diagnosis not present

## 2024-04-08 DIAGNOSIS — I2510 Atherosclerotic heart disease of native coronary artery without angina pectoris: Secondary | ICD-10-CM | POA: Diagnosis not present

## 2024-04-08 DIAGNOSIS — Z85820 Personal history of malignant melanoma of skin: Secondary | ICD-10-CM

## 2024-04-08 DIAGNOSIS — R252 Cramp and spasm: Secondary | ICD-10-CM

## 2024-04-08 DIAGNOSIS — R400 Somnolence: Secondary | ICD-10-CM

## 2024-04-08 DIAGNOSIS — Z7901 Long term (current) use of anticoagulants: Secondary | ICD-10-CM

## 2024-04-08 DIAGNOSIS — E559 Vitamin D deficiency, unspecified: Secondary | ICD-10-CM

## 2024-04-08 DIAGNOSIS — I6529 Occlusion and stenosis of unspecified carotid artery: Secondary | ICD-10-CM

## 2024-04-08 DIAGNOSIS — G4733 Obstructive sleep apnea (adult) (pediatric): Secondary | ICD-10-CM

## 2024-04-08 DIAGNOSIS — I480 Paroxysmal atrial fibrillation: Secondary | ICD-10-CM

## 2024-04-08 DIAGNOSIS — Z8639 Personal history of other endocrine, nutritional and metabolic disease: Secondary | ICD-10-CM

## 2024-04-08 DIAGNOSIS — Z7689 Persons encountering health services in other specified circumstances: Secondary | ICD-10-CM

## 2024-04-08 LAB — CBC WITH DIFFERENTIAL/PLATELET
Basophils Absolute: 0.1 K/uL (ref 0.0–0.1)
Basophils Relative: 1 % (ref 0.0–3.0)
Eosinophils Absolute: 0.5 K/uL (ref 0.0–0.7)
Eosinophils Relative: 7.1 % — ABNORMAL HIGH (ref 0.0–5.0)
HCT: 42.4 % (ref 39.0–52.0)
Hemoglobin: 13.9 g/dL (ref 13.0–17.0)
Lymphocytes Relative: 24.3 % (ref 12.0–46.0)
Lymphs Abs: 1.7 K/uL (ref 0.7–4.0)
MCHC: 32.9 g/dL (ref 30.0–36.0)
MCV: 85.5 fl (ref 78.0–100.0)
Monocytes Absolute: 0.6 K/uL (ref 0.1–1.0)
Monocytes Relative: 8.9 % (ref 3.0–12.0)
Neutro Abs: 4.2 K/uL (ref 1.4–7.7)
Neutrophils Relative %: 58.7 % (ref 43.0–77.0)
Platelets: 293 K/uL (ref 150.0–400.0)
RBC: 4.96 Mil/uL (ref 4.22–5.81)
RDW: 14.2 % (ref 11.5–15.5)
WBC: 7.1 K/uL (ref 4.0–10.5)

## 2024-04-08 LAB — FERRITIN: Ferritin: 52.9 ng/mL (ref 22.0–322.0)

## 2024-04-08 LAB — HEPATIC FUNCTION PANEL
ALT: 60 U/L — ABNORMAL HIGH (ref 0–53)
AST: 44 U/L — ABNORMAL HIGH (ref 0–37)
Albumin: 4.5 g/dL (ref 3.5–5.2)
Alkaline Phosphatase: 97 U/L (ref 39–117)
Bilirubin, Direct: 0.1 mg/dL (ref 0.0–0.3)
Total Bilirubin: 0.6 mg/dL (ref 0.2–1.2)
Total Protein: 7.6 g/dL (ref 6.0–8.3)

## 2024-04-08 LAB — BASIC METABOLIC PANEL WITH GFR
BUN: 21 mg/dL (ref 6–23)
CO2: 30 meq/L (ref 19–32)
Calcium: 9.3 mg/dL (ref 8.4–10.5)
Chloride: 99 meq/L (ref 96–112)
Creatinine, Ser: 1.13 mg/dL (ref 0.40–1.50)
GFR: 62.05 mL/min (ref >60.00)
Glucose, Bld: 100 mg/dL — ABNORMAL HIGH (ref 70–99)
Potassium: 4.5 meq/L (ref 3.5–5.1)
Sodium: 137 meq/L (ref 135–145)

## 2024-04-08 LAB — VITAMIN B12: Vitamin B-12: 571 pg/mL (ref 211–911)

## 2024-04-08 LAB — VITAMIN D 25 HYDROXY (VIT D DEFICIENCY, FRACTURES): VITD: 41.43 ng/mL (ref 30.00–100.00)

## 2024-04-08 LAB — FOLATE: Folate: >22.9 ng/mL (ref >5.9)

## 2024-04-08 LAB — TSH: TSH: 1.21 u[IU]/mL (ref 0.35–5.50)

## 2024-04-08 LAB — MAGNESIUM: Magnesium: 2.3 mg/dL (ref 1.5–2.5)

## 2024-04-08 LAB — T4, FREE: Free T4: 0.8 ng/dL (ref 0.60–1.60)

## 2024-04-08 NOTE — Progress Notes (Signed)
 New Patient Office Visit  Subjective    Patient ID: Cody Hodge, male    DOB: 1946-04-05  Age: 78 y.o. MRN: [id number]  CC:  Chief Complaint  Patient presents with   Establish Care    No concerns    Discussed the use of AI scribe software for clinical note transcription with the patient, who gave verbal consent to proceed.  History of Present Illness Cody Hodge is a 78 year old male who presents to establish care.  Paroxysmal atrial fibrillation - Managed with Eliquis  per Dr. Ladona - No recent episodes  - Previously weaned off flecainide    Carotid artery stenosis and coronary artery disease - Left carotid artery stenosis - Coronary artery disease - Managed with atorvastatin  and Zetia  - Closely followed by cardiology   Hepatic dysfunction - Mildly elevated liver enzymes - Ultrasound showed mild fatty liver  Hypertension - Managed with metoprolol  and ramipril    Sleep disturbance and fatigue - Frequent fatigue and trouble staying awake in certain situations - Nocturia two to three times per night - Difficulty returning to sleep after nocturia at times - Past sleep study indicated borderline sleep apnea. Previous sleep study more than 10 years ago. Never used CPAP.   Muscle cramps - Muscle cramps in hands - Takes magnesium citrate and iron supplements  History of malignancy and ocular vascular event - History of melanoma, currently without issues - History of eye stroke. He saw Dr. Octavia   Functional status - Remains active, playing tennis three times a week - Works as a Clinical research associate  - Lives in [place]  part of the year      04/08/2024    9:39 AM  Depression screen PHQ 2/9  Decreased Interest 0  Down, Depressed, Hopeless 0  PHQ - 2 Score 0       Outpatient Encounter Medications as of 04/08/2024  Medication Sig   atorvastatin  (LIPITOR) 10 MG tablet Take 1 tablet (10 mg total) by mouth every morning.   cholecalciferol (VITAMIN D ) 1000 units  tablet Take 1,000 Units by mouth daily.   Coenzyme Q10 (CO Q-10) 200 MG CAPS Take 200 mg by mouth daily.   ELIQUIS  5 MG TABS tablet TAKE 1 TABLET TWICE A DAY   ezetimibe  (ZETIA ) 10 MG tablet TAKE 1 TABLET DAILY   ferrous sulfate 325 (65 FE) MG tablet Take 325 mg by mouth daily.   finasteride  (PROSCAR ) 5 MG tablet Take 5 mg by mouth daily.    ibuprofen (ADVIL,MOTRIN) 200 MG tablet Take 400 mg by mouth 2 (two) times daily as needed for moderate pain.   Magnesium Citrate 100 MG CAPS    metoprolol  succinate (TOPROL -XL) 25 MG 24 hr tablet TAKE 1 TABLET TWICE A DAY. MAY TAKE AN ADDITIONAL TABLET DAILY   Multiple Vitamin (MULTIVITAMIN WITH MINERALS) TABS tablet Take 1 tablet by mouth daily.   OVER THE COUNTER MEDICATION Take 2 capsules by mouth every morning. elysium   Polyethyl Glycol-Propyl Glycol (SYSTANE OP) Place 1 drop into both eyes 2 (two) times daily.    ramipril  (ALTACE ) 5 MG capsule TAKE 1 CAPSULE DAILY   Resveratrol 250 MG CAPS Take 250 mg by mouth 2 (two) times daily.   triamcinolone  cream (KENALOG ) 0.1 % Apply 1 application topically as needed (rash).   TURMERIC PO Take 1 capsule by mouth 2 (two) times daily.    No facility-administered encounter medications on file as of 04/08/2024.    Past Medical History:  Diagnosis Date   Arthritis  BPH (benign prostatic hyperplasia)    Coronary artery disease 09/2014   50% mid to distal LAD, 50-70% ostial first diag, 40% ostial and mid left circ.   ED (erectile dysfunction)    Hemorrhoids    associated with bowel movements   HTN (hypertension)    Hypercholesteremia    Knee arthropathy    Melanoma (HCC)    Melanoma (HCC)    Mixed hyperlipidemia    OSA (obstructive sleep apnea)    mild to moderate refused CPAP   PAF (paroxysmal atrial fibrillation) (HCC)    CHADS VASC score 2 (age>65 and HTN)   Postoperative anemia due to acute blood loss 04/03/2015   Tremor     Past Surgical History:  Procedure Laterality Date   CARDIAC  CATHETERIZATION     2'16- only ever heart cath   CATARACT EXTRACTION, BILATERAL     COLONOSCOPY N/A 02/23/2021   Procedure: COLONOSCOPY;  Surgeon: Burnette Fallow, MD;  Location: [facility] ENDOSCOPY;  Service: Endoscopy;  Laterality: N/A;   CORONARY STENT INTERVENTION N/A 03/18/2017   Procedure: CORONARY STENT INTERVENTION;  Surgeon: Elmira Newman PARAS, MD;  Location: [facility] INVASIVE CV LAB;  Service: Cardiovascular;  Laterality: N/A;   HEMORROIDECTOMY     HYDROCELE EXCISION / REPAIR     LASIK     left and right arthroscopy     left forearm melanoma     LEFT HEART CATH AND CORONARY ANGIOGRAPHY N/A 03/18/2017   Procedure: Left Heart Cath and Coronary Angiography;  Surgeon: Elmira Newman PARAS, MD;  Location: [facility] INVASIVE CV LAB;  Service: Cardiovascular;  Laterality: N/A;   LEFT HEART CATHETERIZATION WITH CORONARY ANGIOGRAM N/A 10/06/2014   Procedure: LEFT HEART CATHETERIZATION WITH CORONARY ANGIOGRAM;  Surgeon: Victory LELON Claudene DOUGLAS, MD;  Location: [facility] CATH LAB;  Service: Cardiovascular;  Laterality: N/A;   TOTAL KNEE ARTHROPLASTY Bilateral 03/29/2015   Procedure: TOTAL KNEE BILATERAL;  Surgeon: Dempsey Moan, MD;  Location: [facility] ORS;  Service: Orthopedics;  Laterality: Bilateral;  spinal anes. placed in OR    Family History  Problem Relation Age of Onset   Dementia Mother    Heart attack Father    Heart disease Father    Alzheimer's disease Sister     Social History   Socioeconomic History   Marital status: Married    Spouse name: Not on file   Number of children: 3   Years of education: Not on file   Highest education level: Professional school degree (e.g., MD, DDS, DVM, JD)  Occupational History   Not on file  Tobacco Use   Smoking status: Never   Smokeless tobacco: Never  Vaping Use   Vaping status: Never Used  Substance and Sexual Activity   Alcohol use: Yes    Comment: 4 drinks per week   Drug use: No   Sexual activity: Not on file  Other Topics Concern   Not on file  Social History  Narrative   Not on file   Social Drivers of Health   Financial Resource Strain: Low Risk  (04/07/2024)   Overall Financial Resource Strain (CARDIA)    Difficulty of Paying Living Expenses: Not hard at all  Food Insecurity: No Food Insecurity (04/07/2024)   Hunger Vital Sign    Worried About Running Out of Food in the Last Year: Never true    Ran Out of Food in the Last Year: Never true  Transportation Needs: No Transportation Needs (04/07/2024)   PRAPARE - Administrator, Civil Service (Medical):  No    Lack of Transportation (Non-Medical): No  Physical Activity: Sufficiently Active (04/07/2024)   Exercise Vital Sign    Days of Exercise per Week: 3 days    Minutes of Exercise per Session: 120 min  Stress: Stress Concern Present (04/07/2024)   [employer] of Occupational Health - Occupational Stress Questionnaire    Feeling of Stress: To some extent  Social Connections: Moderately Isolated (04/07/2024)   Social Connection and Isolation Panel    Frequency of Communication with Friends and Family: Once a week    Frequency of Social Gatherings with Friends and Family: Three times a week    Attends Religious Services: Never    Active Member of Clubs or Organizations: No    Attends Engineer, structural: Not on file    Marital Status: Married  Catering manager Violence: Not on file    Review of Systems  Constitutional:  Positive for malaise/fatigue. Negative for chills and fever.  Respiratory:  Negative for cough and shortness of breath.   Cardiovascular:  Negative for chest pain, palpitations and leg swelling.  Gastrointestinal:  Negative for abdominal pain, constipation, diarrhea, nausea and vomiting.  Genitourinary:  Positive for frequency. Negative for dysuria and urgency.  Musculoskeletal:  Positive for joint pain and myalgias.  Neurological:  Positive for tingling. Negative for dizziness, focal weakness and headaches.  Psychiatric/Behavioral:  Negative for  depression.         Objective    BP 110/62   Pulse 65   Temp 97.8 F (36.6 C) (Temporal)   Ht 5' 10 (1.778 m)   Wt 190 lb (86.2 kg)   SpO2 99%   BMI 27.26 kg/m   Physical Exam Constitutional:      General: He is not in acute distress.    Appearance: He is not ill-appearing.  HENT:     Mouth/Throat:     Mouth: Mucous membranes are moist.     Pharynx: Oropharynx is clear.  Eyes:     Extraocular Movements: Extraocular movements intact.     Conjunctiva/sclera: Conjunctivae normal.     Pupils: Pupils are equal, round, and reactive to light.  Cardiovascular:     Rate and Rhythm: Normal rate and regular rhythm.  Pulmonary:     Effort: Pulmonary effort is normal.     Breath sounds: Normal breath sounds.  Musculoskeletal:     Cervical back: Normal range of motion and neck supple.     Right lower leg: No edema.     Left lower leg: No edema.  Skin:    General: Skin is warm and dry.  Neurological:     General: No focal deficit present.     Mental Status: He is alert and oriented to person, place, and time.     Cranial Nerves: No cranial nerve deficit.     Motor: No weakness.     Coordination: Coordination normal.     Gait: Gait normal.  Psychiatric:        Mood and Affect: Mood normal.        Behavior: Behavior normal.        Thought Content: Thought content normal.         Assessment & Plan:   Problem List Items Addressed This Visit     Coronary artery disease   Fatigue - Primary   Relevant Orders   Home sleep test   Vitamin B12   Magnesium   TSH   Ferritin   Folate   CBC with  Differential/Platelet   Hepatic function panel   Basic metabolic panel with GFR   T4, free   VITAMIN D  25 Hydroxy (Vit-D Deficiency, Fractures)   HTN (hypertension)   Relevant Orders   CBC with Differential/Platelet   Hepatic function panel   Basic metabolic panel with GFR   Mixed hyperlipidemia   OSA (obstructive sleep apnea)   Relevant Orders   Home sleep test   PAF  (paroxysmal atrial fibrillation) (HCC)   Other Visit Diagnoses       Abnormal liver function tests       Relevant Orders   Hepatic function panel     Encounter to establish care         Fatty liver         History of melanoma         Stenosis of carotid artery, unspecified laterality         Daytime sleepiness       Relevant Orders   Home sleep test   Vitamin B12   Magnesium   TSH   Ferritin   Folate     Paresthesias       Relevant Orders   Vitamin B12   Magnesium   TSH   Folate   CBC with Differential/Platelet   T4, free     Muscle cramps       Relevant Orders   Magnesium   TSH   Ferritin   Folate   T4, free     Medication management       Relevant Orders   Vitamin B12   Magnesium   Ferritin   VITAMIN D  25 Hydroxy (Vit-D Deficiency, Fractures)     On continuous oral anticoagulation         Vitamin D  deficiency       Relevant Orders   VITAMIN D  25 Hydroxy (Vit-D Deficiency, Fractures)     Hx of iron deficiency       Relevant Orders   Ferritin       Assessment and Plan Assessment & Plan Establishing Care New patient establishing care. Previous primary care provider was Dr. Addie Glatter.  He spends half the year in [place] .  Atherosclerotic heart disease of native coronary artery Coronary artery disease managed with atorvastatin  10 mg daily and Zetia  10 mg daily. Followed by cardiologist Dr. Ladona.  Essential hypertension Hypertension managed with metoprolol  25 mg BID and ramipril . Blood pressure monitored by cardiologist Dr. Ladona.  Mixed hyperlipidemia Hyperlipidemia managed with atorvastatin  10 mg daily and Zetia  10 mg daily. Mildly elevated liver enzymes led to atorvastatin  dosage reduction.  Fatty liver disease with abnormal liver function tests Mild fatty liver disease with mildly elevated liver enzymes. Atorvastatin  dosage reduced as a precaution. - Order liver function tests and lipid panel.  Left carotid artery stenosis Left carotid artery  stenosis monitored by Dr. Ladona   Paroxysmal atrial fibrillation (history of) Paroxysmal atrial fibrillation managed with Eliquis  for anticoagulation. No recent episodes detected by cardiologist Dr. Ladona.  Obstructive sleep apnea (suspected, pending sleep study) Suspected obstructive sleep apnea due to fatigue and somnolence. Previous sleep study was borderline. Experiences fatigue, falls asleep in certain situations, and wakes unrefreshed. Treatment may improve quality of life if confirmed. - Order home sleep test.  Fatigue and somnolence Chronic fatigue and somnolence possibly due to suspected sleep apnea, chronic health conditions, and physical activity. Reports feeling tired and falling asleep in certain situations. - check labs to look for underlying etiology   Cramping of  hand (trigger finger) Intermittent hand cramping possibly related to trigger finger, occurring during exertion and relieved by stretching.  Paresthesia of skin Occasional numbness and tingling in hands or feet. Possible vitamin deficiency considered. - Order blood tests for vitamin B12, magnesium, and iron levels.  Personal history of malignant melanoma of skin History of melanoma, graded at zero. Followed by dermatologist Dr.  Joshua.  General Health Maintenance Routine health maintenance discussed. Active lifestyle with regular tennis. Monitored by specialists for chronic conditions. - Order blood tests for TSH, free T4, and vitamin D  levels.  Follow-Up Follow-up plans discussed for ongoing monitoring and testing. - Send test results through MyChart portal.     Return for pending labs.   Boby Mackintosh, NP-C

## 2024-04-08 NOTE — Patient Instructions (Signed)
 Please go downstairs for labs before you leave.  I will be in touch with your results and with recommendations.    You should hear from the [facility] regarding the home sleep test

## 2024-04-29 ENCOUNTER — Telehealth: Payer: Self-pay | Admitting: Radiology

## 2024-04-29 NOTE — Telephone Encounter (Signed)
 Oct 25 leaves for 6 months so wanted to check the status. I advised pt l will send message to our referral coordinator as it still says pending

## 2024-04-29 NOTE — Telephone Encounter (Signed)
 Copied from CRM [id number]. Topic: General - Other >> Apr 28, 2024  4:19 PM Thersia BROCKS wrote: Reason for CRM: Patient called in regarding his home sleep study test, would like for NP Boby Mackintosh or nurse to give him a callback regarding this

## 2024-05-06 ENCOUNTER — Other Ambulatory Visit: Payer: Self-pay | Admitting: Cardiology

## 2024-05-06 NOTE — Telephone Encounter (Signed)
 Prescription refill request for Eliquis  received. Indication:afib Last office visit:7/25 Scr:1.13  8/25 Age: 78 Weight:86.2  kg  Prescription refilled

## 2024-05-11 ENCOUNTER — Telehealth: Payer: Self-pay

## 2024-05-11 ENCOUNTER — Encounter: Payer: Self-pay | Admitting: Cardiology

## 2024-05-11 NOTE — Telephone Encounter (Signed)
 Will update all parties involved see notes from preop APP Scot Ford, PAC.

## 2024-05-11 NOTE — Telephone Encounter (Signed)
 Patient has upcoming follow-up with Dr. Ladona.  Cardiac clearance can be addressed at that time.

## 2024-05-11 NOTE — Telephone Encounter (Signed)
   Pre-operative Risk Assessment    Patient Name: Cody Hodge  DOB: 12-24-1945 MRN: [id number]   Date of last office visit: 03/09/24 GORDY BERGAMO, MD Date of next office visit: 06/03/24 GORDY BERGAMO, MD   Request for Surgical Clearance    Procedure:  HEMORRHOIDECTOMY AND ANORECTAL EXAM   Date of Surgery:  Clearance TBD                                Surgeon:  LONNI PIZZA, MD Surgeon's Group or Practice Name:  [facility] Phone number:  [phone] Fax number:  [phone]  ATTN: COLETA MOLT, CMA   Type of Clearance Requested:   - Medical  - Pharmacy:  Hold Apixaban  (Eliquis )     Type of Anesthesia:  General    Additional requests/questions:    Signed, Lucie DELENA Ku   05/11/2024, 10:36 AM

## 2024-05-11 NOTE — Telephone Encounter (Signed)
Clinical pharmacist to review Eliquis 

## 2024-05-16 NOTE — Telephone Encounter (Signed)
 Patient with diagnosis of atrial fibrillation on Eliquis  for anticoagulation.    Procedure:  HEMORRHOIDECTOMY AND ANORECTAL EXAM    Date of Surgery:  Clearance TBD     CHA2DS2-VASc Score = 4   This indicates a 4.8% annual risk of stroke. The patient's score is based upon: CHF History: 0 HTN History: 1 Diabetes History: 0 Stroke History: 0 Vascular Disease History: 1 Age Score: 2 Gender Score: 0  CrCl 65 Platelet count 293  Patient has not had an Afib/aflutter ablation or Watchman within the last 3 months or DCCV within the last 30 days   Per office protocol, patient can hold Eliquis  for 2 days prior to procedure.   Patient will not need bridging with Lovenox  (enoxaparin ) around procedure.  **This guidance is not considered finalized until pre-operative APP has relayed final recommendations.**

## 2024-05-24 ENCOUNTER — Ambulatory Visit (HOSPITAL_BASED_OUTPATIENT_CLINIC_OR_DEPARTMENT_OTHER): Attending: Family Medicine | Admitting: Internal Medicine

## 2024-05-24 DIAGNOSIS — G4733 Obstructive sleep apnea (adult) (pediatric): Secondary | ICD-10-CM | POA: Insufficient documentation

## 2024-05-24 DIAGNOSIS — R5383 Other fatigue: Secondary | ICD-10-CM

## 2024-05-24 DIAGNOSIS — R400 Somnolence: Secondary | ICD-10-CM

## 2024-05-29 NOTE — Procedures (Signed)
%%  startinterp%% Indications for Polysomnography The patient is a 78 year-old Male who is 5' 10 and weighs 190.0 lbs. His BMI equals 27.5.  A home sleep apnea test was performed to evaluate for -.  MedicationNo Data. Polysomnogram Data A home sleep test recorded the standard physiologic parameters including EKG, nasal and oral airflow.  Respiratory parameters of chest and abdominal movements were recorded with Respiratory Inductance Plethysmography belts.  Oxygen saturation was  recorded by pulse oximetry.  Study Architecture The total recording time of the polysomnogram was 364.9 minutes.  The total monitoring time was 365.5 minutes.  Time spent in Supine position was 347.5 minutes.  Respiratory Events The study revealed a presence of 56 obstructive, - central, and - mixed apneas resulting in an Apnea index of 9.2 events per hour.  There were 5 hypopneas (GreaterEqual to3% desaturation and/or arousal) resulting in an Apnea\Hypopnea Index (AHI  GreaterEqual to3% desaturation and/or arousal) of 10.0 events per hour.  There were 1 hypopneas (GreaterEqual to4% desaturation) resulting in an Apnea\Hypopnea Index (AHI GreaterEqual to4% desaturation) of 9.4 events per hour.  There were - Respiratory  Effort Related Arousals resulting in a RERA index of - events per hour. The Respiratory Disturbance Index is 10.0 events per hour.  The snore index was - events per hour.  Mean oxygen saturation was 95.0%.  The lowest oxygen saturation during monitoring time was 89.0%.  Time spent LessEqual to88% oxygen saturation was  minutes ().  Cardiac Summary The average pulse rate was 65.2 bpm.  The minimum pulse rate was 54.0 bpm while the maximum pulse rate was 85.0 bpm.  Cardiac rhythm was normal/abnormal.  Comments:  Diagnosis:  Recommendations:   This study was personally reviewed and electronically signed by: - Accredited Board Certified in Sleep Medicine Date/Time:

## 2024-05-29 NOTE — Procedures (Signed)
 [facility] [address] [place], [place] [address] Tel: [phone]   Fax: [phone]  Home Sleep Test Interpretation  Patient Name: Cody Hodge, Cody Hodge Date: 05/24/2024  Date of Birth: 12-Feb-1946 Study Type: HST  Age: 78 year MRN #: [id number]  Sex: Male Interpreting Physician: NEYSA RAMA, [id number]  Height: 5' 10 Referring Physician: BOBY MACKINTOSH [phone])  Weight: 190.0 lbs Recording Tech: Orie Sires RRT RPSGT RST  BMI: 27.5 Scoring Tech: Will Poet RRT RPSGT RST  ESS: 14 Neck Size: 16.5  %%startinterp%% %%startinterp%% Indications for Polysomnography The patient is a 78 year-old Male who is 5' 10 and weighs 190.0 lbs. His BMI equals 27.5.  A home sleep apnea test was performed to evaluate for -.OSA  Medication  No Data.   Polysomnogram Data A home sleep test recorded the standard physiologic parameters including EKG, nasal and oral airflow.  Respiratory parameters of chest and abdominal movements were recorded with Respiratory Inductance Plethysmography belts.  Oxygen saturation was recorded by pulse oximetry.   Study Architecture The total recording time of the polysomnogram was 364.9 minutes.  The total monitoring time was 365.5 minutes.  Time spent in Supine position was 347.5 minutes.   Respiratory Events The study revealed a presence of 56 obstructive, - central, and - mixed apneas resulting in an Apnea index of 9.2 events per hour.  There were 5 hypopneas (>=3% desaturation and/or arousal) resulting in an Apnea\Hypopnea Index (AHI >=3% desaturation and/or arousal) of 10.0 events per hour.  There were 1 hypopneas (>=4% desaturation) resulting in an Apnea\Hypopnea Index (AHI >=4% desaturation) of 9.4 events per hour.  There were - Respiratory Effort Related Arousals resulting in a RERA index of - events per hour. The Respiratory Disturbance Index is 10.0 events per hour.  The snore index was - events per hour.  Mean  oxygen saturation was 95.0%.  The lowest oxygen saturation during monitoring time was 89.0%.  Time spent <=88% oxygen saturation was - minutes (-).  Cardiac Summary The average pulse rate was 65.2 bpm.  The minimum pulse rate was 54.0 bpm while the maximum pulse rate was 85.0 bpm.    Comments: Mild obstructive sleep apnea, AHI(4%) 9.4/hr. Snoring with oxygen desaturation to a nadir of 89%, mean 95%.  Diagnosis: Obstructive sleep apnea  Recommendations: Suggest autopap 5-15, CPAP titration sleep study or fitted oral appliance.   This study was personally reviewed and electronically signed by: - Accredited Board Certified in Sleep Medicine Date/Time: 05/29/24  1:21    %%endinterp%% %%endinterp%%    Study Overview  Recording Time: 449.1 min. Monitoring Time: 365.5 min.  Analysis Start:  11:22:46 PM Supine Time: 347.5 min.  Analysis Stop:  05:27:40 AM     Study Summary   Count Index Longest Event Duration  Apneas & Hypopneas: 61 10.0  Apneas: 58.6 sec.     Hypopneas: 52.6 sec.  RERAs: - - - sec.  Desaturations: 45 7.4 85.0 sec.  Snores: - - - sec.    Minimum Oxygen Saturation: 89.0%    Respiratory Summary   Total Duration Supine Non-Supine   Count Index Average Longest Count Index Count Index  Obstructive Apnea 56 9.2 19.0 58.6 55 9.5 1 3.3   Mixed Apnea - - - - - - - -   Central Apnea - - - - - - - -   Total Apneas 56 9.2 19.0 58.6 55 9.5 1 3.3  Hypopneas 3% 5 0.8 N.A. N.A. 5 0.9 - -   Apneas & Hyp. 3% 61 10.0 N.A. N.A. 60 10.4 1 3.3            Hypopneas 4% 1 0.2 N.A. N.A. 1 0.2 - -  Apneas & Hyp. 4% 57 9.4 N.A. N.A. 56 9.7 1 3.3             RERAs - - - - - - - -  RDI 61 10.0 N.A. N.A. 60 10.4 1 3.3   Oxygen Saturation Summary   Total Supine Non-Supine  Average SpO2 95.0% 95.0% 95.2%  Minimum SpO2 89.0% 89.0% 93.0%   Maximum SpO2 98.0% 98.0% 98.0%   Oxygen Saturation Distribution  Range (%) Time in range (min) Time in range (%)  90.0 - 100.0  362.5 100.0%  80.0 - 90.0 0.1 0.0%  70.0 - 80.0 - -  60.0 - 70.0 - -  50.0 - 60.0 - -  0.0 - 50.0 - -  Time Spent <=88% SpO2  Range (%) Time in range (min) Time in range (%)  0.0 - 88.0 - -  Cardiac Summary   Total Supine Non-Supine  Average Pulse Rate (BPM) 65.2 64.6 78.2  Minimum Pulse Rate (BPM) 54.0 54.0 70.0  Maximum Pulse Rate (BPM) 85.0 83.0 85.0                      Technologist Comments  -                         Reggy Neysa Bateman, Biomedical engineer of Sleep Medicine  ELECTRONICALLY SIGNED ON:  05/29/2024, 1:18 PM  [facility] PH: [phone]   FX: [phone] ACCREDITED BY THE AMERICAN ACADEMY OF SLEEP MEDICINE

## 2024-05-31 ENCOUNTER — Ambulatory Visit (HOSPITAL_COMMUNITY)
Admission: RE | Admit: 2024-05-31 | Discharge: 2024-05-31 | Disposition: A | Discharge status: Home / Self Care | Source: Ambulatory Visit | Attending: Cardiology | Admitting: Cardiology

## 2024-05-31 DIAGNOSIS — I6522 Occlusion and stenosis of left carotid artery: Secondary | ICD-10-CM | POA: Diagnosis not present

## 2024-05-31 DIAGNOSIS — I779 Disorder of arteries and arterioles, unspecified: Secondary | ICD-10-CM | POA: Diagnosis present

## 2024-06-02 ENCOUNTER — Ambulatory Visit: Payer: Self-pay | Admitting: Cardiology

## 2024-06-02 DIAGNOSIS — I779 Disorder of arteries and arterioles, unspecified: Secondary | ICD-10-CM

## 2024-06-02 NOTE — Progress Notes (Signed)
 Carotid artery duplex 05/31/2024: Right ICA 1 to 39% stenosis. Left ICA 80 to 99% stenosis, peak velocity 349/116 cm/s.  PICC line compared to 06/04/2023, left ICA stenosis has progressed from <80%. Will need follow-up in 6 months.  Consider referral to vascular surgery.  Will discuss on office visit soon.

## 2024-06-03 ENCOUNTER — Ambulatory Visit: Attending: Internal Medicine | Admitting: Cardiology

## 2024-06-03 ENCOUNTER — Encounter: Payer: Self-pay | Admitting: Cardiology

## 2024-06-03 VITALS — BP 114/60 | HR 97 | Resp 16 | Ht 70.0 in | Wt 190.6 lb

## 2024-06-03 DIAGNOSIS — I6522 Occlusion and stenosis of left carotid artery: Secondary | ICD-10-CM | POA: Diagnosis not present

## 2024-06-03 DIAGNOSIS — E7800 Pure hypercholesterolemia, unspecified: Secondary | ICD-10-CM | POA: Insufficient documentation

## 2024-06-03 DIAGNOSIS — I2510 Atherosclerotic heart disease of native coronary artery without angina pectoris: Secondary | ICD-10-CM | POA: Insufficient documentation

## 2024-06-03 DIAGNOSIS — I480 Paroxysmal atrial fibrillation: Secondary | ICD-10-CM | POA: Insufficient documentation

## 2024-06-03 NOTE — Patient Instructions (Signed)
 Medication Instructions:  None ordered.  *If you need a refill on your cardiac medications before your next appointment, please call your pharmacy*  Lab Work: None ordered.  If you have labs (blood work) drawn today and your tests are completely normal, you will receive your results only by: MyChart Message (if you have MyChart) OR A paper copy in the mail If you have any lab test that is abnormal or we need to change your treatment, we will call you to review the results.  Testing/Procedures: CTA of the neck Non-Cardiac CT Angiography (CTA), is a special type of CT scan that uses a computer to produce multi-dimensional views of major blood vessels throughout the body. In CT angiography, a contrast material is injected through an IV to help visualize the blood vessels   Follow-Up: At [facility], you and your health needs are our priority.  As part of our continuing mission to provide you with exceptional heart care, our providers are all part of one team.  This team includes your primary Cardiologist (physician) and Advanced Practice Providers or APPs (Physician Assistants and Nurse Practitioners) who all work together to provide you with the care you need, when you need it.  Your next appointment:   12 months with Dr Ganji

## 2024-06-03 NOTE — Progress Notes (Unsigned)
 Cardiology Office Note:  .   Date:  06/04/2024  ID:  Cody Hodge, DOB Sep 26, 1945, MRN [id number] PCP: Cody Boby CROME, NP-C  [facility] Providers Cardiologist:  Cody Bergamo, MD   History of Present Illness: Cody Hodge is a 78 y.o.  male history of PAF, hyperlipidemia, asymptomatic bilateral carotid artery stenosis, CAD status post stenting to very large PL branch of the RCA with residual stenosis in D1-D2 treated medically also found to have occluded small circumflex OM1.  He is presently on Eliquis  chronically for a CHA2DS2-VASc risk score of 4.0.   In May 2022, carotid duplex arterial greater than 80 to 99% stenosis in the left ICA, underwent carotid CT angiogram revealing a 60 to 75% stenosis only and hence recommended medical therapy.  Repeat follow-up carotid duplex that revealed moderate disease only.  He underwent repeat carotid duplex on 05/31/2024 again revealing 80 to 99% stenosis with peak velocity of greater than 49/116 cm/s.  He remains active and asymptomatic.  Cardiac Studies relevent.    Carotid artery duplex 05/31/2024: Right ICA 1 to 39% stenosis. Left ICA 80 to 99% stenosis, peak velocity 349/116 cm/s.  Compared to 06/04/2023, left ICA stenosis has progressed from <80%. Will need follow-up in 6 months.  Consider referral to vascular surgery.  Coronary angiogram 03/18/2017:  SYNERGY 3.0 X 28 mm drug eluting stent        Discussed the use of AI scribe software for clinical note transcription with the patient, who gave verbal consent to proceed.  History of Present Illness Cody Hodge is a 78 year old male with carotid artery stenosis who presents for follow-up of his carotid artery condition. He was evaluated by Dr. Malvina Hodge for his carotid artery condition.  He has carotid artery stenosis with a carotid duplex in 2022 showing 80-99% stenosis, while a CT scan indicated approximately 75% stenosis. A recent carotid duplex showed  high diastolic velocity, raising concerns about progression, especially as he plans to travel to [place]  for six months.  He is on Eliquis  for atrial fibrillation, atorvastatin  10 mg once daily, Zetia  10 mg once daily, and metoprolol  25 mg twice daily. His cholesterol is well controlled with an LDL of 55, though liver function tests have been slightly abnormal for a long period.  Labs   Lab Results  Component Value Date   CHOL 110 01/13/2024   HDL 42 01/13/2024   LDLCALC 55 01/13/2024   TRIG 54 01/13/2024   CHOLHDL 2.6 01/13/2024   Lipoprotein (a)  Date/Time Value Ref Range Status  01/09/2022 02:50 PM 41.1 <75.0 nmol/L Final    Comment:    Note:  Values greater than or equal to 75.0 nmol/L may        indicate an independent risk factor for CHD,        but must be evaluated with caution when applied        to non-Caucasian populations due to the        influence of genetic factors on Lp(a) across        ethnicities.     Recent Labs    01/13/24 0856 04/08/24 1025  NA 142 137  K 4.9 4.5  CL 102 99  CO2 23 30  GLUCOSE 91 100*  BUN 22 21  CREATININE 1.17 1.13  CALCIUM  9.5 9.3    Lab Results  Component Value Date   ALT 60 (H) 04/08/2024   AST 44 (H) 04/08/2024   ALKPHOS 97  04/08/2024   BILITOT 0.6 04/08/2024      Latest Ref Rng & Units 04/08/2024   10:25 AM 01/13/2024    8:53 AM 04/01/2023    2:49 PM  CBC  WBC 4.0 - 10.5 K/uL 7.1  7.6  8.0   Hemoglobin 13.0 - 17.0 g/dL 86.0  86.0  86.3   Hematocrit 39.0 - 52.0 % 42.4  43.0  41.7   Platelets 150.0 - 400.0 K/uL 293.0  292  274    Lab Results  Component Value Date   HGBA1C 5.3 12/23/2019    Lab Results  Component Value Date   TSH 1.21 04/08/2024    ROS  Review of Systems  Cardiovascular:  Negative for chest pain, dyspnea on exertion and leg swelling.   Physical Exam:   VS:  BP 114/60 (BP Location: Right Arm, Patient Position: Sitting, Cuff Size: Normal)   Pulse 97   Resp 16   Ht 5' 10 (1.778 m)   Wt 190  lb 9.6 oz (86.5 kg)   SpO2 (!) 61%   BMI 27.35 kg/m    Wt Readings from Last 3 Encounters:  06/03/24 190 lb 9.6 oz (86.5 kg)  05/24/24 190 lb (86.2 kg)  04/08/24 190 lb (86.2 kg)    BP Readings from Last 3 Encounters:  06/03/24 114/60  04/08/24 110/62  03/09/24 110/60   Physical Exam Neck:     Vascular: No JVD.  Cardiovascular:     Rate and Rhythm: Normal rate and regular rhythm.     Pulses: Intact distal pulses.          Carotid pulses are  on the right side with bruit and  on the left side with bruit.    Heart sounds: Normal heart sounds. No murmur heard.    No gallop.  Pulmonary:     Effort: Pulmonary effort is normal.     Breath sounds: Normal breath sounds.  Abdominal:     General: Bowel sounds are normal.     Palpations: Abdomen is soft.  Musculoskeletal:     Right lower leg: No edema.     Left lower leg: No edema.    EKG:    EKG Interpretation Date/Time:  Thursday June 03 2024 09:22:35 EDT Ventricular Rate:  62 PR Interval:  184 QRS Duration:  100 QT Interval:  400 QTC Calculation: 406 R Axis:   80  Text Interpretation: Normal sinus rhythm Normal ECG When compared with ECG of 06-Jun-2023 13:54, No significant change was found Confirmed by Ladona Milan [phone]) on 06/03/2024 9:26:37 AM    ASSESSMENT AND PLAN: .      ICD-10-CM   1. Asymptomatic stenosis of left carotid artery  I65.22 EKG 12-Lead    CT ANGIO NECK W OR WO CONTRAST    2. Coronary artery disease involving native coronary artery of native heart without angina pectoris  I25.10     3. PAF (paroxysmal atrial fibrillation) (HCC)  I48.0     4. Hypercholesterolemia  E78.00      Assessment & Plan Severe left carotid artery stenosis The left carotid artery exhibits severe stenosis with diastolic velocity indicating 80-99% stenosis. Previous carotid duplex showed moderate disease, and a CT scan in 2022 indicated approximately 75% stenosis. Current findings necessitate further evaluation to  confirm the degree of stenosis. - Order CT of the head and neck to confirm the degree of stenosis. - If CT confirms high-grade stenosis, consider referral for carotid endarterectomy.  Atrial fibrillation, on anticoagulation and rate  control Atrial fibrillation is managed with anticoagulation and rate control. He is on Eliquis  and metoprolol , maintaining a regular rhythm.  Atherosclerotic heart disease of native coronary artery No symptoms of angina, Remains on statin and Eliquis  for PAF, no ASA indicated.   Hypercholesterolemia, well controlled on therapy Hypercholesterolemia is well controlled with atorvastatin  and Zetia . LDL is at 55 mg/dL, indicating effective management.   Follow up: 1 year.   Signed,  Cody Bergamo, MD, [facility] 06/04/2024, 6:18 AM [facility] [address] [place], [place] [address] Phone: [phone]. Fax:  [phone]

## 2024-06-04 ENCOUNTER — Ambulatory Visit (HOSPITAL_COMMUNITY)
Admission: RE | Admit: 2024-06-04 | Discharge: 2024-06-04 | Disposition: A | Discharge status: Home / Self Care | Source: Ambulatory Visit | Attending: Cardiology | Admitting: Cardiology

## 2024-06-04 DIAGNOSIS — I6522 Occlusion and stenosis of left carotid artery: Secondary | ICD-10-CM | POA: Diagnosis present

## 2024-06-04 MED ORDER — IOHEXOL 350 MG/ML SOLN
75.0000 mL | Freq: Once | INTRAVENOUS | Status: AC | PRN
  Administered 2024-06-04: 75 mL via INTRAVENOUS

## 2024-06-04 NOTE — Progress Notes (Signed)
 Called pt in regards to sleep study results. Pt states he is not convinced and even had his cardiologist look at it and reports at the time there was no diagnosis or recommendations noted. There were even blank spots so I read off to pt:  Diagnosis: Obstructive sleep apnea   Recommendations: Suggest autopap 5-15, CPAP titration sleep study or fitted oral appliance.  And he states that it was not showing on his end the results. I asked pt when his visit was and he said yesterday. I informed pt these results were posted well before yesterday. Pt still wanted to make known he doesn't believe the results and states he had to be convinced to even do the study didn't want to do it anyways so I told pt he does not have to move forward w anything, we are just relaying results and recommendations. Pt states he doesn't understand any of it anyways and would like to at least know the results better if he is to do anything. I offered pt an appt to be seen to discuss this in detail if he would like. Pt is leaving for [place]  at 5 am in the morning and states he needs it to be virtual. Advised pt medicare no longer covers virtual visits and even if they did he will be out of state and [facility] is not licensed out of [place] so will be unable. Pt was upset he couldn't even do a telephone call since he will be gone for the next 6 months. Unfortunately there is nothing we can do at this time, so pt states he will have to put it on hold until he gets back.

## 2024-06-09 ENCOUNTER — Ambulatory Visit: Payer: Self-pay | Admitting: Cardiology

## 2024-06-09 NOTE — Progress Notes (Signed)
 Stable carotid disease, will recheck Carotid duplex in 6 months.  CTA Neck 06/09/24: 1. Approximately 70% stenosis of the proximal left cervical ICA due to mixed atherosclerotic plaque including a prominent noncalcified component, similar to prior study 03/09/2021. 2. Mild stenosis of the left vertebral artery involving the distal V1 and proximal V2 segments. 3. Degenerative changes throughout the visualized spine with significant disc space narrowing from C4-C5 through T1-T2.

## 2024-06-10 ENCOUNTER — Other Ambulatory Visit: Payer: Self-pay | Admitting: Adult Health

## 2024-06-10 DIAGNOSIS — E7800 Pure hypercholesterolemia, unspecified: Secondary | ICD-10-CM

## 2024-06-17 ENCOUNTER — Ambulatory Visit

## 2024-07-02 NOTE — Telephone Encounter (Signed)
 Patient has reviewed results via MyChart.   Last read by Cody Hodge at 5:05AM on 06/13/2024.

## 2024-08-09 ENCOUNTER — Other Ambulatory Visit: Payer: Self-pay | Admitting: Adult Health

## 2024-08-10 ENCOUNTER — Encounter: Payer: Self-pay | Admitting: Family Medicine

## 2024-08-10 ENCOUNTER — Ambulatory Visit (INDEPENDENT_AMBULATORY_CARE_PROVIDER_SITE_OTHER)

## 2024-08-10 ENCOUNTER — Ambulatory Visit: Admitting: Family Medicine

## 2024-08-10 VITALS — BP 128/78 | HR 66 | Ht 70.0 in | Wt 191.2 lb

## 2024-08-10 VITALS — BP 128/78 | HR 66 | Temp 97.9°F | Ht 70.0 in | Wt 191.0 lb

## 2024-08-10 DIAGNOSIS — Z0000 Encounter for general adult medical examination without abnormal findings: Secondary | ICD-10-CM | POA: Diagnosis not present

## 2024-08-10 DIAGNOSIS — G4733 Obstructive sleep apnea (adult) (pediatric): Secondary | ICD-10-CM | POA: Diagnosis not present

## 2024-08-10 DIAGNOSIS — K760 Fatty (change of) liver, not elsewhere classified: Secondary | ICD-10-CM | POA: Diagnosis not present

## 2024-08-10 DIAGNOSIS — R5383 Other fatigue: Secondary | ICD-10-CM

## 2024-08-10 NOTE — Patient Instructions (Signed)
 I will place the order for a CPAP machine. [facility] will call you to discuss this.

## 2024-08-10 NOTE — Patient Instructions (Addendum)
 Mr. Forbush,  Thank you for taking the time for your Medicare Wellness Visit. I appreciate your continued commitment to your health goals. Please review the care plan we discussed, and feel free to reach out if I can assist you further.  Please note that Annual Wellness Visits do not include a physical exam. Some assessments may be limited, especially if the visit was conducted virtually. If needed, we may recommend an in-person follow-up with your provider.  Ongoing Care Seeing your primary care provider every 3 to 6 months helps us  monitor your health and provide consistent, personalized care.   Referrals If a referral was made during today's visit and you haven't received any updates within two weeks, please contact the referred provider directly to check on the status.  Recommended Screenings:  Health Maintenance  Topic Date Due   Hepatitis C Screening  Never done   DTaP/Tdap/Td vaccine (1 - Tdap) Never done   Pneumococcal Vaccine for age over 71 (1 of 2 - PCV) Never done   Zoster (Shingles) Vaccine (1 of 2) Never done   COVID-19 Vaccine (3 - Moderna risk series) 11/19/2019   Flu Shot  03/12/2024   Medicare Annual Wellness Visit  08/10/2025   Meningitis B Vaccine  Aged Out   Colon Cancer Screening  Discontinued       08/10/2024    9:19 AM  Advanced Directives  Does Patient Have a Medical Advance Directive? Yes  Type of Estate Agent of [place];Living will  Does patient want to make changes to medical advance directive? Yes (Inpatient - patient requests chaplain consult to change a medical advance directive)  Copy of Healthcare Power of Attorney in Chart? No - copy requested    Vision: Annual vision screenings are recommended for early detection of glaucoma, cataracts, and diabetic retinopathy. These exams can also reveal signs of chronic conditions such as diabetes and high blood pressure.  Dental: Annual dental screenings help detect early signs of  oral cancer, gum disease, and other conditions linked to overall health, including heart disease and diabetes.  P

## 2024-08-10 NOTE — Progress Notes (Addendum)
 "  Chief Complaint  Patient presents with   Medicare Wellness     Subjective:   Cody Hodge is a 78 y.o. male who presents for a Medicare Annual Wellness Visit.  Visit info / Clinical Intake: Medicare Wellness Visit Type:: Subsequent Annual Wellness Visit Persons participating in visit and providing information:: patient Medicare Wellness Visit Mode:: In-person (required for WTM) Interpreter Needed?: No Pre-visit prep was completed: yes AWV questionnaire completed by patient prior to visit?: yes Date:: 08/06/24 Living arrangements:: lives with spouse/significant other Patient's Overall Health Status Rating: very good Typical amount of pain: some Does pain affect daily life?: no Are you currently prescribed opioids?: no  Dietary Habits and Nutritional Risks How many meals a day?: 3 Eats fruit and vegetables daily?: yes Most meals are obtained by: preparing own meals; eating out In the last 2 weeks, have you had any of the following?: none Diabetic:: no  Functional Status Activities of Daily Living (to include ambulation/medication): Independent Ambulation: Independent with device- listed below Home Assistive Devices/Equipment: Eyeglasses Medication Administration: Independent Home Management (perform basic housework or laundry): Independent Manage your own finances?: yes Primary transportation is: driving Concerns about vision?: no *vision screening is required for WTM* Concerns about hearing?: no  Fall Screening Falls in the past year?: 0 Number of falls in past year: 0 Was there an injury with Fall?: 0 Fall Risk Category Calculator: 0 Patient Fall Risk Level: Low Fall Risk  Fall Risk Patient at Risk for Falls Due to: No Fall Risks Fall risk Follow up: Falls evaluation completed; Falls prevention discussed  Home and Transportation Safety: All rugs have non-skid backing?: yes All stairs or steps have railings?: N/A, no stairs Grab bars in the bathtub or  shower?: (!) no Have non-skid surface in bathtub or shower?: (!) no Good home lighting?: yes Regular seat belt use?: yes Hospital stays in the last year:: no  Cognitive Assessment Difficulty concentrating, remembering, or making decisions? : no Will 6CIT or Mini Cog be Completed: yes What year is it?: 0 points What month is it?: 0 points Give patient an address phrase to remember (5 components): [address] [place] what time is it?: 0 points Count backwards from 20 to 1: 0 points Say the months of the year in reverse: 0 points Repeat the address phrase from earlier: 0 points 6 CIT Score: 0 points  Advance Directives (For Healthcare) Does Patient Have a Medical Advance Directive?: Yes Does patient want to make changes to medical advance directive?: Yes (Inpatient - patient requests chaplain consult to change a medical advance directive) Type of Advance Directive: Healthcare Power of [place]; Living will Copy of Healthcare Power of Attorney in Chart?: No - copy requested Copy of Living Will in Chart?: No - copy requested  Reviewed/Updated  Reviewed/Updated: Reviewed All (Medical, Surgical, Family, Medications, Allergies, Care Teams, Patient Goals)    Allergies (verified) Patient has no known allergies.   Current Medications (verified) Outpatient Encounter Medications as of 08/10/2024  Medication Sig   atorvastatin  (LIPITOR) 10 MG tablet Take 1 tablet (10 mg total) by mouth every morning.   cholecalciferol (VITAMIN D ) 1000 units tablet Take 1,000 Units by mouth daily.   Coenzyme Q10 (CO Q-10) 200 MG CAPS Take 200 mg by mouth daily.   ELIQUIS  5 MG TABS tablet TAKE 1 TABLET TWICE A DAY   ezetimibe  (ZETIA ) 10 MG tablet TAKE 1 TABLET DAILY   ferrous sulfate 325 (65 FE) MG tablet Take 325 mg by mouth daily.  finasteride  (PROSCAR ) 5 MG tablet Take 5 mg by mouth daily.    ibuprofen (ADVIL,MOTRIN) 200 MG tablet Take 400 mg by mouth 2 (two) times daily as needed for  moderate pain.   Magnesium Citrate 100 MG CAPS    metoprolol  succinate (TOPROL -XL) 25 MG 24 hr tablet TAKE 1 TABLET TWICE A DAY. MAY TAKE AN ADDITIONAL TABLET DAILY   Multiple Vitamin (MULTIVITAMIN WITH MINERALS) TABS tablet Take 1 tablet by mouth daily.   OVER THE COUNTER MEDICATION Take 2 capsules by mouth every morning. elysium   Polyethyl Glycol-Propyl Glycol (SYSTANE OP) Place 1 drop into both eyes 2 (two) times daily.    ramipril  (ALTACE ) 5 MG capsule TAKE 1 CAPSULE DAILY   Resveratrol 250 MG CAPS Take 250 mg by mouth 2 (two) times daily.   triamcinolone  cream (KENALOG ) 0.1 % Apply 1 application topically as needed (rash).   No facility-administered encounter medications on file as of 08/10/2024.    History: Past Medical History:  Diagnosis Date   Arthritis    BPH (benign prostatic hyperplasia)    Coronary artery disease 09/2014   50% mid to distal LAD, 50-70% ostial first diag, 40% ostial and mid left circ.   ED (erectile dysfunction)    Hemorrhoids    associated with bowel movements   HTN (hypertension)    Hypercholesteremia    Knee arthropathy    Melanoma (HCC)    Melanoma (HCC)    Mixed hyperlipidemia    OSA (obstructive sleep apnea)    mild to moderate refused CPAP   PAF (paroxysmal atrial fibrillation) (HCC)    CHADS VASC score 2 (age>65 and HTN)   Postoperative anemia due to acute blood loss 04/03/2015   Tremor    Past Surgical History:  Procedure Laterality Date   CARDIAC CATHETERIZATION     2'16- only ever heart cath   CATARACT EXTRACTION, BILATERAL     COLONOSCOPY N/A 02/23/2021   Procedure: COLONOSCOPY;  Surgeon: Burnette Fallow, MD;  Location: [facility] ENDOSCOPY;  Service: Endoscopy;  Laterality: N/A;   CORONARY STENT INTERVENTION N/A 03/18/2017   Procedure: CORONARY STENT INTERVENTION;  Surgeon: Elmira Newman PARAS, MD;  Location: [facility] INVASIVE CV LAB;  Service: Cardiovascular;  Laterality: N/A;   HEMORROIDECTOMY     HYDROCELE EXCISION / REPAIR     LASIK      left and right arthroscopy     left forearm melanoma     LEFT HEART CATH AND CORONARY ANGIOGRAPHY N/A 03/18/2017   Procedure: Left Heart Cath and Coronary Angiography;  Surgeon: Elmira Newman PARAS, MD;  Location: [facility] INVASIVE CV LAB;  Service: Cardiovascular;  Laterality: N/A;   LEFT HEART CATHETERIZATION WITH CORONARY ANGIOGRAM N/A 10/06/2014   Procedure: LEFT HEART CATHETERIZATION WITH CORONARY ANGIOGRAM;  Surgeon: Victory LELON Claudene DOUGLAS, MD;  Location[facility] CATH LAB;  Service: Cardiovascular;  Laterality: N/A;   TOTAL KNEE ARTHROPLASTY Bilateral 03/29/2015   Procedure: TOTAL KNEE BILATERAL;  Surgeon: Dempsey Moan, MD;  Location: [facility] ORS;  Service: Orthopedics;  Laterality: Bilateral;  spinal anes. placed in OR   Family History  Problem Relation Age of Onset   Dementia Mother    Heart attack Father    Heart disease Father    Alzheimer's disease Sister    Social History   Occupational History   Not on file  Tobacco Use   Smoking status: Never   Smokeless tobacco: Never  Vaping Use   Vaping status: Never Used  Substance and Sexual Activity   Alcohol use: Yes  Alcohol/week: 1.0 standard drink of alcohol    Types: 1 Cans of beer per week    Comment: 4 drinks per week   Drug use: No   Sexual activity: Yes   Tobacco Counseling Counseling given: No  SDOH Screenings   Food Insecurity: No Food Insecurity (08/10/2024)  Housing: Low Risk (08/10/2024)  Transportation Needs: No Transportation Needs (08/10/2024)  Utilities: Not At Risk (08/10/2024)  Alcohol Screen: Low Risk (08/06/2024)  Depression (PHQ2-9): Low Risk (08/10/2024)  Financial Resource Strain: Low Risk (08/06/2024)  Physical Activity: Sufficiently Active (08/10/2024)  Social Connections: Moderately Isolated (08/10/2024)  Stress: Stress Concern Present (08/10/2024)  Tobacco Use: Low Risk (08/10/2024)  Health Literacy: Adequate Health Literacy (08/10/2024)   See flowsheets for full screening details  Depression  Screen PHQ 2 & 9 Depression Scale- Over the past 2 weeks, how often have you been bothered by any of the following problems? Little interest or pleasure in doing things: 0 Feeling down, depressed, or hopeless (PHQ Adolescent also includes...irritable): 0 PHQ-2 Total Score: 0 Trouble falling or staying asleep, or sleeping too much: 1 (usually gets 6-7hrs) Feeling tired or having little energy: 0 Poor appetite or overeating (PHQ Adolescent also includes...weight loss): 0 Feeling bad about yourself - or that you are a failure or have let yourself or your family down: 0 Trouble concentrating on things, such as reading the newspaper or watching television (PHQ Adolescent also includes...like school work): 0 Moving or speaking so slowly that other people could have noticed. Or the opposite - being so fidgety or restless that you have been moving around a lot more than usual: 0 Thoughts that you would be better off dead, or of hurting yourself in some way: 0 PHQ-9 Total Score: 1 If you checked off any problems, how difficult have these problems made it for you to do your work, take care of things at home, or get along with other people?: Not difficult at all  Depression Treatment Depression Interventions/Treatment : [id number] Score <4 Follow-up Not Indicated     Goals Addressed               This Visit's Progress     Patient Stated (pt-stated)        Patient stated he plans to continue take meds daily              Objective:    Today's Vitals   08/10/24 0914  BP: 128/78  Pulse: 66  Weight: 191 lb 3.2 oz (86.7 kg)  Height: 5' 10 (1.778 m)   Body mass index is 27.43 kg/m.  Hearing/Vision screen Hearing Screening - Comments:: Denies hearing difficulties   Vision Screening - Comments:: Wears eyeglasses for reading- up to date with routine eye exams with an Optometrist  Immunizations and Health Maintenance Health Maintenance  Topic Date Due   Hepatitis C Screening  Never done    DTaP/Tdap/Td (1 - Tdap) Never done   Pneumococcal Vaccine: 50+ Years (1 of 2 - PCV) Never done   Zoster Vaccines- Shingrix (1 of 2) Never done   COVID-19 Vaccine (3 - Moderna risk series) 11/19/2019   Influenza Vaccine  03/12/2024   Medicare Annual Wellness (AWV)  08/10/2025   Meningococcal B Vaccine  Aged Out   Colonoscopy  Discontinued        Assessment/Plan:  This is a routine wellness examination for Cody Hodge.  Immunizations status: pt will obtain list from previous Provider  Patient Care Team: Lendia Boby CROME, NP-C as PCP - General (Family  Medicine) Ladona Heinz, MD as PCP - Cardiology (Cardiology)  I have personally reviewed and noted the following in the patients chart:   Medical and social history Use of alcohol, tobacco or illicit drugs  Current medications and supplements including opioid prescriptions. Functional ability and status Nutritional status Physical activity Advanced directives List of other physicians Hospitalizations, surgeries, and ER visits in previous 12 months Vitals Screenings to include cognitive, depression, and falls Referrals and appointments  No orders of the defined types were placed in this encounter.  In addition, I have reviewed and discussed with patient certain preventive protocols, quality metrics, and best practice recommendations. A written personalized care plan for preventive services as well as general preventive health recommendations were provided to patient.   Cody Hodge, CMA   08/10/2024   Return in 1 year (on 08/10/2025).  After Visit Summary: (In Person-Declined) Patient declined AVS at this time.  Nurse Notes: pt will schedule an AWV appt for 2026 when in town "

## 2024-08-10 NOTE — Progress Notes (Signed)
 "  Subjective:     Patient ID: Cody Hodge, male    DOB: 07/06/1946, 78 y.o.   MRN: [id number]  Chief Complaint  Patient presents with   Sleep Apnea    05/24/2024 had sleep study, was holding off but now wants to discuss getting CPAP machine    HPI  Discussed the use of AI scribe software for clinical note transcription with the patient, who gave verbal consent to proceed.  History of Present Illness Cody Hodge is a 78 year old male who presents to discuss sleep study results and CPAP therapy.  Sleep-disordered breathing - Diagnosed with sleep apnea approximately 20-25 years ago but did not initiate CPAP therapy - Recent home sleep study demonstrated mild sleep apnea with 9.4 apnea episodes per hour - No nocturnal awakenings with coughing, choking, or gasping - Wife occasionally observes snoring, but not perceived as loud - No witnessed apnea events  Nocturia and sleep maintenance insomnia - Nocturia with 1-3 awakenings nightly to urinate - Occasional difficulty returning to sleep after nocturnal awakenings - Concerned that CPAP therapy may be cumbersome due to frequent nighttime awakenings  Daytime fatigue and sleep quality - Feels rested upon waking - Develops fatigue by mid to late day - Struggles to stay awake in some situations during the day  Physical activity tolerance - Remains very active, playing tennis about five days per week for up to three hours - No limiting fatigue during physical activity  Cardiometabolic history - Mildly elevated liver enzymes with fatty liver on prior ultrasound - Takes medication for blood pressure and cholesterol - Followed by cardiology for atrial fibrillation, now off flecainide  - Rare alcohol consumption      Health Maintenance Due  Topic Date Due   Hepatitis C Screening  Never done    Past Medical History:  Diagnosis Date   Arthritis    BPH (benign prostatic hyperplasia)    Coronary artery disease  09/2014   50% mid to distal LAD, 50-70% ostial first diag, 40% ostial and mid left circ.   ED (erectile dysfunction)    Hemorrhoids    associated with bowel movements   HTN (hypertension)    Hypercholesteremia    Knee arthropathy    Melanoma (HCC)    Melanoma (HCC)    Mixed hyperlipidemia    OSA (obstructive sleep apnea)    mild to moderate refused CPAP   PAF (paroxysmal atrial fibrillation) (HCC)    CHADS VASC score 2 (age>65 and HTN)   Postoperative anemia due to acute blood loss 04/03/2015   Tremor     Past Surgical History:  Procedure Laterality Date   CARDIAC CATHETERIZATION     2'16- only ever heart cath   CATARACT EXTRACTION, BILATERAL     COLONOSCOPY N/A 02/23/2021   Procedure: COLONOSCOPY;  Surgeon: Burnette Fallow, MD;  Location: [facility] ENDOSCOPY;  Service: Endoscopy;  Laterality: N/A;   CORONARY STENT INTERVENTION N/A 03/18/2017   Procedure: CORONARY STENT INTERVENTION;  Surgeon: Elmira Newman PARAS, MD;  Location: [facility] INVASIVE CV LAB;  Service: Cardiovascular;  Laterality: N/A;   HEMORROIDECTOMY     HYDROCELE EXCISION / REPAIR     LASIK     left and right arthroscopy     left forearm melanoma     LEFT HEART CATH AND CORONARY ANGIOGRAPHY N/A 03/18/2017   Procedure: Left Heart Cath and Coronary Angiography;  Surgeon: Elmira Newman PARAS, MD;  Location: [facility] INVASIVE CV LAB;  Service: Cardiovascular;  Laterality: N/A;   LEFT HEART  CATHETERIZATION WITH CORONARY ANGIOGRAM N/A 10/06/2014   Procedure: LEFT HEART CATHETERIZATION WITH CORONARY ANGIOGRAM;  Surgeon: Victory LELON Claudene DOUGLAS, MD;  Location: [facility] CATH LAB;  Service: Cardiovascular;  Laterality: N/A;   TOTAL KNEE ARTHROPLASTY Bilateral 03/29/2015   Procedure: TOTAL KNEE BILATERAL;  Surgeon: Dempsey Moan, MD;  Location: [facility] ORS;  Service: Orthopedics;  Laterality: Bilateral;  spinal anes. placed in OR    Family History  Problem Relation Age of Onset   Dementia Mother    Heart attack Father    Heart disease Father    Alzheimer's  disease Sister     Social History   Socioeconomic History   Marital status: Married    Spouse name: Not on file   Number of children: 3   Years of education: Not on file   Highest education level: Professional school degree (e.g., MD, DDS, DVM, JD)  Occupational History   Not on file  Tobacco Use   Smoking status: Never   Smokeless tobacco: Never  Vaping Use   Vaping status: Never Used  Substance and Sexual Activity   Alcohol use: Yes    Alcohol/week: 1.0 standard drink of alcohol    Types: 1 Cans of beer per week    Comment: 4 drinks per week   Drug use: No   Sexual activity: Yes  Other Topics Concern   Not on file  Social History Narrative   Married   Social Drivers of Health   Tobacco Use: Low Risk (08/10/2024)   Patient History    Smoking Tobacco Use: Never    Smokeless Tobacco Use: Never    Passive Exposure: Not on file  Financial Resource Strain: Low Risk (08/06/2024)   Overall Financial Resource Strain (CARDIA)    Difficulty of Paying Living Expenses: Not hard at all  Food Insecurity: No Food Insecurity (08/10/2024)   Epic    Worried About Programme Researcher, Broadcasting/film/video in the Last Year: Never true    Ran Out of Food in the Last Year: Never true  Transportation Needs: No Transportation Needs (08/10/2024)   Epic    Lack of Transportation (Medical): No    Lack of Transportation (Non-Medical): No  Physical Activity: Sufficiently Active (08/10/2024)   Exercise Vital Sign    Days of Exercise per Week: 5 days    Minutes of Exercise per Session: 130 min  Stress: Stress Concern Present (08/10/2024)   [employer] of Occupational Health - Occupational Stress Questionnaire    Feeling of Stress: To some extent  Social Connections: Moderately Isolated (08/10/2024)   Social Connection and Isolation Panel    Frequency of Communication with Friends and Family: Once a week    Frequency of Social Gatherings with Friends and Family: Once a week    Attends Religious Services:  Never    Database Administrator or Organizations: Yes    Attends Engineer, Structural: More than 4 times per year    Marital Status: Married  Catering Manager Violence: Not At Risk (08/10/2024)   Epic    Fear of Current or Ex-Partner: No    Emotionally Abused: No    Physically Abused: No    Sexually Abused: No  Depression (PHQ2-9): Low Risk (08/10/2024)   Depression (PHQ2-9)    PHQ-2 Score: 1  Alcohol Screen: Low Risk (08/06/2024)   Alcohol Screen    Last Alcohol Screening Score (AUDIT): 1  Housing: Low Risk (08/10/2024)   Epic    Unable to Pay for Housing in the Last  Year: No    Number of Times Moved in the Last Year: 1    Homeless in the Last Year: No  Utilities: Not At Risk (08/10/2024)   Epic    Threatened with loss of utilities: No  Health Literacy: Adequate Health Literacy (08/10/2024)   B1300 Health Literacy    Frequency of need for help with medical instructions: Never    Outpatient Medications Prior to Visit  Medication Sig Dispense Refill   atorvastatin  (LIPITOR) 10 MG tablet Take 1 tablet (10 mg total) by mouth every morning. 90 tablet 3   cholecalciferol (VITAMIN D ) 1000 units tablet Take 1,000 Units by mouth daily.     Coenzyme Q10 (CO Q-10) 200 MG CAPS Take 200 mg by mouth daily.     ELIQUIS  5 MG TABS tablet TAKE 1 TABLET TWICE A DAY 180 tablet 3   ezetimibe  (ZETIA ) 10 MG tablet TAKE 1 TABLET DAILY 90 tablet 3   ferrous sulfate 325 (65 FE) MG tablet Take 325 mg by mouth daily.     finasteride  (PROSCAR ) 5 MG tablet Take 5 mg by mouth daily.      ibuprofen (ADVIL,MOTRIN) 200 MG tablet Take 400 mg by mouth 2 (two) times daily as needed for moderate pain.     Magnesium Citrate 100 MG CAPS      metoprolol  succinate (TOPROL -XL) 25 MG 24 hr tablet TAKE 1 TABLET TWICE A DAY. MAY TAKE AN ADDITIONAL TABLET DAILY 180 tablet 1   Multiple Vitamin (MULTIVITAMIN WITH MINERALS) TABS tablet Take 1 tablet by mouth daily.     OVER THE COUNTER MEDICATION Take 2 capsules  by mouth every morning. elysium     Polyethyl Glycol-Propyl Glycol (SYSTANE OP) Place 1 drop into both eyes 2 (two) times daily.      Resveratrol 250 MG CAPS Take 250 mg by mouth 2 (two) times daily.     triamcinolone  cream (KENALOG ) 0.1 % Apply 1 application topically as needed (rash).     ramipril  (ALTACE ) 5 MG capsule TAKE 1 CAPSULE DAILY 90 capsule 1   No facility-administered medications prior to visit.    Allergies[1]  ROS Per HPI    Objective:    Physical Exam Constitutional:      General: He is not in acute distress.    Appearance: He is not ill-appearing.  Eyes:     Extraocular Movements: Extraocular movements intact.     Conjunctiva/sclera: Conjunctivae normal.  Cardiovascular:     Rate and Rhythm: Normal rate.  Pulmonary:     Effort: Pulmonary effort is normal.  Musculoskeletal:     Cervical back: Normal range of motion and neck supple.  Skin:    General: Skin is warm and dry.  Neurological:     General: No focal deficit present.     Mental Status: He is alert and oriented to person, place, and time.  Psychiatric:        Mood and Affect: Mood normal.        Behavior: Behavior normal.        Thought Content: Thought content normal.      BP 128/78   Pulse 66   Temp 97.9 F (36.6 C) (Temporal)   Ht 5' 10 (1.778 m)   Wt 191 lb (86.6 kg)   SpO2 97%   BMI 27.41 kg/m  Wt Readings from Last 3 Encounters:  08/10/24 191 lb (86.6 kg)  08/10/24 191 lb 3.2 oz (86.7 kg)  06/03/24 190 lb 9.6 oz (86.5 kg)  Assessment & Plan:   Problem List Items Addressed This Visit     Fatigue   Relevant Orders   For home use only DME continuous positive airway pressure (CPAP)   Fatty liver   OSA (obstructive sleep apnea) - Primary   Relevant Orders   For home use only DME continuous positive airway pressure (CPAP)    Assessment and Plan Assessment & Plan Obstructive sleep apnea Mild obstructive sleep apnea confirmed by home sleep study with 9.4 episodes of  apnea per hour. Symptoms include nocturia and daytime fatigue. No significant snoring or witnessed apneas. He is a side sleeper and does not sleep on his back. Discussed CPAP therapy options and potential benefits such as increased energy and improved cognitive function. He expressed concerns about the inconvenience of CPAP use and cost. Discussed alternative treatments like dental appliances and Inspire device, but he is not interested in surgical options. He is considering CPAP therapy but is hesitant due to potential inconvenience and cost. - Referred to [facility] for CPAP evaluation - Provided information on CPAP options and insurance coverage.  Fatigue Daytime fatigue possibly related to mild obstructive sleep apnea. He reports feeling rested in the morning but experiences fatigue by midday. Engages in regular physical activity, including playing tennis five days a week.   Fatty liver disease Chronic mildly elevated liver enzymes over the past five years. Previous ultrasound showed mild fatty liver. He has seen a gastroenterologist and liver specialist. No alcohol consumption reported, with occasional rare alcohol intake. - Continue monitoring liver function tests. - Maintain current lifestyle modifications, including limited alcohol intake.  Visit time 23 minutes in face to face communication with patient and coordination of care, additional 8 minutes spent in record review, coordination or care, ordering tests, communicating/referring to other healthcare professionals, documenting in medical records all on the same day of the visit for total time 31 minutes spent on the visit.     I am having Anoop Hemmer maintain his finasteride , Polyethyl Glycol-Propyl Glycol (SYSTANE OP), Co Q-10, cholecalciferol, multivitamin with minerals, Resveratrol, ferrous sulfate, ibuprofen, OVER THE COUNTER MEDICATION, triamcinolone  cream, metoprolol  succinate, atorvastatin , Magnesium Citrate, Eliquis ,  and ezetimibe .  No orders of the defined types were placed in this encounter.      [1] No Known Allergies  "

## 2024-08-18 ENCOUNTER — Telehealth: Payer: Self-pay

## 2024-08-18 DIAGNOSIS — G4733 Obstructive sleep apnea (adult) (pediatric): Secondary | ICD-10-CM

## 2024-08-18 DIAGNOSIS — R5383 Other fatigue: Secondary | ICD-10-CM

## 2024-08-18 NOTE — Telephone Encounter (Signed)
 Ok to add on heated humidifier to CPAP prescription?

## 2024-08-18 NOTE — Telephone Encounter (Signed)
 Copied from CRM [id number]. Topic: Clinical - Prescription Issue >> Aug 18, 2024  1:11 PM Deleta RAMAN wrote: Reason for CRM: in order to receive cpap machine patient will need heated humidifier added to prescription. Reported by ronda [facility] [phone]. Fax [phone]

## 2024-08-18 NOTE — Addendum Note (Signed)
 Addended by: Gerrica Cygan E on: 08/18/2024 02:27 PM   Modules accepted: Orders

## 2024-08-18 NOTE — Telephone Encounter (Signed)
 Placed new CPAP order with heated humidifier added. Faxed to number provided.
